# Patient Record
Sex: Female | Born: 1960 | Race: White | Hispanic: No | Smoking: Current every day smoker
Health system: Southern US, Community
[De-identification: ages and names within clinical notes are randomized; demographics above are authoritative.]

## PROBLEM LIST (undated history)

## (undated) DIAGNOSIS — G43909 Migraine, unspecified, not intractable, without status migrainosus: Secondary | ICD-10-CM

## (undated) DIAGNOSIS — I10 Essential (primary) hypertension: Secondary | ICD-10-CM

## (undated) DIAGNOSIS — E785 Hyperlipidemia, unspecified: Secondary | ICD-10-CM

## (undated) HISTORY — DX: Essential (primary) hypertension: I10

## (undated) HISTORY — DX: Migraine, unspecified, not intractable, without status migrainosus: G43.909

## (undated) HISTORY — DX: Hyperlipidemia, unspecified: E78.5

## (undated) HISTORY — PX: TUBAL LIGATION: SHX77

## (undated) HISTORY — PX: CHOLECYSTECTOMY: SHX55

---

## 1999-05-07 ENCOUNTER — Other Ambulatory Visit: Admission: RE | Admit: 1999-05-07 | Discharge: 1999-05-07 | Payer: Self-pay | Admitting: Family Medicine

## 2003-02-02 ENCOUNTER — Other Ambulatory Visit: Admission: RE | Admit: 2003-02-02 | Discharge: 2003-02-02 | Payer: Self-pay | Admitting: Family Medicine

## 2008-08-25 ENCOUNTER — Ambulatory Visit (HOSPITAL_COMMUNITY): Admission: RE | Admit: 2008-08-25 | Discharge: 2008-08-25 | Payer: Self-pay | Admitting: Family Medicine

## 2010-02-27 ENCOUNTER — Ambulatory Visit (HOSPITAL_COMMUNITY): Admission: RE | Admit: 2010-02-27 | Discharge: 2010-02-27 | Payer: Self-pay | Admitting: Family Medicine

## 2010-10-07 ENCOUNTER — Emergency Department (HOSPITAL_COMMUNITY): Payer: BC Managed Care – PPO

## 2010-10-07 ENCOUNTER — Observation Stay (HOSPITAL_COMMUNITY)
Admission: EM | Admit: 2010-10-07 | Discharge: 2010-10-08 | Disposition: A | Discharge status: Home / Self Care | Payer: BC Managed Care – PPO | Attending: Internal Medicine | Admitting: Internal Medicine

## 2010-10-07 DIAGNOSIS — F17200 Nicotine dependence, unspecified, uncomplicated: Secondary | ICD-10-CM | POA: Insufficient documentation

## 2010-10-07 DIAGNOSIS — E860 Dehydration: Secondary | ICD-10-CM | POA: Insufficient documentation

## 2010-10-07 DIAGNOSIS — R0602 Shortness of breath: Secondary | ICD-10-CM | POA: Insufficient documentation

## 2010-10-07 DIAGNOSIS — R0789 Other chest pain: Principal | ICD-10-CM | POA: Insufficient documentation

## 2010-10-07 DIAGNOSIS — Z8249 Family history of ischemic heart disease and other diseases of the circulatory system: Secondary | ICD-10-CM | POA: Insufficient documentation

## 2010-10-07 LAB — COMPREHENSIVE METABOLIC PANEL
AST: 20 U/L (ref 0–37)
BUN: 17 mg/dL (ref 6–23)
CO2: 24 mEq/L (ref 19–32)
Calcium: 9.2 mg/dL (ref 8.4–10.5)
Chloride: 109 mEq/L (ref 96–112)
Glucose, Bld: 101 mg/dL — ABNORMAL HIGH (ref 70–99)
Potassium: 3.9 mEq/L (ref 3.5–5.1)
Sodium: 140 mEq/L (ref 135–145)
Total Protein: 6.7 g/dL (ref 6.0–8.3)

## 2010-10-07 LAB — DIFFERENTIAL
Basophils Absolute: 0 10*3/uL (ref 0.0–0.1)
Eosinophils Absolute: 0.3 10*3/uL (ref 0.0–0.7)
Lymphocytes Relative: 33 % (ref 12–46)
Monocytes Absolute: 0.4 10*3/uL (ref 0.1–1.0)
Neutro Abs: 4.6 10*3/uL (ref 1.7–7.7)

## 2010-10-07 LAB — CBC
HCT: 42.6 % (ref 36.0–46.0)
MCHC: 34.7 g/dL (ref 30.0–36.0)

## 2010-10-07 LAB — POCT CARDIAC MARKERS: CKMB, poc: 1.5 ng/mL (ref 1.0–8.0)

## 2010-10-07 NOTE — H&P (Signed)
NAMEWILBURTA, [name]             ACCOUNT NO.:  [id number]  MEDICAL RECORD NO.:  [id number]           PATIENT TYPE:  E  LOCATION:  [facility]                          FACILITY:  [facility]  PHYSICIAN:  Talmage Nap, MD  DATE OF BIRTH:  1961-03-16  DATE OF ADMISSION:  10/07/2010 DATE OF DISCHARGE:  LH                             HISTORY & PHYSICAL   PRIMARY CARE PHYSICIAN:  Unassigned.  History is obtainable from the patient and the patient's son.  CHIEF COMPLAINT:  Chest pain, on and off for about 2 weeks' duration.  The patient is a 50 year old Caucasian female with no known medical history, but has a history of chronic tobacco use, presenting to the emergency room with chest pain, which according to the patient is being on and off for about 2 weeks.  This however got worse 24 hours prior to presenting to the emergency room.  The chest pain is described as pressure like, about 7/10 in intensity and located in the retrosternal region. This was said to be periodically radiating to the left arm.  The patient claims she was mildly short of breath.  She denied any history of nausea or vomiting.  No fever.  No chills or rigor.  She also denies any history of palpitation, PND or orthopnea.  Pain is made worse by movement and relieved by rest.  Pain was said to have persisted hence the patient presented to the emergency room to be evaluated.  PAST MEDICAL HISTORY:  None.  PAST SURGICAL HISTORY:  Tubal ligation and laparoscopic cholecystectomy.  MEDICATIONS:  She has no known preadmission med.  ALLERGIES:  IV dye.  SOCIAL HISTORY:  The patient smokes about a pack of cigarettes per day for the past 30 years.  Denies any history of alcohol use and works three jobs i.e. Science writer, CNA, and also in a parking industry.  FAMILY HISTORY:  Father had massive heart attack at a very early age, which he survived.  There is no family history of diabetes mellitus, hypertension, or  hyperlipidemia.  REVIEW OF SYSTEMS:  The patient denies any history of headaches.  No blurred vision.  Complained about dryness of the mouth.  No nausea or vomiting.  No fever with chills or rigor.  Chest pain is less in intensity but reproducible on the right side.  Denies any palpitation,PND or orthopnea.No cough. No abdominal discomfort.  No diarrhea or hematochezia.  No dysuria or hematuria.  No swelling of the lower extremity.  No intolerance to heat or cold.  No known psychiatric disorder.  PHYSICAL EXAMINATION:  GENERAL: , very pleasant young lady  dehydrated, not in any respiratory distress. VITAL SIGNS:  Blood pressure is 113/77, pulse is 69, respiratory rate is 22, temperature is 98.2. HEENT:  Pupils are reactive to light.  Extraocular muscles are intact. NECK:  No jugular venous distention.  No carotid bruit.  No lymphadenopathy. CHEST:  Clear to auscultation, but there is reproducible tenderness in the right hemithorax. HEART:  Sounds are 1 and 2.  No murmur. ABDOMEN:  Soft, nontender.  Liver, spleen, and kidney are not palpable.Bowel sounds are positive. EXTREMITIES:  No  pedal edema. NEUROLOGIC:  Nonfocal. MUSCULOSKELETAL SYSTEM:  Unremarkable. SKIN:  Shows slightly decreased turgor.  LAB DATA:  Initial hematological indices showed WBC of 8.0, hemoglobin of 14.8, hematocrit of 42.6, MCV of 88.2, with a platelet count of 248, normal differential.  Chemistries show sodium of 140, potassium of 3.9, chloride of 109, with a bicarb of 24, glucose is 101, BUN is 17, creatinine 0.76.  LFTs normal.  First set of cardiac markers, troponin-I less than 0.05.  Chest x-ray essentially normal.  EKG showed normal sinus rhythm with a rate of 73, no acute ST-T-wave changes noted.  IMPRESSION: 1. Chest pain most likely musculoskeletal since it is reproducible,     however, it is important to rule out acute coronary syndrome. 2. Dehydration. 3. Chronic tobacco use. 4. Family history  of early coronary artery disease.  PLAN:  To admit the patient to telemetry for observation.  The patient will be slowly hydrated with half-normal saline IV to go at a rate of 75 mL an hour.  She will be on aspirin 325 mg p.o. daily, nitroglycerin 0.4 mg sublingual p.r.n., and pain control be with morphine 2 mg IV q.4 p.r.n.  She will also be on O2 via nasal cannula three times per minute and she will be given nicotine patch 14 mg q.24 h. transdermal.  The patient will also be on Flexeril 10 mg p.o. nightly p.r.n. for chest pain and Protonix 40 mg p.o. daily for GI prophylaxis and Lovenox 40 mg subcu q. 24 for DVT prophylaxis.  Further labs to be ordered on this patient will include cardiac enzymes q.6 x3, lipid panel, 2-D echo, CBCD, CMP, and magnesium will be repeated in a.m.  Finally, if cardiac enzymes are negative, the patient could have Cardiolite stress test as an outpatient.  She will be followed and evaluated on daily basis.     Talmage Nap, MD     CN/MEDQ  D:  10/07/2010  T:  10/07/2010  Job:  [id number]  Electronically Signed by Talmage Nap  on 10/07/2010 11:39:55 PM

## 2010-10-08 LAB — CK TOTAL AND CKMB (NOT AT ARMC)
CK, MB: 2.2 ng/mL (ref 0.3–4.0)
Relative Index: INVALID (ref 0.0–2.5)
Total CK: 73 U/L (ref 7–177)

## 2010-10-08 LAB — MAGNESIUM: Magnesium: 2.1 mg/dL (ref 1.5–2.5)

## 2010-10-08 LAB — COMPREHENSIVE METABOLIC PANEL
AST: 23 U/L (ref 0–37)
Alkaline Phosphatase: 47 U/L (ref 39–117)
CO2: 22 mEq/L (ref 19–32)
Potassium: 4.2 mEq/L (ref 3.5–5.1)
Sodium: 140 mEq/L (ref 135–145)
Total Bilirubin: 0.3 mg/dL (ref 0.3–1.2)

## 2010-10-08 LAB — DIFFERENTIAL
Basophils Absolute: 0 10*3/uL (ref 0.0–0.1)
Basophils Relative: 1 % (ref 0–1)
Eosinophils Absolute: 0.3 10*3/uL (ref 0.0–0.7)
Lymphocytes Relative: 37 % (ref 12–46)
Lymphs Abs: 2.7 10*3/uL (ref 0.7–4.0)
Monocytes Relative: 5 % (ref 3–12)
Neutro Abs: 3.9 10*3/uL (ref 1.7–7.7)
Neutrophils Relative %: 53 % (ref 43–77)

## 2010-10-08 LAB — CBC
HCT: 40 % (ref 36.0–46.0)
Hemoglobin: 13.5 g/dL (ref 12.0–15.0)
MCH: 30 pg (ref 26.0–34.0)
MCV: 88.9 fL (ref 78.0–100.0)
RBC: 4.5 MIL/uL (ref 3.87–5.11)
WBC: 7.3 10*3/uL (ref 4.0–10.5)

## 2010-10-08 LAB — CARDIAC PANEL(CRET KIN+CKTOT+MB+TROPI)
CK, MB: 2.1 ng/mL (ref 0.3–4.0)
Relative Index: INVALID (ref 0.0–2.5)
Troponin I: 0.01 ng/mL (ref 0.00–0.06)

## 2010-10-08 LAB — LIPID PANEL
Cholesterol: 127 mg/dL (ref 0–200)
HDL: 24 mg/dL — ABNORMAL LOW (ref >39)
LDL Cholesterol: 64 mg/dL (ref 0–99)
VLDL: 39 mg/dL (ref 0–40)

## 2010-10-10 NOTE — Discharge Summary (Signed)
[name], [name]             ACCOUNT NO.:  [id number]  MEDICAL RECORD NO.:  [id number]           PATIENT TYPE:  O  LOCATION:  [facility]                          FACILITY:  [facility]  PHYSICIAN:  Elliot Cousin, M.D.    DATE OF BIRTH:  07-16-1960  DATE OF ADMISSION:  10/07/2010 DATE OF DISCHARGE:  04/02/2012LH                              DISCHARGE SUMMARY   DISCHARGE DIAGNOSES: 1. Chest pain, likely secondary to musculoskeletal pain, myocardial     infarction ruled out. 2. Tobacco abuse.  DISCHARGE MEDICATIONS: 1. Flexeril 10 mg 1 tablet b.i.d. p.r.n. for muscle spasm and pain. 2. Ibuprofen 200 mg 1-2 tablets every 6 hours as needed for pain. 3. Prilosec OTC 1 tablet daily. 4. Tylenol 325 mg 1-2 tablets every 6 hours as needed for pain.  DISCHARGE DISPOSITION:  The patient was discharged to home in improved and stable condition on October 08, 2010.  An appointment with the community physician on call, Dr. Renard Carr, will be arranged for the patient to follow up with him in 1-2 weeks.  CONSULTATIONS:  None.  PROCEDURE PERFORMED:  Chest x-ray on October 07, 2010.  The results revealed low lung volumes.  No significant abnormality identified.  HISTORY OF PRESENT ILLNESS:  The patient is a 50 year old woman with a past medical history significant for tobacco use, who presented to the emergency department on October 07, 2010 with a chief complaint of chest pain for approximately 2 weeks.  When she was initially evaluated, she was noted to be afebrile and hemodynamically stable, although mildly hypertensive.  Her chest x-ray revealed no acute cardiopulmonary abnormalities.  Her initial cardiac markers were negative.  Her EKG revealed normal sinus rhythm with a heart rate of 73 beats per minute and no abnormalities whatsoever.  She was admitted for further evaluation and management.  HOSPITAL COURSE:  The patient was started on p.r.n. morphine and p.r.n. Flexeril.  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  On exam, she did have some pectoris major tenderness upon palpation.  There was no associated edema or erythema.  In addition to the morphine and Flexeril, Protonix was added just in case she had a flare-up of gastroesophageal reflux disease.  She does admit to a history of heartburn and reflux, but takes over-the- counter Pepcid as needed.  Sublingual nitroglycerin was also ordered, however, was not needed.  A nicotine patch was placed as the patient smokes half a pack to a pack of cigarettes per day.  She was strongly advised to stop smoking.  Aspirin at 325 mg daily was added prophylactically, but was discontinued at the time of hospital discharge.  For further evaluation, cardiac enzymes, 2-D echocardiogram, and a fasting lipid panel were ordered.  All of her cardiac enzymes were normal and therefore she ruled out for myocardial infarction.  The results of the fasting lipid panel were pending at the time of hospital discharge.  The 2-D echocardiogram was cancelled as it appeared that the patient's pain was noncardiac in origin.  The patient remained hemodynamically stable.  Her blood pressure normalized to 126/78.  Her electrolyte  panel and CBC as well as her liver transaminases were all well within normal limits.  She was discharged home on as-needed Flexeril, as-needed ibuprofen, as-needed Tylenol, and Prilosec OTC daily.     Elliot Cousin, M.D.     DF/MEDQ  D:  10/08/2010  T:  10/09/2010  Job:  [id number]  cc:   Linda G. Renard Matter, MD Fax: [phone]  Electronically Signed by Elliot Cousin M.D. on 10/10/2010 05:12:47 PM

## 2012-10-14 ENCOUNTER — Telehealth: Payer: Self-pay | Admitting: Nurse Practitioner

## 2012-10-14 NOTE — Telephone Encounter (Signed)
Please advise 

## 2012-10-14 NOTE — Telephone Encounter (Signed)
Crestor sample ok if we have

## 2012-10-15 ENCOUNTER — Ambulatory Visit (INDEPENDENT_AMBULATORY_CARE_PROVIDER_SITE_OTHER): Payer: BC Managed Care – PPO | Admitting: Nurse Practitioner

## 2012-10-15 ENCOUNTER — Encounter: Payer: Self-pay | Admitting: Nurse Practitioner

## 2012-10-15 VITALS — BP 123/89 | HR 89 | Temp 97.9°F | Ht 60.0 in | Wt 132.0 lb

## 2012-10-15 DIAGNOSIS — J069 Acute upper respiratory infection, unspecified: Secondary | ICD-10-CM

## 2012-10-15 DIAGNOSIS — E785 Hyperlipidemia, unspecified: Secondary | ICD-10-CM

## 2012-10-15 DIAGNOSIS — G43909 Migraine, unspecified, not intractable, without status migrainosus: Secondary | ICD-10-CM | POA: Insufficient documentation

## 2012-10-15 DIAGNOSIS — I10 Essential (primary) hypertension: Secondary | ICD-10-CM

## 2012-10-15 MED ORDER — AMOXICILLIN 875 MG PO TABS: 875.0000 mg | ORAL_TABLET | Freq: Two times a day (BID) | ORAL | Status: DC

## 2012-10-15 MED ORDER — SUMATRIPTAN SUCCINATE 50 MG PO TABS: 50.0000 mg | ORAL_TABLET | ORAL | Status: DC | PRN

## 2012-10-15 MED ORDER — HYDROCODONE-HOMATROPINE 5-1.5 MG/5ML PO SYRP: 5.0000 mL | ORAL_SOLUTION | Freq: Three times a day (TID) | ORAL | Status: DC | PRN

## 2012-10-15 NOTE — Progress Notes (Signed)
  Subjective:    Patient ID: [name], female    DOB: 1961/03/14, 52 y.o.   MRN: [id number]  HPI-Patient in complaining of cough and congestion . Started 5week. Has gotten worse since started. Associated symptoms include Slight headache. He has tried Claritin , nyquil and dayquil OTC without relief. Cough all night last night at work.     Review of Systems  Constitutional: Positive for fatigue. Negative for fever and chills.  HENT: Positive for congestion, rhinorrhea and sinus pressure.   Respiratory: Positive for cough (nonproductive).   Cardiovascular: Negative.   Gastrointestinal: Negative.   Psychiatric/Behavioral: Negative.        Objective:   Physical Exam  Constitutional: She is oriented to person, place, and time. She appears well-developed and well-nourished.  HENT:  Head: Normocephalic.  Right Ear: External ear normal.  Left Ear: External ear normal.  Nose: Mucosal edema and rhinorrhea present.  Eyes: Pupils are equal, round, and reactive to light.  Neck: Normal range of motion. Neck supple. No thyromegaly present.  Cardiovascular: Normal rate, normal heart sounds and intact distal pulses.   Pulmonary/Chest: Effort normal. She has no wheezes. She has no rales.  Lymphadenopathy:    She has no cervical adenopathy.  Neurological: She is alert and oriented to person, place, and time.  Skin: Skin is warm and dry.  Psychiatric: She has a normal mood and affect. Her behavior is normal. Judgment and thought content normal.  BP 123/89  Pulse 89  Temp(Src) 97.9 F (36.6 C) (Oral)  Ht 5' (1.524 m)  Wt 132 lb (59.875 kg)  BMI 25.78 kg/m2  LMP 07/17/2012         Assessment & Plan:  Upper respiratory infection Amoxicillin as RX Hycodan cough meds 1. Take meds as prescribed 2. Use a cool mist humidifier especially during the winter months and when heat has  been humid. 3. Use saline nose sprays frequently 4. Saline irrigations of the nose can be very helpful  if done frequently.  * 4X daily for 1 week*  * Use of a nettie pot can be helpful with this. Follow directions with this* 5. Drink plenty of fluids 6. Keep thermostat turn down low 7.For any cough or congestion  Use plain Mucinex- regular strength or max strength is fine   * Children- consult with Pharmacist for dosing 8. For fever or aces or pains- take tylenol or ibuprofen appropriate for age and weight.  * for fevers greater than 101 orally you may alternate ibuprofen and tylenol every  3 hours.   Mary-Margaret Daphine Deutscher, FNP

## 2012-10-15 NOTE — Telephone Encounter (Signed)
Samples up front 

## 2012-10-15 NOTE — Patient Instructions (Signed)

## 2013-07-14 ENCOUNTER — Other Ambulatory Visit: Payer: Self-pay | Admitting: Nurse Practitioner

## 2013-07-14 ENCOUNTER — Telehealth: Payer: Self-pay | Admitting: Nurse Practitioner

## 2013-07-14 MED ORDER — LISINOPRIL 10 MG PO TABS: 10.0000 mg | ORAL_TABLET | Freq: Every day | ORAL | Status: DC

## 2013-07-14 NOTE — Telephone Encounter (Signed)
Please schedule

## 2013-07-14 NOTE — Telephone Encounter (Signed)
Needs appointment- just make one in the next few weeks and call and leave her message on voice mail as to date and time- thank you!

## 2013-07-15 NOTE — Telephone Encounter (Signed)
[facility] but [facility] number disconnected

## 2013-07-21 ENCOUNTER — Ambulatory Visit: Payer: BC Managed Care – PPO | Admitting: Nurse Practitioner

## 2013-10-01 ENCOUNTER — Encounter: Payer: Self-pay | Admitting: Family Medicine

## 2013-10-01 ENCOUNTER — Ambulatory Visit (INDEPENDENT_AMBULATORY_CARE_PROVIDER_SITE_OTHER): Payer: BC Managed Care – PPO | Admitting: Family Medicine

## 2013-10-01 VITALS — BP 142/92 | HR 78 | Temp 98.7°F | Ht 60.0 in | Wt 140.2 lb

## 2013-10-01 DIAGNOSIS — G43909 Migraine, unspecified, not intractable, without status migrainosus: Secondary | ICD-10-CM

## 2013-10-01 MED ORDER — SUMATRIPTAN SUCCINATE 100 MG PO TABS: 100.0000 mg | ORAL_TABLET | ORAL | Status: DC | PRN

## 2013-10-01 MED ORDER — KETOROLAC TROMETHAMINE 30 MG/ML IJ SOLN: 30.0000 mg | Freq: Once | INTRAMUSCULAR | Status: AC

## 2013-10-01 MED ORDER — PROMETHAZINE HCL 25 MG PO TABS: 25.0000 mg | ORAL_TABLET | Freq: Three times a day (TID) | ORAL | Status: DC | PRN

## 2013-10-01 MED ORDER — KETOROLAC TROMETHAMINE 60 MG/2ML IM SOLN
30.0000 mg | Freq: Once | INTRAMUSCULAR | Status: AC
  Administered 2013-10-01: 30 mg via INTRAMUSCULAR

## 2013-10-01 NOTE — Progress Notes (Signed)
   Subjective:    Patient ID: [name], female    DOB: 06-Nov-1960, 53 y.o.   MRN: [id number]  HPI  This 53 y.o. female presents for evaluation of migraine headache today and had to go home early from work.  Review of Systems C/o headache   No chest pain, SOB, HA, dizziness, vision change, N/V, diarrhea, constipation, dysuria, urinary urgency or frequency, myalgias, arthralgias or rash.  Objective:   Physical Exam  Vital signs noted  Well developed well nourished female.  HEENT - Head atraumatic Normocephalic                Eyes - PERRLA, Conjuctiva - clear Sclera- Clear EOMI                Ears - EAC's Wnl TM's Wnl Gross Hearing WNL                Throat - oropharanx wnl Respiratory - Lungs CTA bilateral Cardiac - RRR S1 and S2 without murmur GI - Abdomen soft Nontender and bowel sounds active x 4 Extremities - No edema. Neuro - Grossly intact.      Assessment & Plan:  Migraine - Plan: ketorolac (TORADOL) 30 MG/ML injection 30 mg, SUMAtriptan (IMITREX) 100 MG tablet, promethazine (PHENERGAN) 25 MG tablet, ketorolac (TORADOL) 30 MG/ML injection 30 mg, ketorolac (TORADOL) injection 30 mg  Deatra CanterWilliam J Kameko Hukill FNP

## 2013-11-23 ENCOUNTER — Ambulatory Visit (INDEPENDENT_AMBULATORY_CARE_PROVIDER_SITE_OTHER): Payer: BC Managed Care – PPO | Admitting: Family

## 2013-11-23 VITALS — BP 126/88 | HR 76 | Temp 98.1°F | Ht 60.0 in | Wt 138.0 lb

## 2013-11-23 DIAGNOSIS — N760 Acute vaginitis: Secondary | ICD-10-CM

## 2013-11-23 DIAGNOSIS — B3731 Acute candidiasis of vulva and vagina: Secondary | ICD-10-CM

## 2013-11-23 DIAGNOSIS — N939 Abnormal uterine and vaginal bleeding, unspecified: Secondary | ICD-10-CM

## 2013-11-23 DIAGNOSIS — B373 Candidiasis of vulva and vagina: Secondary | ICD-10-CM

## 2013-11-23 DIAGNOSIS — A499 Bacterial infection, unspecified: Secondary | ICD-10-CM

## 2013-11-23 DIAGNOSIS — B9689 Other specified bacterial agents as the cause of diseases classified elsewhere: Secondary | ICD-10-CM

## 2013-11-23 DIAGNOSIS — N898 Other specified noninflammatory disorders of vagina: Secondary | ICD-10-CM

## 2013-11-23 LAB — POCT WET PREP WITH KOH
Clue Cells Wet Prep HPF POC: NEGATIVE
KOH PREP POC: POSITIVE
TRICHOMONAS UA: NEGATIVE

## 2013-11-23 MED ORDER — METRONIDAZOLE 500 MG PO TABS: 500.0000 mg | ORAL_TABLET | Freq: Two times a day (BID) | ORAL | Status: DC

## 2013-11-23 MED ORDER — BUTOCONAZOLE NITRATE (1 DOSE) 2 % VA CREA: 5.0000 g | TOPICAL_CREAM | Freq: Every day | VAGINAL | Status: DC

## 2013-11-23 NOTE — Progress Notes (Signed)
   Subjective:    Patient ID: Linda ReiningMichelle A Covel, female    DOB: April 10, 1961, 53 y.o.   MRN: [id number]  Vaginal Bleeding The patient's primary symptoms include vaginal bleeding. The patient's pertinent negatives include no genital itching, genital lesions, genital odor, pelvic pain or vaginal discharge. This is a new problem. Episode onset: two weeks ago. The problem occurs constantly ("light bleediing everyday for the last two weeks"). The problem has been unchanged. The patient is experiencing no pain. She is not pregnant. Pertinent negatives include no abdominal pain, discolored urine, fever or vomiting. The vaginal discharge was bloody ("pink"). The vaginal bleeding is lighter than menses. She has not been passing clots. Nothing aggravates the symptoms. She has tried nothing for the symptoms. She is not sexually active. Her menstrual history has been irregular. Her past medical history is significant for endometriosis. There is no history of an abdominal surgery or an STD.      Review of Systems  Constitutional: Negative for fever.  HENT: Negative.   Respiratory: Negative.   Cardiovascular: Negative.   Gastrointestinal: Negative for vomiting and abdominal pain.  Genitourinary: Positive for vaginal bleeding. Negative for vaginal discharge and pelvic pain.  Musculoskeletal: Negative.   Neurological: Negative.   Hematological: Negative.   Psychiatric/Behavioral: Negative.   All other systems reviewed and are negative.      Objective:   Physical Exam  Constitutional: She is oriented to person, place, and time. She appears well-developed and well-nourished.  Cardiovascular: Normal rate, regular rhythm, normal heart sounds and intact distal pulses.   No murmur heard. Pulmonary/Chest: Effort normal and breath sounds normal. No respiratory distress. She has no wheezes.  Abdominal: Soft. Bowel sounds are normal. She exhibits no distension. There is no tenderness.  Musculoskeletal: Normal  range of motion.  Neurological: She is alert and oriented to person, place, and time.  Skin: Skin is warm and dry. No erythema.  Psychiatric: She has a normal mood and affect. Her behavior is normal. Judgment and thought content normal.   Results for orders placed in visit on 11/23/13  POCT WET PREP WITH KOH      Result Value Ref Range   Trichomonas, UA Negative     Clue Cells Wet Prep HPF POC neg     Epithelial Wet Prep HPF POC mod     Yeast Wet Prep HPF POC mod     Bacteria Wet Prep HPF POC mod     RBC Wet Prep HPF POC 1-5     WBC Wet Prep HPF POC 5-10     KOH Prep POC Positive        BP 126/88  Pulse 76  Temp(Src) 98.1 F (36.7 C) (Oral)  Ht 5' (1.524 m)  Wt 138 lb (62.596 kg)  BMI 26.95 kg/m2  LMP 07/17/2012     Assessment & Plan:  1. Vaginal bleeding - POCT Wet Prep with KOH  2. Bacterial vaginosis -Keep vagina clean and dry -Take rx as prescribed - metroNIDAZOLE (FLAGYL) 500 MG tablet; Take 1 tablet (500 mg total) by mouth 2 (two) times daily.  Dispense: 14 tablet; Refill: 0  3. Candidiasis of vagina -Keep area clean and dry -Wear cotton underwear - Butoconazole Nitrate, 1 Dose, 2 % CREA; Place 5 g vaginally at bedtime.  Dispense: 15 g; Refill: 2  Jannifer Rodneyhristy Sydney Azure, FNP

## 2013-11-23 NOTE — Patient Instructions (Addendum)
Monilial Vaginitis Vaginitis in a soreness, swelling and redness (inflammation) of the vagina and vulva. Monilial vaginitis is not a sexually transmitted infection. CAUSES  Yeast vaginitis is caused by yeast (candida) that is normally found in your vagina. With a yeast infection, the candida has overgrown in number to a point that upsets the chemical balance. SYMPTOMS   White, thick vaginal discharge.  Swelling, itching, redness and irritation of the vagina and possibly the lips of the vagina (vulva).  Burning or painful urination.  Painful intercourse. DIAGNOSIS  Things that may contribute to monilial vaginitis are:  Postmenopausal and virginal states.  Pregnancy.  Infections.  Being tired, sick or stressed, especially if you had monilial vaginitis in the past.  Diabetes. Good control will help lower the chance.  Birth control pills.  Tight fitting garments.  Using bubble bath, feminine sprays, douches or deodorant tampons.  Taking certain medications that kill germs (antibiotics).  Sporadic recurrence can occur if you become ill. TREATMENT  Your caregiver will give you medication.  There are several kinds of anti monilial vaginal creams and suppositories specific for monilial vaginitis. For recurrent yeast infections, use a suppository or cream in the vagina 2 times a week, or as directed.  Anti-monilial or steroid cream for the itching or irritation of the vulva may also be used. Get your caregiver's permission.  Painting the vagina with methylene blue solution may help if the monilial cream does not work.  Eating yogurt may help prevent monilial vaginitis. HOME CARE INSTRUCTIONS   Finish all medication as prescribed.  Do not have sex until treatment is completed or after your caregiver tells you it is okay.  Take warm sitz baths.  Do not douche.  Do not use tampons, especially scented ones.  Wear cotton underwear.  Avoid tight pants and panty  hose.  Tell your sexual partner that you have a yeast infection. They should go to their caregiver if they have symptoms such as mild rash or itching.  Your sexual partner should be treated as well if your infection is difficult to eliminate.  Practice safer sex. Use condoms.  Some vaginal medications cause latex condoms to fail. Vaginal medications that harm condoms are:  Cleocin cream.  Butoconazole (Femstat).  Terconazole (Terazol) vaginal suppository.  Miconazole (Monistat) (may be purchased over the counter). SEEK MEDICAL CARE IF:   You have a temperature by mouth above 102 F (38.9 C).  The infection is getting worse after 2 days of treatment.  The infection is not getting better after 3 days of treatment.  You develop blisters in or around your vagina.  You develop vaginal bleeding, and it is not your menstrual period.  You have pain when you urinate.  You develop intestinal problems.  You have pain with sexual intercourse. Document Released: 04/03/2005 Document Revised: 09/16/2011 Document Reviewed: 12/16/2008 ExitCare Patient Information 2014 [employer]. Bacterial Vaginosis Bacterial vaginosis is a vaginal infection that occurs when the normal balance of bacteria in the vagina is disrupted. It results from an overgrowth of certain bacteria. This is the most common vaginal infection in women of childbearing age. Treatment is important to prevent complications, especially in pregnant women, as it can cause a premature delivery. CAUSES  Bacterial vaginosis is caused by an increase in harmful bacteria that are normally present in smaller amounts in the vagina. Several different kinds of bacteria can cause bacterial vaginosis. However, the reason that the condition develops is not fully understood. RISK FACTORS Certain activities or behaviors can put   you at an increased risk of developing bacterial vaginosis, including:  Having a new sex partner or multiple sex  partners.  Douching.  Using an intrauterine device (IUD) for contraception. Women do not get bacterial vaginosis from toilet seats, bedding, swimming pools, or contact with objects around them. SIGNS AND SYMPTOMS  Some women with bacterial vaginosis have no signs or symptoms. Common symptoms include:  Grey vaginal discharge.  A fishlike odor with discharge, especially after sexual intercourse.  Itching or burning of the vagina and vulva.  Burning or pain with urination. DIAGNOSIS  Your health care provider will take a medical history and examine the vagina for signs of bacterial vaginosis. A sample of vaginal fluid may be taken. Your health care provider will look at this sample under a microscope to check for bacteria and abnormal cells. A vaginal pH test may also be done.  TREATMENT  Bacterial vaginosis may be treated with antibiotic medicines. These may be given in the form of a pill or a vaginal cream. A second round of antibiotics may be prescribed if the condition comes back after treatment.  HOME CARE INSTRUCTIONS   Only take over-the-counter or prescription medicines as directed by your health care provider.  If antibiotic medicine was prescribed, take it as directed. Make sure you finish it even if you start to feel better.  Do not have sex until treatment is completed.  Tell all sexual partners that you have a vaginal infection. They should see their health care provider and be treated if they have problems, such as a mild rash or itching.  Practice safe sex by using condoms and only having one sex partner. SEEK MEDICAL CARE IF:   Your symptoms are not improving after 3 days of treatment.  You have increased discharge or pain.  You have a fever. MAKE SURE YOU:   Understand these instructions.  Will watch your condition.  Will get help right away if you are not doing well or get worse. FOR MORE INFORMATION  Centers for Disease Control and Prevention, Division of  STD Prevention: www.cdc.gov/std American Sexual Health Association (ASHA): www.ashastd.org  Document Released: 06/24/2005 Document Revised: 04/14/2013 Document Reviewed: 02/03/2013 Exit[employer] Patient Information 2014 [employer].  

## 2013-11-24 ENCOUNTER — Telehealth: Payer: Self-pay | Admitting: Nurse Practitioner

## 2013-11-25 NOTE — Telephone Encounter (Signed)
, °

## 2013-11-26 ENCOUNTER — Other Ambulatory Visit: Payer: Self-pay | Admitting: Family

## 2013-11-26 MED ORDER — FLUCONAZOLE 150 MG PO TABS: 150.0000 mg | ORAL_TABLET | Freq: Once | ORAL | Status: DC

## 2014-12-09 ENCOUNTER — Ambulatory Visit (INDEPENDENT_AMBULATORY_CARE_PROVIDER_SITE_OTHER): Payer: BLUE CROSS/BLUE SHIELD | Admitting: Family

## 2014-12-09 ENCOUNTER — Encounter: Payer: Self-pay | Admitting: Family

## 2014-12-09 VITALS — BP 125/85 | HR 80 | Temp 98.9°F | Ht 60.0 in | Wt 125.0 lb

## 2014-12-09 DIAGNOSIS — Z0000 Encounter for general adult medical examination without abnormal findings: Secondary | ICD-10-CM | POA: Diagnosis not present

## 2014-12-09 DIAGNOSIS — E785 Hyperlipidemia, unspecified: Secondary | ICD-10-CM

## 2014-12-09 DIAGNOSIS — I10 Essential (primary) hypertension: Secondary | ICD-10-CM

## 2014-12-09 NOTE — Patient Instructions (Signed)

## 2014-12-09 NOTE — Progress Notes (Signed)
   Subjective:    Patient ID: [name], female    DOB: 1960/11/13, 54 y.o.   MRN: [id number]  Pt presents to the office today for CPE. Pt states she has had HTN and hyperlipidemia in the past, but pt does not currently take any medications at this time.  Hypertension This is a chronic problem. The current episode started more than 1 year ago. The problem is unchanged. The problem is uncontrolled. Pertinent negatives include no anxiety, headaches, palpitations, peripheral edema or shortness of breath. Risk factors for coronary artery disease include dyslipidemia, post-menopausal state and family history. Past treatments include nothing. The current treatment provides no improvement. There is no history of kidney disease, CAD/MI, CVA, heart failure or a thyroid problem. There is no history of sleep apnea.  Hyperlipidemia This is a chronic problem. The current episode started more than 1 year ago. The problem is uncontrolled. Recent lipid tests were reviewed and are high. She has no history of diabetes or hypothyroidism. Pertinent negatives include no leg pain or shortness of breath. Current antihyperlipidemic treatment includes statins. The current treatment provides significant improvement of lipids. Risk factors for coronary artery disease include dyslipidemia, hypertension and post-menopausal.       Review of Systems  Constitutional: Negative.   HENT: Negative.   Eyes: Negative.   Respiratory: Negative.  Negative for shortness of breath.   Cardiovascular: Negative.  Negative for palpitations.  Gastrointestinal: Negative.   Endocrine: Negative.   Genitourinary: Negative.   Musculoskeletal: Negative.   Neurological: Negative.  Negative for headaches.  Hematological: Negative.   Psychiatric/Behavioral: Negative.   All other systems reviewed and are negative.      Objective:   Physical Exam  Constitutional: She is oriented to person, place, and time. She appears well-developed  and well-nourished. No distress.  HENT:  Head: Normocephalic and atraumatic.  Right Ear: External ear normal.  Left Ear: External ear normal.  Nose: Nose normal.  Mouth/Throat: Oropharynx is clear and moist.  Eyes: Pupils are equal, round, and reactive to light.  Neck: Normal range of motion. Neck supple. No thyromegaly present.  Cardiovascular: Normal rate, regular rhythm, normal heart sounds and intact distal pulses.   No murmur heard. Pulmonary/Chest: Effort normal and breath sounds normal. No respiratory distress. She has no wheezes.  Abdominal: Soft. Bowel sounds are normal. She exhibits no distension. There is no tenderness.  Musculoskeletal: Normal range of motion. She exhibits no edema or tenderness.  Neurological: She is alert and oriented to person, place, and time. She has normal reflexes. No cranial nerve deficit.  Skin: Skin is warm and dry.  Psychiatric: She has a normal mood and affect. Her behavior is normal. Judgment and thought content normal.  Vitals reviewed.    BP 136/91 mmHg  Pulse 94  Temp(Src) 98.9 F (37.2 C) (Oral)  Ht 5' (1.524 m)  Wt 125 lb (56.7 kg)  BMI 24.41 kg/m2  LMP 07/17/2012      Assessment & Plan:  1. Essential hypertension, benign - CMP14+EGFR; Future  2. Hyperlipidemia with target LDL less than 100 - CMP14+EGFR; Future - Lipid panel; Future  3. Annual physical exam - CMP14+EGFR; Future - Thyroid Panel With TSH; Future - Vit D  25 hydroxy (rtn osteoporosis monitoring); Future - Lipid panel; Future   Continue all meds Labs pending Health Maintenance reviewed Diet and exercise encouraged RTO 1 year  Evelina Dun, FNP

## 2014-12-10 ENCOUNTER — Other Ambulatory Visit: Payer: BLUE CROSS/BLUE SHIELD

## 2014-12-10 DIAGNOSIS — E785 Hyperlipidemia, unspecified: Secondary | ICD-10-CM

## 2014-12-10 DIAGNOSIS — I10 Essential (primary) hypertension: Secondary | ICD-10-CM

## 2014-12-10 DIAGNOSIS — Z0000 Encounter for general adult medical examination without abnormal findings: Secondary | ICD-10-CM

## 2014-12-10 NOTE — Addendum Note (Signed)
Addended by: Bearl MulberryUTHERFORD, NATALIE K on: 12/10/2014 08:29 AM   Modules accepted: Kipp BroodSmartSet

## 2014-12-12 ENCOUNTER — Other Ambulatory Visit: Payer: Self-pay | Admitting: Family

## 2014-12-12 DIAGNOSIS — E559 Vitamin D deficiency, unspecified: Secondary | ICD-10-CM

## 2014-12-12 LAB — CMP14+EGFR
ALBUMIN: 4.2 g/dL (ref 3.5–5.5)
ALT: 21 IU/L (ref 0–32)
AST: 19 IU/L (ref 0–40)
Albumin/Globulin Ratio: 2 (ref 1.1–2.5)
Alkaline Phosphatase: 81 IU/L (ref 39–117)
BUN/Creatinine Ratio: 12 (ref 9–23)
BUN: 9 mg/dL (ref 6–24)
CO2: 19 mmol/L (ref 18–29)
Calcium: 9.2 mg/dL (ref 8.7–10.2)
Chloride: 105 mmol/L (ref 97–108)
Creatinine, Ser: 0.78 mg/dL (ref 0.57–1.00)
GFR calc Af Amer: 101 mL/min/{1.73_m2} (ref >59)
GFR, EST NON AFRICAN AMERICAN: 88 mL/min/{1.73_m2} (ref >59)
GLOBULIN, TOTAL: 2.1 g/dL (ref 1.5–4.5)
Glucose: 96 mg/dL (ref 65–99)
POTASSIUM: 3.6 mmol/L (ref 3.5–5.2)
Sodium: 146 mmol/L — ABNORMAL HIGH (ref 134–144)
Total Protein: 6.3 g/dL (ref 6.0–8.5)

## 2014-12-12 LAB — LIPID PANEL
CHOLESTEROL TOTAL: 142 mg/dL (ref 100–199)
Chol/HDL Ratio: 4.6 ratio units — ABNORMAL HIGH (ref 0.0–4.4)
HDL: 31 mg/dL — ABNORMAL LOW (ref >39)
LDL CALC: 91 mg/dL (ref 0–99)
Triglycerides: 100 mg/dL (ref 0–149)
VLDL CHOLESTEROL CAL: 20 mg/dL (ref 5–40)

## 2014-12-12 LAB — THYROID PANEL WITH TSH
Free Thyroxine Index: 2.4 (ref 1.2–4.9)
T3 Uptake Ratio: 25 % (ref 24–39)
T4, Total: 9.5 ug/dL (ref 4.5–12.0)
TSH: 0.825 u[IU]/mL (ref 0.450–4.500)

## 2014-12-12 LAB — VITAMIN D 25 HYDROXY (VIT D DEFICIENCY, FRACTURES): Vit D, 25-Hydroxy: 26.2 ng/mL — ABNORMAL LOW (ref 30.0–100.0)

## 2014-12-12 MED ORDER — VITAMIN D (ERGOCALCIFEROL) 1.25 MG (50000 UNIT) PO CAPS: 50000.0000 [IU] | ORAL_CAPSULE | ORAL | Status: DC

## 2014-12-15 ENCOUNTER — Encounter: Payer: Self-pay | Admitting: *Deleted

## 2015-06-13 ENCOUNTER — Encounter: Payer: Self-pay | Admitting: Family Medicine

## 2015-06-13 ENCOUNTER — Encounter: Payer: Self-pay | Admitting: *Deleted

## 2015-06-13 ENCOUNTER — Ambulatory Visit (INDEPENDENT_AMBULATORY_CARE_PROVIDER_SITE_OTHER): Payer: BLUE CROSS/BLUE SHIELD | Admitting: Family Medicine

## 2015-06-13 VITALS — BP 133/88 | HR 112 | Temp 100.3°F | Ht 60.0 in | Wt 127.0 lb

## 2015-06-13 DIAGNOSIS — J209 Acute bronchitis, unspecified: Secondary | ICD-10-CM

## 2015-06-13 DIAGNOSIS — R52 Pain, unspecified: Secondary | ICD-10-CM | POA: Diagnosis not present

## 2015-06-13 DIAGNOSIS — J029 Acute pharyngitis, unspecified: Secondary | ICD-10-CM

## 2015-06-13 DIAGNOSIS — B349 Viral infection, unspecified: Secondary | ICD-10-CM

## 2015-06-13 DIAGNOSIS — R509 Fever, unspecified: Secondary | ICD-10-CM

## 2015-06-13 DIAGNOSIS — J069 Acute upper respiratory infection, unspecified: Secondary | ICD-10-CM | POA: Diagnosis not present

## 2015-06-13 LAB — POCT RAPID STREP A (OFFICE): Rapid Strep A Screen: NEGATIVE

## 2015-06-13 LAB — POCT INFLUENZA A/B
Influenza A, POC: NEGATIVE
Influenza B, POC: NEGATIVE

## 2015-06-13 MED ORDER — AMOXICILLIN-POT CLAVULANATE 875-125 MG PO TABS: 1.0000 | ORAL_TABLET | Freq: Two times a day (BID) | ORAL | Status: DC

## 2015-06-13 MED ORDER — HYDROCOD POLST-CPM POLST ER 10-8 MG/5ML PO SUER: 5.0000 mL | Freq: Every evening | ORAL | Status: DC | PRN

## 2015-06-13 NOTE — Addendum Note (Signed)
Addended by: Magdalene RiverBULLINS, JAMIE H on: 06/13/2015 03:19 PM   Modules accepted: Orders

## 2015-06-13 NOTE — Progress Notes (Signed)
Subjective:    Patient ID: [name], female    DOB: 1961-05-24, 54 y.o.   MRN: [id number]  HPI Patient here today for cough, congestion, body aches and sore throat that started yesterday. The patient has a lot of symptoms primarily related to her respiratory tract which include fever or chills congestion and ear pain sore throat sinus pressure and cough headache and myalgias. This started yesterday at lunch. This onset was sudden. She does not know of anybody in her family or anybody that she has been around has had problems. The sputum that she is coughing up is green in color. She did have one episode of nausea and vomiting.     Patient Active Problem List   Diagnosis Date Noted  . Vitamin D deficiency 12/12/2014  . Migraines 10/15/2012  . Essential hypertension, benign 10/15/2012  . Hyperlipidemia with target LDL less than 100 10/15/2012   Outpatient Encounter Prescriptions as of 06/13/2015  Medication Sig  . Vitamin D, Ergocalciferol, (DRISDOL) 50000 UNITS CAPS capsule Take 1 capsule (50,000 Units total) by mouth every 7 (seven) days.   No facility-administered encounter medications on file as of 06/13/2015.      Review of Systems  Constitutional: Positive for fever and chills.  HENT: Positive for congestion, ear pain (right ear sore), sinus pressure and sore throat.   Eyes: Negative.   Respiratory: Positive for cough.   Cardiovascular: Negative.   Gastrointestinal: Negative.   Endocrine: Negative.   Genitourinary: Negative.   Musculoskeletal: Positive for myalgias.  Skin: Negative.   Allergic/Immunologic: Negative.   Neurological: Positive for headaches.  Hematological: Negative.   Psychiatric/Behavioral: Negative.        Objective:   Physical Exam  Constitutional: She is oriented to person, place, and time. She appears well-developed and well-nourished. She appears distressed.  The patient feels that is curled up on the table before I came in the room.    HENT:  Head: Normocephalic and atraumatic.  Right Ear: External ear normal.  Left Ear: External ear normal.  Nose: Nose normal.  The throat is red posteriorly bilaterally she has small anterior cervical adenopathy on the left. Both TMs are normal.  Eyes: Conjunctivae and EOM are normal. Pupils are equal, round, and reactive to light. Right eye exhibits no discharge. Left eye exhibits no discharge. No scleral icterus.  Neck: Normal range of motion. Neck supple. No thyromegaly present.  Cardiovascular: Normal rate, regular rhythm and normal heart sounds.  Exam reveals no friction rub.   No murmur heard. The heart has a regular rate and rhythm at 96/m  Pulmonary/Chest: Effort normal and breath sounds normal. No respiratory distress. She has no wheezes. She has no rales. She exhibits no tenderness.  There are good breath sounds but rhonchi with coughing bilaterally  Abdominal: Soft. Bowel sounds are normal. She exhibits no mass. There is tenderness. There is no rebound and no guarding.  The abdomen is tender in the right upper quadrant area with no liver or spleen enlargement  Musculoskeletal: Normal range of motion. She exhibits no edema or tenderness.  Lymphadenopathy:    She has cervical adenopathy.  Neurological: She is alert and oriented to person, place, and time.  Skin: Skin is warm and dry. No rash noted.  Psychiatric: She has a normal mood and affect. Her behavior is normal. Judgment and thought content normal.  Nursing note and vitals reviewed.   BP 133/88 mmHg  Pulse 112  Temp(Src) 100.3 F (37.9 C) (Oral)  Ht  5' (1.524 m)  Wt 127 lb (57.607 kg)  BMI 24.80 kg/m2  LMP 07/17/2012  Results for orders placed or performed in visit on 06/13/15  POCT Influenza A/B  Result Value Ref Range   Influenza A, POC Negative Negative   Influenza B, POC Negative Negative  POCT rapid strep A  Result Value Ref Range   Rapid Strep A Screen Negative Negative         Assessment &  Plan:  1. Fever, unspecified fever cause -Take Tylenol or ibuprofen as needed for aches pains and fever - POCT Influenza A/B - POCT rapid strep A - Culture, Group A Strep  2. Body aches -Take Tylenol and Motrin as directed - POCT Influenza A/B - POCT rapid strep A - Culture, Group A Strep  3. Sore throat -Take antibiotic as prescribed - POCT Influenza A/B - POCT rapid strep A - Culture, Group A Strep  4. Viral syndrome -Rest fluids fever control  5. Acute upper respiratory infection -Take cough medicine and drink plenty of fluids  6. Acute bronchitis, unspecified organism -Take cough medicine and antibiotic  Meds ordered this encounter  Medications  . amoxicillin-clavulanate (AUGMENTIN) 875-125 MG tablet    Sig: Take 1 tablet by mouth 2 (two) times daily.    Dispense:  20 tablet    Refill:  0   Patient Instructions  Drink plenty of fluids and stay well hydrated Take Mucinex maximum strength, blue and white in color, 1 twice daily for cough and congestion with a full glass of water Use nasal saline frequently in each nostril Take Tylenol alternating with ibuprofen as needed for aches pains and fever Take prescribed medicine as directed   Nyra Capeson W. Jodeen Mclin MD

## 2015-06-13 NOTE — Patient Instructions (Signed)
Drink plenty of fluids and stay well hydrated Take Mucinex maximum strength, blue and white in color, 1 twice daily for cough and congestion with a full glass of water Use nasal saline frequently in each nostril Take Tylenol alternating with ibuprofen as needed for aches pains and fever Take prescribed medicine as directed

## 2015-06-13 NOTE — Addendum Note (Signed)
Addended by: Orma RenderHODGES, Arliene Rosenow F on: 06/13/2015 06:04 PM   Modules accepted: Orders

## 2015-07-11 ENCOUNTER — Telehealth: Payer: Self-pay | Admitting: Family

## 2016-02-09 ENCOUNTER — Encounter: Payer: Self-pay | Admitting: Nurse Practitioner

## 2016-02-09 ENCOUNTER — Encounter (INDEPENDENT_AMBULATORY_CARE_PROVIDER_SITE_OTHER): Payer: Self-pay

## 2016-02-09 ENCOUNTER — Ambulatory Visit (INDEPENDENT_AMBULATORY_CARE_PROVIDER_SITE_OTHER): Payer: BLUE CROSS/BLUE SHIELD | Admitting: Nurse Practitioner

## 2016-02-09 VITALS — BP 132/84 | HR 75 | Temp 98.3°F | Ht 60.0 in | Wt 129.0 lb

## 2016-02-09 DIAGNOSIS — N9089 Other specified noninflammatory disorders of vulva and perineum: Secondary | ICD-10-CM

## 2016-02-09 NOTE — Addendum Note (Signed)
Addended by: Bennie Pierini on: 02/09/2016 05:58 PM   Modules accepted: Orders

## 2016-02-09 NOTE — Progress Notes (Addendum)
   Subjective:    Patient ID: [name], female    DOB: [birth year], 55 y.o.   MRN: [id number]  HPI Patient in today c/o of a bump on her bottom- noticed it last nigh nad it was just a red area- then this morning it turned black and is draining- denies any pain.    Review of Systems  Constitutional: Negative.   HENT: Negative.   Respiratory: Negative.   Cardiovascular: Negative.   Genitourinary: Negative.   Neurological: Negative.   Psychiatric/Behavioral: Negative.   All other systems reviewed and are negative.      Objective:   Physical Exam  Constitutional: She is oriented to person, place, and time. She appears well-developed and well-nourished.  Cardiovascular: Normal rate, regular rhythm and normal heart sounds.   Pulmonary/Chest: Effort normal and breath sounds normal.  Genitourinary:  Genitourinary Comments: Skin tags noted on left labia 1cm flesh colored lesion - nontender- on left lower labia majoria  Neurological: She is alert and oriented to person, place, and time.  Skin: Skin is warm.  Psychiatric: She has a normal mood and affect. Her behavior is normal. Judgment and thought content normal.   BP 132/84 (BP Location: Left Arm)   Pulse 75   Temp 98.3 F (36.8 C) (Oral)   Ht 5' (1.524 m)   Wt 129 lb (58.5 kg)   LMP 07/17/2012   BMI 25.19 kg/m          Assessment & Plan:   1. Labial lesion    Do not pick or scratch  Orders Placed This Encounter  Procedures  . Herpes simplex virus culture    Order Specific Question:   Source    Answer:   lesion    Will call with test results RTO prn  Mary-Margaret Daphine Deutscher, FNP

## 2016-02-12 LAB — HERPES SIMPLEX VIRUS CULTURE

## 2016-06-25 ENCOUNTER — Ambulatory Visit (INDEPENDENT_AMBULATORY_CARE_PROVIDER_SITE_OTHER): Payer: BLUE CROSS/BLUE SHIELD | Admitting: Nurse Practitioner

## 2016-06-25 ENCOUNTER — Encounter: Payer: Self-pay | Admitting: Nurse Practitioner

## 2016-06-25 VITALS — BP 134/91 | HR 92 | Temp 98.1°F | Ht 60.0 in | Wt 129.0 lb

## 2016-06-25 DIAGNOSIS — R222 Localized swelling, mass and lump, trunk: Secondary | ICD-10-CM | POA: Diagnosis not present

## 2016-06-25 NOTE — Progress Notes (Signed)
   Subjective:    Patient ID: Linda ReiningMichelle A Dauber, female    DOB: Oct 19, 1960, 55 y.o.   MRN: [id number]  HPI Patient has a Pump on her buttocks and she thinks that it is getting bigger. Would like it looked at again.    Review of Systems  Constitutional: Negative.   HENT: Negative.   Respiratory: Negative.   Cardiovascular: Negative.   Genitourinary: Negative.   Neurological: Negative.   Psychiatric/Behavioral: Negative.   All other systems reviewed and are negative.      Objective:   Physical Exam  Constitutional: She is oriented to person, place, and time. She appears well-developed and well-nourished. No distress.  Cardiovascular: Normal rate and regular rhythm.   Pulmonary/Chest: Effort normal and breath sounds normal.  Neurological: She is alert and oriented to person, place, and time.  Skin: Skin is warm.  1 cm erythematous lesion on left buttocks  Psychiatric: She has a normal mood and affect. Her behavior is normal. Judgment and thought content normal.    BP (!) 134/91   Pulse 92   Temp 98.1 F (36.7 C) (Oral)   Ht 5' (1.524 m)   Wt 129 lb (58.5 kg)   LMP 07/17/2012   BMI 25.19 kg/m   Cryotherapy to left buttocks lesion     Assessment & Plan:   1. Buttocks nodule    Cryotherapy to area Keep clean and dry Watch for signs of infection RTO prn  Mary-Margaret Daphine DeutscherMartin, FNP

## 2016-06-25 NOTE — Patient Instructions (Signed)
Cryotherapy Introduction WHAT IS CRYOTHERAPY? Cryotherapy, or cold therapy, is a treatment that uses cold temperatures to treat an injury or medical condition. It includes using cold packs or ice packs to reduce pain and swelling. Only use cryotherapy if your doctor says it is okay. HOW DO I USE CRYOTHERAPY?  Place a towel between the cold source and your skin.  Apply the cold source for no more than 20 minutes at a time.  Check your skin after 5 minutes to make sure there are no signs of a poor response to cold or skin damage. Check for:  White spots on your skin. Your skin may look blotchy or mottled.  Skin that looks blue or pale.  Skin that feels waxy or hard.  Repeat these steps as many times each day as told by your doctor. HOW CAN I MAKE A COLD PACK? When using a cold pack at home to reduce pain and swelling, you can use:  A silica gel cold pack that has been left in the freezer. You can buy this online or in stores.  A plastic bag of frozen vegetables.  A sealable plastic bag that has been filled with crushed ice. Always wrap the pack in a dry or damp towel to avoid direct contact with your skin. WHEN SHOULD I CALL MY DOCTOR? Call your doctor if:  You start to have white spots on your skin. This may give your skin a blotchy or mottled look.  Your skin turns blue or pale.  Your skin becomes waxy or hard.  Your swelling gets worse. This information is not intended to replace advice given to you by your health care provider. Make sure you discuss any questions you have with your health care provider. Document Released: 12/11/2007 Document Revised: 11/30/2015 Document Reviewed: 03/08/2015  2017 Elsevier

## 2016-07-02 ENCOUNTER — Other Ambulatory Visit: Payer: Self-pay | Admitting: Orthopedic Surgery

## 2016-12-20 ENCOUNTER — Ambulatory Visit: Payer: BLUE CROSS/BLUE SHIELD | Admitting: Pediatrics

## 2016-12-23 ENCOUNTER — Ambulatory Visit (INDEPENDENT_AMBULATORY_CARE_PROVIDER_SITE_OTHER): Payer: BLUE CROSS/BLUE SHIELD | Admitting: Pediatrics

## 2016-12-23 ENCOUNTER — Encounter: Payer: Self-pay | Admitting: Pediatrics

## 2016-12-23 VITALS — BP 145/100 | HR 106 | Temp 98.7°F | Ht 60.0 in | Wt 143.0 lb

## 2016-12-23 DIAGNOSIS — I10 Essential (primary) hypertension: Secondary | ICD-10-CM | POA: Diagnosis not present

## 2016-12-23 MED ORDER — HYDROCHLOROTHIAZIDE 12.5 MG PO TABS: 12.5000 mg | ORAL_TABLET | Freq: Every day | ORAL | 3 refills | Status: DC

## 2016-12-23 NOTE — Patient Instructions (Signed)
Come back in 2-3 days for BP recheck with nurse visit

## 2016-12-23 NOTE — Progress Notes (Signed)
  Subjective:   Patient ID: [name], female    DOB: February 08, 1961, 56 y.o.   MRN: [id number] CC: Hypertension  HPI: [name] is a 56 y.o. female presenting for Hypertension  Trying to get functional capacity test done for disability evaluation Was told her BP needed to be better controlled before can be done 150s-160s/100s when checked No HA, no CP, no SOB Has ongoing ankle pain from injury and surgery but otherwise is feeling well  Relevant past medical, surgical, family and social history reviewed. Allergies and medications reviewed and updated. History  Smoking Status  . Current Every Day Smoker  . Packs/day: 0.50  . Types: Cigarettes  . Start date: 10/02/1983  Smokeless Tobacco  . Never Used   ROS: Per HPI   Objective:    BP (!) 145/100   Pulse (!) 106   Temp 98.7 F (37.1 C) (Oral)   Ht 5' (1.524 m)   Wt 143 lb (64.9 kg)   LMP 07/17/2012   BMI 27.93 kg/m   Wt Readings from Last 3 Encounters:  12/23/16 143 lb (64.9 kg)  06/25/16 129 lb (58.5 kg)  02/09/16 129 lb (58.5 kg)    Gen: NAD, alert, cooperative with exam, NCAT EYES: EOMI, no conjunctival injection, or no icterus ENT:  OP without erythema LYMPH: no cervical LAD CV: NRRR, normal S1/S2, no murmur, distal pulses 2+ b/l Resp: CTABL, no wheezes, normal WOB Abd: +BS, soft, NTND Ext: No edema, warm Neuro: Alert and oriented  Assessment & Plan:  [name] was seen today for hypertension.  Diagnoses and all orders for this visit:  Essential hypertension Uncontrolled Start below rtc for BP recheck later this week for OK to do PT once BP better controlled -     hydrochlorothiazide (HYDRODIURIL) 12.5 MG tablet; Take 1 tablet (12.5 mg total) by mouth daily. -     BMP8+EGFR   Follow up plan: Return in about 4 weeks (around 01/20/2017). Assunta Found, MD [place]

## 2016-12-24 LAB — BMP8+EGFR
BUN / CREAT RATIO: 12 (ref 9–23)
BUN: 9 mg/dL (ref 6–24)
CALCIUM: 9.8 mg/dL (ref 8.7–10.2)
CO2: 23 mmol/L (ref 20–29)
CREATININE: 0.77 mg/dL (ref 0.57–1.00)
Chloride: 105 mmol/L (ref 96–106)
GFR, EST AFRICAN AMERICAN: 101 mL/min/{1.73_m2} (ref >59)
GFR, EST NON AFRICAN AMERICAN: 87 mL/min/{1.73_m2} (ref >59)
Glucose: 84 mg/dL (ref 65–99)
Potassium: 4 mmol/L (ref 3.5–5.2)
Sodium: 146 mmol/L — ABNORMAL HIGH (ref 134–144)

## 2016-12-26 ENCOUNTER — Other Ambulatory Visit: Payer: Self-pay

## 2016-12-26 ENCOUNTER — Ambulatory Visit (INDEPENDENT_AMBULATORY_CARE_PROVIDER_SITE_OTHER): Payer: BLUE CROSS/BLUE SHIELD

## 2016-12-26 VITALS — BP 134/99 | HR 102

## 2016-12-26 DIAGNOSIS — I10 Essential (primary) hypertension: Secondary | ICD-10-CM

## 2016-12-26 MED ORDER — HYDROCHLOROTHIAZIDE 12.5 MG PO TABS: 12.5000 mg | ORAL_TABLET | Freq: Every day | ORAL | 2 refills | Status: DC

## 2016-12-26 NOTE — Progress Notes (Signed)
Patient presents to office today for a BP recheck per Dr Oswaldo DoneVincent. She brings in a log of readings from home with her from this week. Monday at 9:37 pm per BP was 158/90. Tuesday 143/98. Wednesday 148/94. Thursday am 138/104. Here at the office it reads 134/99. Discussed with Dr Oswaldo DoneVincent and she advised to take two hydrochlorothiazides daily and return to the office in 1 week for a BP recheck again. Patient verbalized understanding. [facility]

## 2017-01-02 ENCOUNTER — Ambulatory Visit: Payer: BLUE CROSS/BLUE SHIELD

## 2017-01-07 ENCOUNTER — Encounter: Payer: Self-pay | Admitting: Nurse Practitioner

## 2017-01-07 ENCOUNTER — Ambulatory Visit (INDEPENDENT_AMBULATORY_CARE_PROVIDER_SITE_OTHER): Payer: BLUE CROSS/BLUE SHIELD | Admitting: Nurse Practitioner

## 2017-01-07 VITALS — BP 124/87 | HR 105 | Temp 97.2°F | Ht 60.0 in | Wt 139.0 lb

## 2017-01-07 DIAGNOSIS — I10 Essential (primary) hypertension: Secondary | ICD-10-CM | POA: Diagnosis not present

## 2017-01-07 MED ORDER — HYDROCHLOROTHIAZIDE 12.5 MG PO TABS: 12.5000 mg | ORAL_TABLET | Freq: Every day | ORAL | 5 refills | Status: DC

## 2017-01-07 NOTE — Progress Notes (Signed)
   Subjective:    Patient ID: [name], female    DOB: 21-Sep-1960, 56 y.o.   MRN: [id number]  HPI Patient in the office for a recheck of her blood pressure.  HCTZ 12.5 mg by mouth daily started on 12/23/16.  Patient states she has been taking 25 mg (2 tablets) of HCTZ daily as instructed by the office.  She is checking her blood pressure daily and states it is usually around 140s/90s.     Review of Systems  Respiratory: Negative for shortness of breath and wheezing.   Cardiovascular: Negative for chest pain and palpitations.  Genitourinary: Negative for flank pain.  All other systems reviewed and are negative.      Objective:   Physical Exam  Constitutional: She is oriented to person, place, and time. She appears well-developed and well-nourished. No distress.  HENT:  Head: Normocephalic.  Eyes: Pupils are equal, round, and reactive to light.  Neck: Normal range of motion. No JVD present. No thyromegaly present.  Cardiovascular: Normal rate, regular rhythm, normal heart sounds and intact distal pulses.   No murmur heard. Pulmonary/Chest: Effort normal and breath sounds normal. No respiratory distress. She has no wheezes.  Abdominal: Soft. Bowel sounds are normal.  Musculoskeletal: Normal range of motion.  Neurological: She is alert and oriented to person, place, and time.  Skin: Skin is warm and dry.  Psychiatric: She has a normal mood and affect. Her behavior is normal. Judgment and thought content normal.   BP 124/87   Pulse (!) 105   Temp (!) 97.2 F (36.2 C) (Oral)   Ht 5' (1.524 m)   Wt 139 lb (63 kg)   LMP 07/17/2012   BMI 27.15 kg/m      Assessment & Plan:  1. Essential hypertension, benign Continue all meds Low sodium diet Follow up in 3 months  Mary-Margaret Daphine DeutscherMartin, FNP

## 2017-01-07 NOTE — Addendum Note (Signed)
Addended by: Bennie PieriniMARTIN, MARY-MARGARET on: 01/07/2017 03:52 PM   Modules accepted: Orders

## 2017-01-07 NOTE — Patient Instructions (Signed)
DASH Eating Plan DASH stands for "Dietary Approaches to Stop Hypertension." The DASH eating plan is a healthy eating plan that has been shown to reduce high blood pressure (hypertension). It may also reduce your risk for type 2 diabetes, heart disease, and stroke. The DASH eating plan may also help with weight loss. What are tips for following this plan? General guidelines  Avoid eating more than 2,300 mg (milligrams) of salt (sodium) a day. If you have hypertension, you may need to reduce your sodium intake to 1,500 mg a day.  Limit alcohol intake to no more than 1 drink a day for nonpregnant women and 2 drinks a day for men. One drink equals 12 oz of beer, 5 oz of wine, or 1 oz of hard liquor.  Work with your health care provider to maintain a healthy body weight or to lose weight. Ask what an ideal weight is for you.  Get at least 30 minutes of exercise that causes your heart to beat faster (aerobic exercise) most days of the week. Activities may include walking, swimming, or biking.  Work with your health care provider or diet and nutrition specialist (dietitian) to adjust your eating plan to your individual calorie needs. Reading food labels  Check food labels for the amount of sodium per serving. Choose foods with less than 5 percent of the Daily Value of sodium. Generally, foods with less than 300 mg of sodium per serving fit into this eating plan.  To find whole grains, look for the word "whole" as the first word in the ingredient list. Shopping  Buy products labeled as "low-sodium" or "no salt added."  Buy fresh foods. Avoid canned foods and premade or frozen meals. Cooking  Avoid adding salt when cooking. Use salt-free seasonings or herbs instead of table salt or sea salt. Check with your health care provider or pharmacist before using salt substitutes.  Do not fry foods. Cook foods using healthy methods such as baking, boiling, grilling, and broiling instead.  Cook with  heart-healthy oils, such as olive, canola, soybean, or sunflower oil. Meal planning   Eat a balanced diet that includes: ? 5 or more servings of fruits and vegetables each day. At each meal, try to fill half of your plate with fruits and vegetables. ? Up to 6-8 servings of whole grains each day. ? Less than 6 oz of lean meat, poultry, or fish each day. A 3-oz serving of meat is about the same size as a deck of cards. One egg equals 1 oz. ? 2 servings of low-fat dairy each day. ? A serving of nuts, seeds, or beans 5 times each week. ? Heart-healthy fats. Healthy fats called Omega-3 fatty acids are found in foods such as flaxseeds and coldwater fish, like sardines, salmon, and mackerel.  Limit how much you eat of the following: ? Canned or prepackaged foods. ? Food that is high in trans fat, such as fried foods. ? Food that is high in saturated fat, such as fatty meat. ? Sweets, desserts, sugary drinks, and other foods with added sugar. ? Full-fat dairy products.  Do not salt foods before eating.  Try to eat at least 2 vegetarian meals each week.  Eat more home-cooked food and less restaurant, buffet, and fast food.  When eating at a restaurant, ask that your food be prepared with less salt or no salt, if possible. What foods are recommended? The items listed may not be a complete list. Talk with your dietitian about what   dietary choices are best for you. Grains Whole-grain or whole-wheat bread. Whole-grain or whole-wheat pasta. Brown rice. Oatmeal. Quinoa. Bulgur. Whole-grain and low-sodium cereals. Pita bread. Low-fat, low-sodium crackers. Whole-wheat flour tortillas. Vegetables Fresh or frozen vegetables (raw, steamed, roasted, or grilled). Low-sodium or reduced-sodium tomato and vegetable juice. Low-sodium or reduced-sodium tomato sauce and tomato paste. Low-sodium or reduced-sodium canned vegetables. Fruits All fresh, dried, or frozen fruit. Canned fruit in natural juice (without  added sugar). Meat and other protein foods Skinless chicken or turkey. Ground chicken or turkey. Pork with fat trimmed off. Fish and seafood. Egg whites. Dried beans, peas, or lentils. Unsalted nuts, nut butters, and seeds. Unsalted canned beans. Lean cuts of beef with fat trimmed off. Low-sodium, lean deli meat. Dairy Low-fat (1%) or fat-free (skim) milk. Fat-free, low-fat, or reduced-fat cheeses. Nonfat, low-sodium ricotta or cottage cheese. Low-fat or nonfat yogurt. Low-fat, low-sodium cheese. Fats and oils Soft margarine without trans fats. Vegetable oil. Low-fat, reduced-fat, or light mayonnaise and salad dressings (reduced-sodium). Canola, safflower, olive, soybean, and sunflower oils. Avocado. Seasoning and other foods Herbs. Spices. Seasoning mixes without salt. Unsalted popcorn and pretzels. Fat-free sweets. What foods are not recommended? The items listed may not be a complete list. Talk with your dietitian about what dietary choices are best for you. Grains Baked goods made with fat, such as croissants, muffins, or some breads. Dry pasta or rice meal packs. Vegetables Creamed or fried vegetables. Vegetables in a cheese sauce. Regular canned vegetables (not low-sodium or reduced-sodium). Regular canned tomato sauce and paste (not low-sodium or reduced-sodium). Regular tomato and vegetable juice (not low-sodium or reduced-sodium). Pickles. Olives. Fruits Canned fruit in a light or heavy syrup. Fried fruit. Fruit in cream or butter sauce. Meat and other protein foods Fatty cuts of meat. Ribs. Fried meat. Bacon. Sausage. Bologna and other processed lunch meats. Salami. Fatback. Hotdogs. Bratwurst. Salted nuts and seeds. Canned beans with added salt. Canned or smoked fish. Whole eggs or egg yolks. Chicken or turkey with skin. Dairy Whole or 2% milk, cream, and half-and-half. Whole or full-fat cream cheese. Whole-fat or sweetened yogurt. Full-fat cheese. Nondairy creamers. Whipped toppings.  Processed cheese and cheese spreads. Fats and oils Butter. Stick margarine. Lard. Shortening. Ghee. Bacon fat. Tropical oils, such as coconut, palm kernel, or palm oil. Seasoning and other foods Salted popcorn and pretzels. Onion salt, garlic salt, seasoned salt, table salt, and sea salt. Worcestershire sauce. Tartar sauce. Barbecue sauce. Teriyaki sauce. Soy sauce, including reduced-sodium. Steak sauce. Canned and packaged gravies. Fish sauce. Oyster sauce. Cocktail sauce. Horseradish that you find on the shelf. Ketchup. Mustard. Meat flavorings and tenderizers. Bouillon cubes. Hot sauce and Tabasco sauce. Premade or packaged marinades. Premade or packaged taco seasonings. Relishes. Regular salad dressings. Where to find more information:  National Heart, Lung, and Blood Institute: www.nhlbi.nih.gov  American Heart Association: www.heart.org Summary  The DASH eating plan is a healthy eating plan that has been shown to reduce high blood pressure (hypertension). It may also reduce your risk for type 2 diabetes, heart disease, and stroke.  With the DASH eating plan, you should limit salt (sodium) intake to 2,300 mg a day. If you have hypertension, you may need to reduce your sodium intake to 1,500 mg a day.  When on the DASH eating plan, aim to eat more fresh fruits and vegetables, whole grains, lean proteins, low-fat dairy, and heart-healthy fats.  Work with your health care provider or diet and nutrition specialist (dietitian) to adjust your eating plan to your individual   calorie needs. This information is not intended to replace advice given to you by your health care provider. Make sure you discuss any questions you have with your health care provider. Document Released: 06/13/2011 Document Revised: 06/17/2016 Document Reviewed: 06/17/2016 Elsevier Interactive Patient Education  2017 Elsevier Inc.  

## 2017-02-25 ENCOUNTER — Other Ambulatory Visit: Payer: Self-pay | Admitting: Nurse Practitioner

## 2017-02-25 MED ORDER — HYDROCHLOROTHIAZIDE 25 MG PO TABS
25.0000 mg | ORAL_TABLET | Freq: Every day | ORAL | 1 refills | Status: DC
Start: 2017-02-25 — End: 2017-09-08

## 2017-03-07 ENCOUNTER — Encounter: Payer: Self-pay | Admitting: Family

## 2017-03-07 ENCOUNTER — Ambulatory Visit (INDEPENDENT_AMBULATORY_CARE_PROVIDER_SITE_OTHER): Payer: BLUE CROSS/BLUE SHIELD | Admitting: Family

## 2017-03-07 VITALS — BP 135/83 | HR 86 | Temp 98.0°F | Ht 60.0 in | Wt 133.4 lb

## 2017-03-07 DIAGNOSIS — M546 Pain in thoracic spine: Secondary | ICD-10-CM

## 2017-03-07 MED ORDER — PREDNISONE 10 MG (21) PO TBPK: ORAL_TABLET | ORAL | 0 refills | Status: DC

## 2017-03-07 MED ORDER — NAPROXEN 500 MG PO TABS: 500.0000 mg | ORAL_TABLET | Freq: Two times a day (BID) | ORAL | 1 refills | Status: DC

## 2017-03-07 MED ORDER — BACLOFEN 10 MG PO TABS: 10.0000 mg | ORAL_TABLET | Freq: Three times a day (TID) | ORAL | 0 refills | Status: DC

## 2017-03-07 NOTE — Progress Notes (Signed)
   Subjective:    Patient ID: Linda ReiningMichelle A Habenicht, female    DOB: 09-30-1960, 56 y.o.   MRN: [id number]  HPI    Review of Systems     Objective:   Physical Exam        Assessment & Plan:

## 2017-03-07 NOTE — Progress Notes (Signed)
   Subjective:    Patient ID: [name], female    DOB: 02/01/1961, 56 y.o.   MRN: [id number]  Back Pain  This is a new problem. The current episode started yesterday. The problem occurs constantly. The problem is unchanged. The pain is present in the thoracic spine. The quality of the pain is described as aching. The pain does not radiate. The pain is at a severity of 6/10. The pain is moderate. The symptoms are aggravated by bending and standing. Pertinent negatives include no bladder incontinence, bowel incontinence, leg pain or weakness. She has tried bed rest, ice and NSAIDs for the symptoms. The treatment provided mild relief.      Review of Systems  Gastrointestinal: Negative for bowel incontinence.  Genitourinary: Negative for bladder incontinence.  Musculoskeletal: Positive for back pain.  Neurological: Negative for weakness.  All other systems reviewed and are negative.      Objective:   Physical Exam  Constitutional: She is oriented to person, place, and time. She appears well-developed and well-nourished. No distress.  HENT:  Head: Normocephalic.  Cardiovascular: Normal rate, regular rhythm, normal heart sounds and intact distal pulses.   No murmur heard. Pulmonary/Chest: Effort normal and breath sounds normal. No respiratory distress. She has no wheezes.  Abdominal: Soft. Bowel sounds are normal. She exhibits no distension. There is no tenderness.  Musculoskeletal: Normal range of motion. She exhibits no edema or tenderness.  Thoracic tenderness with flexion and extension  Neurological: She is alert and oriented to person, place, and time.  Skin: Skin is warm and dry.  Psychiatric: She has a normal mood and affect. Her behavior is normal. Judgment and thought content normal.  Vitals reviewed.    BP 135/83   Pulse 86   Temp 98 F (36.7 C) (Oral)   Ht 5' (1.524 m)   Wt 133 lb 6.4 oz (60.5 kg)   LMP 07/17/2012   BMI 26.05 kg/m       Assessment & Plan:   1. Acute bilateral thoracic back pain Rest Ice and heat  ROM exercises discussed No other NSAIDs with naproxen  RTO prn  - naproxen (NAPROSYN) 500 MG tablet; Take 1 tablet (500 mg total) by mouth 2 (two) times daily with a meal.  Dispense: 60 tablet; Refill: 1 - predniSONE (STERAPRED UNI-PAK 21 TAB) 10 MG (21) TBPK tablet; Use as directed  Dispense: 21 tablet; Refill: 0 - baclofen (LIORESAL) 10 MG tablet; Take 1 tablet (10 mg total) by mouth 3 (three) times daily.  Dispense: 30 each; Refill: 0    Jannifer Rodneyhristy Kamaiya Antilla, FNP

## 2017-03-07 NOTE — Patient Instructions (Signed)
Thoracic Strain A thoracic strain, which is sometimes called a mid-back strain, is an injury to the muscles or tendons that attach to the upper part of your back behind your chest. This type of injury occurs when a muscle is overstretched or overloaded. Thoracic strains can range from mild to severe. Mild strains may involve stretching a muscle or tendon without tearing it. These injuries may heal in 1-2 weeks. More severe strains involve tearing of muscle fibers or tendons. These will cause more pain and may take 6-8 weeks to heal. What are the causes? This condition may be caused by:  An injury in which a sudden force is placed on the muscle.  Exercising without properly warming up.  Overuse of the muscle.  Improper form during certain movements.  Other injuries that surround or cause stress on the mid-back, causing a strain on the muscles.  In some cases, the cause may not be known. What increases the risk? This injury is more common in:  Athletes.  People with obesity.  What are the signs or symptoms? The main symptom of this condition is pain, especially with movement. Other symptoms include:  Bruising.  Swelling.  Spasm.  How is this diagnosed? This condition may be diagnosed with a physical exam. X-rays may be taken to check for a fracture. How is this treated? This condition may be treated with:  Resting and icing the injured area.  Physical therapy. This will involve doing stretching and strengthening exercises.  Medicines for pain and inflammation.  Follow these instructions at home:  Rest as needed. Follow instructions from your health care provider about any restrictions on activity.  If directed, apply ice to the injured area: ? Put ice in a plastic bag. ? Place a towel between your skin and the bag. ? Leave the ice on for 20 minutes, 2-3 times per day.  Take over-the-counter and prescription medicines only as told by your health care  provider.  Begin doing exercises as told by your health care provider or physical therapist.  Always warm up properly before physical activity or sports.  Bend your knees before you lift heavy objects.  Keep all follow-up visits as told by your health care provider. This is important. Contact a health care provider if:  Your pain is not helped by medicine.  Your pain, bruising, or swelling is getting worse.  You have a fever. Get help right away if:  You have shortness of breath.  You have chest pain.  You develop numbness or weakness in your legs.  You have involuntary loss of urine (urinary incontinence). This information is not intended to replace advice given to you by your health care provider. Make sure you discuss any questions you have with your health care provider. Document Released: 09/14/2003 Document Revised: 02/24/2016 Document Reviewed: 08/18/2014 Elsevier Interactive Patient Education  2018 Elsevier Inc.  

## 2017-03-11 ENCOUNTER — Telehealth: Payer: Self-pay | Admitting: Nurse Practitioner

## 2017-03-11 MED ORDER — ZOLPIDEM TARTRATE 5 MG PO TABS: 5.0000 mg | ORAL_TABLET | Freq: Every evening | ORAL | 1 refills | Status: DC | PRN

## 2017-03-11 NOTE — Addendum Note (Signed)
Addended by: Bennie PieriniMARTIN, MARY-MARGARET on: 03/11/2017 05:13 PM   Modules accepted: Orders

## 2017-03-11 NOTE — Telephone Encounter (Signed)
Pt will have to get Ambien from PCP

## 2017-03-11 NOTE — Telephone Encounter (Signed)
MMM, Please advise if you wil give patient rx of Ambien.

## 2017-03-11 NOTE — Telephone Encounter (Signed)
Please review and advise.

## 2017-03-11 NOTE — Telephone Encounter (Signed)
Patient is c/o of N&V after taking her new meds baclofen and naproxen Friday. She states she has not taking the prednisone yet. She is asking for Ambein to help her sleep. She states you told her you would call some in. Patient is eating and drinking fine now. [facility] [place] Please advise

## 2017-03-11 NOTE — Telephone Encounter (Signed)
Please call in ambien 5 mg 1 po qhs #30 with 1 refills 

## 2017-03-12 NOTE — Telephone Encounter (Signed)
Phoned in med

## 2017-04-18 ENCOUNTER — Ambulatory Visit (INDEPENDENT_AMBULATORY_CARE_PROVIDER_SITE_OTHER): Payer: BLUE CROSS/BLUE SHIELD | Admitting: Nurse Practitioner

## 2017-04-18 ENCOUNTER — Encounter: Payer: Self-pay | Admitting: Nurse Practitioner

## 2017-04-18 VITALS — BP 131/85 | HR 86 | Temp 97.4°F | Ht 60.0 in | Wt 135.0 lb

## 2017-04-18 DIAGNOSIS — F321 Major depressive disorder, single episode, moderate: Secondary | ICD-10-CM

## 2017-04-18 DIAGNOSIS — F5101 Primary insomnia: Secondary | ICD-10-CM

## 2017-04-18 MED ORDER — ZOLPIDEM TARTRATE 5 MG PO TABS: 5.0000 mg | ORAL_TABLET | Freq: Every evening | ORAL | 1 refills | Status: DC | PRN

## 2017-04-18 MED ORDER — ESCITALOPRAM OXALATE 20 MG PO TABS: 20.0000 mg | ORAL_TABLET | Freq: Every day | ORAL | 5 refills | Status: DC

## 2017-04-18 NOTE — Patient Instructions (Signed)
Stress and Stress Management Stress is a normal reaction to life events. It is what you feel when life demands more than you are used to or more than you can handle. Some stress can be useful. For example, the stress reaction can help you catch the last bus of the day, study for a test, or meet a deadline at work. But stress that occurs too often or for too long can cause problems. It can affect your emotional health and interfere with relationships and normal daily activities. Too much stress can weaken your immune system and increase your risk for physical illness. If you already have a medical problem, stress can make it worse. What are the causes? All sorts of life events may cause stress. An event that causes stress for one person may not be stressful for another person. Major life events commonly cause stress. These may be positive or negative. Examples include losing your job, moving into a new home, getting married, having a baby, or losing a loved one. Less obvious life events may also cause stress, especially if they occur day after day or in combination. Examples include working long hours, driving in traffic, caring for children, being in debt, or being in a difficult relationship. What are the signs or symptoms? Stress may cause emotional symptoms including, the following:  Anxiety. This is feeling worried, afraid, on edge, overwhelmed, or out of control.  Anger. This is feeling irritated or impatient.  Depression. This is feeling sad, down, helpless, or guilty.  Difficulty focusing, remembering, or making decisions.  Stress may cause physical symptoms, including the following:  Aches and pains. These may affect your head, neck, back, stomach, or other areas of your body.  Tight muscles or clenched jaw.  Low energy or trouble sleeping.  Stress may cause unhealthy behaviors, including the following:  Eating to feel better (overeating) or skipping meals.  Sleeping too little,  too much, or both.  Working too much or putting off tasks (procrastination).  Smoking, drinking alcohol, or using drugs to feel better.  How is this diagnosed? Stress is diagnosed through an assessment by your health care provider. Your health care provider will ask questions about your symptoms and any stressful life events.Your health care provider will also ask about your medical history and may order blood tests or other tests. Certain medical conditions and medicine can cause physical symptoms similar to stress. Mental illness can cause emotional symptoms and unhealthy behaviors similar to stress. Your health care provider may refer you to a mental health professional for further evaluation. How is this treated? Stress management is the recommended treatment for stress.The goals of stress management are reducing stressful life events and coping with stress in healthy ways. Techniques for reducing stressful life events include the following:  Stress identification. Self-monitor for stress and identify what causes stress for you. These skills may help you to avoid some stressful events.  Time management. Set your priorities, keep a calendar of events, and learn to say "no." These tools can help you avoid making too many commitments.  Techniques for coping with stress include the following:  Rethinking the problem. Try to think realistically about stressful events rather than ignoring them or overreacting. Try to find the positives in a stressful situation rather than focusing on the negatives.  Exercise. Physical exercise can release both physical and emotional tension. The key is to find a form of exercise you enjoy and do it regularly.  Relaxation techniques. These relax the body and  mind. Examples include yoga, meditation, tai chi, biofeedback, deep breathing, progressive muscle relaxation, listening to music, being out in nature, journaling, and other hobbies. Again, the key is to find  one or more that you enjoy and can do regularly.  Healthy lifestyle. Eat a balanced diet, get plenty of sleep, and do not smoke. Avoid using alcohol or drugs to relax.  Strong support network. Spend time with family, friends, or other people you enjoy being around.Express your feelings and talk things over with someone you trust.  Counseling or talktherapy with a mental health professional may be helpful if you are having difficulty managing stress on your own. Medicine is typically not recommended for the treatment of stress.Talk to your health care provider if you think you need medicine for symptoms of stress. Follow these instructions at home:  Keep all follow-up visits as directed by your health care provider.  Take all medicines as directed by your health care provider. Contact a health care provider if:  Your symptoms get worse or you start having new symptoms.  You feel overwhelmed by your problems and can no longer manage them on your own. Get help right away if:  You feel like hurting yourself or someone else. This information is not intended to replace advice given to you by your health care provider. Make sure you discuss any questions you have with your health care provider. Document Released: 12/18/2000 Document Revised: 11/30/2015 Document Reviewed: 02/16/2013 Elsevier Interactive Patient Education  2017 Elsevier Inc.  

## 2017-04-18 NOTE — Addendum Note (Signed)
Addended by: Bennie Pierini on: 04/18/2017 02:40 PM   Modules accepted: Orders

## 2017-04-18 NOTE — Progress Notes (Signed)
   Subjective:    Patient ID: [name], female    DOB: 1960-11-18, 56 y.o.   MRN: [id number]  HPI  [name] come sin to discuss depression. 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 Patient says that she sits around and cries all the time. Feels helpless. deneis any suicidal thoughts. She had foot surgery in December and has not been back to work yet. She has to have a sit down job and is not to lift over 15kbs so she cannot go back to her old job. She has had trouble finding anything. ALso, her grandkids that lived with her are in [place] with their dad and she is constantly worried about them.   Review of Systems  Constitutional: Negative for activity change and appetite change.  HENT: Negative.   Eyes: Negative for pain.  Respiratory: Negative for shortness of breath.   Cardiovascular: Negative for chest pain, palpitations and leg swelling.  Gastrointestinal: Negative for abdominal pain.  Endocrine: Negative for polydipsia.  Genitourinary: Negative.   Skin: Negative for rash.  Neurological: Negative for dizziness, weakness and headaches.  Hematological: Does not bruise/bleed easily.  Psychiatric/Behavioral: Negative.   All other systems reviewed and are negative.      Objective:   Physical Exam  Constitutional: She is oriented to person, place, and time. She appears well-developed and well-nourished. No distress.  Cardiovascular: Normal rate and regular rhythm.   Pulmonary/Chest: Effort normal and breath sounds normal.  Neurological: She is alert and oriented to person, place, and time.  Skin: Skin is warm.  Psychiatric: She has a normal mood and affect. Her behavior is normal. Judgment and thought content normal.  Tearful when talking about grand kids Good eye contact    BP 131/85   Pulse 86   Temp (!) 97.4 F (36.3 C) (Oral)   Ht 5' (1.524 m)   Wt 135 lb (61.2 kg)   LMP 07/17/2012   BMI 26.37 kg/m        Assessment & Plan:  1. Current  moderate episode of major depressive disorder without prior episode (HCC) Stress management - escitalopram (LEXAPRO) 20 MG tablet; Take 1 tablet (20 mg total) by mouth daily.  Dispense: 30 tablet; Refill: 5  2. Primary insomnia Bedtime routine - zolpidem (AMBIEN) 5 MG tablet; Take 1 tablet (5 mg total) by mouth at bedtime as needed for sleep.  Dispense: 30 tablet; Refill: 1   Mary-Margaret Daphine Deutscher, FNP

## 2017-09-08 ENCOUNTER — Other Ambulatory Visit: Payer: Self-pay | Admitting: Nurse Practitioner

## 2017-09-08 DIAGNOSIS — F5101 Primary insomnia: Secondary | ICD-10-CM

## 2017-09-08 NOTE — Telephone Encounter (Signed)
What is the name of the medication? hydrochlorothiazide (HYDRODIURIL) 25 MG tablet  Have you contacted your pharmacy to request a refill? yes  Which pharmacy would you like this sent to? [facility]   Patient notified that their request is being sent to the clinical staff for review and that they should receive a call once it is complete. If they do not receive a call within 24 hours they can check with their pharmacy or our office.

## 2017-09-09 NOTE — Telephone Encounter (Signed)
Patient aware that HCTZ was sent to pharmacy on 09/08/17.  Appt made for follow up

## 2017-09-11 ENCOUNTER — Encounter: Payer: Self-pay | Admitting: Nurse Practitioner

## 2017-09-11 ENCOUNTER — Ambulatory Visit (INDEPENDENT_AMBULATORY_CARE_PROVIDER_SITE_OTHER): Payer: PRIVATE HEALTH INSURANCE | Admitting: Nurse Practitioner

## 2017-09-11 VITALS — BP 122/85 | HR 93 | Temp 98.4°F | Ht 60.0 in | Wt 138.0 lb

## 2017-09-11 DIAGNOSIS — F5101 Primary insomnia: Secondary | ICD-10-CM

## 2017-09-11 DIAGNOSIS — E785 Hyperlipidemia, unspecified: Secondary | ICD-10-CM

## 2017-09-11 DIAGNOSIS — I10 Essential (primary) hypertension: Secondary | ICD-10-CM | POA: Diagnosis not present

## 2017-09-11 DIAGNOSIS — F321 Major depressive disorder, single episode, moderate: Secondary | ICD-10-CM

## 2017-09-11 DIAGNOSIS — G43109 Migraine with aura, not intractable, without status migrainosus: Secondary | ICD-10-CM

## 2017-09-11 DIAGNOSIS — E559 Vitamin D deficiency, unspecified: Secondary | ICD-10-CM

## 2017-09-11 LAB — CMP14+EGFR
A/G RATIO: 1.8 (ref 1.2–2.2)
ALK PHOS: 79 IU/L (ref 39–117)
ALT: 43 IU/L — AB (ref 0–32)
AST: 34 IU/L (ref 0–40)
Albumin: 4.8 g/dL (ref 3.5–5.5)
BILIRUBIN TOTAL: 0.3 mg/dL (ref 0.0–1.2)
BUN/Creatinine Ratio: 11 (ref 9–23)
BUN: 8 mg/dL (ref 6–24)
CO2: 23 mmol/L (ref 20–29)
Calcium: 9.9 mg/dL (ref 8.7–10.2)
Chloride: 102 mmol/L (ref 96–106)
Creatinine, Ser: 0.76 mg/dL (ref 0.57–1.00)
GFR calc Af Amer: 102 mL/min/{1.73_m2} (ref >59)
GFR calc non Af Amer: 89 mL/min/{1.73_m2} (ref >59)
GLUCOSE: 98 mg/dL (ref 65–99)
Globulin, Total: 2.6 g/dL (ref 1.5–4.5)
POTASSIUM: 3.7 mmol/L (ref 3.5–5.2)
Sodium: 142 mmol/L (ref 134–144)
Total Protein: 7.4 g/dL (ref 6.0–8.5)

## 2017-09-11 LAB — LIPID PANEL
CHOLESTEROL TOTAL: 227 mg/dL — AB (ref 100–199)
Chol/HDL Ratio: 8.1 ratio — ABNORMAL HIGH (ref 0.0–4.4)
HDL: 28 mg/dL — AB (ref >39)
LDL Calculated: 133 mg/dL — ABNORMAL HIGH (ref 0–99)
TRIGLYCERIDES: 328 mg/dL — AB (ref 0–149)
VLDL Cholesterol Cal: 66 mg/dL — ABNORMAL HIGH (ref 5–40)

## 2017-09-11 MED ORDER — ZOLPIDEM TARTRATE 5 MG PO TABS: 5.0000 mg | ORAL_TABLET | Freq: Every evening | ORAL | 2 refills | Status: DC | PRN

## 2017-09-11 MED ORDER — ESCITALOPRAM OXALATE 20 MG PO TABS: 20.0000 mg | ORAL_TABLET | Freq: Every day | ORAL | 5 refills | Status: DC

## 2017-09-11 MED ORDER — HYDROCHLOROTHIAZIDE 25 MG PO TABS: 25.0000 mg | ORAL_TABLET | Freq: Every day | ORAL | 1 refills | Status: DC

## 2017-09-11 NOTE — Progress Notes (Signed)
Subjective:    Patient ID: [name], female    DOB: June 01, 1961, 57 y.o.   MRN: [id number]  HPI  CAYTLIN BETTER is here today for follow up of chronic medical problem.  Outpatient Encounter Medications as of 09/11/2017  Medication Sig  . escitalopram (LEXAPRO) 20 MG tablet Take 1 tablet (20 mg total) by mouth daily.  . hydrochlorothiazide (HYDRODIURIL) 25 MG tablet TAKE 1 TABLET BY MOUTH ONCE DAILY  . zolpidem (AMBIEN) 5 MG tablet TAKE 1 TABLET BY MOUTH AT BEDTIME AS NEEDED FOR SLEEP     1. Essential hypertension, benign  No c/o chest pain, SOB or headache. Does not check blood pressure at home. BP Readings from Last 3 Encounters:  09/11/17 122/85  04/18/17 131/85  03/07/17 135/83     2. Migraine with aura and without status migrainosus, not intractable  has had one for the last 3 days  3. Hyperlipidemia with target LDL less than 100  Watches fat in diet  4. Vitamin D deficiency  Takes supplements daily.    New complaints: None today  Social history: grandkids have come back from [place] and she is keeping her granddaughter during the day    Review of Systems  Constitutional: Negative for activity change and appetite change.  HENT: Negative.   Eyes: Negative for pain.  Respiratory: Negative for shortness of breath.   Cardiovascular: Negative for chest pain, palpitations and leg swelling.  Gastrointestinal: Negative for abdominal pain.  Endocrine: Negative for polydipsia.  Genitourinary: Negative.   Skin: Negative for rash.  Neurological: Negative for dizziness, weakness and headaches.  Hematological: Does not bruise/bleed easily.  Psychiatric/Behavioral: Negative.   All other systems reviewed and are negative.      Objective:   Physical Exam  Constitutional: She is oriented to person, place, and time. She appears well-developed and well-nourished.  HENT:  Nose: Nose normal.  Mouth/Throat: Oropharynx is clear and moist.  Eyes: EOM are normal.   Neck: Trachea normal, normal range of motion and full passive range of motion without pain. Neck supple. No JVD present. Carotid bruit is not present. No thyromegaly present.  Cardiovascular: Normal rate, regular rhythm, normal heart sounds and intact distal pulses. Exam reveals no gallop and no friction rub.  No murmur heard. Pulmonary/Chest: Effort normal and breath sounds normal.  Abdominal: Soft. Bowel sounds are normal. She exhibits no distension and no mass. There is no tenderness.  Musculoskeletal: Normal range of motion.  Lymphadenopathy:    She has no cervical adenopathy.  Neurological: She is alert and oriented to person, place, and time. She has normal reflexes.  Skin: Skin is warm and dry.  Psychiatric: She has a normal mood and affect. Her behavior is normal. Judgment and thought content normal.   BP 122/85   Pulse 93   Temp 98.4 F (36.9 C) (Oral)   Ht 5' (1.524 m)   Wt 138 lb (62.6 kg)   LMP 07/17/2012   BMI 26.95 kg/m      Assessment & Plan:  1. Essential hypertension, benign Low sodium diet - hydrochlorothiazide (HYDRODIURIL) 25 MG tablet; Take 1 tablet (25 mg total) by mouth daily.  Dispense: 90 tablet; Refill: 1 - CMP14+EGFR  2. Migraine with aura and without status migrainosus, not intractable Avoid caffeine  3. Hyperlipidemia with target LDL less than 100 Low fat diet - Lipid panel  4. Vitamin D deficiency  5. Current moderate episode of major depressive disorder without prior episode [facility]) Stress management - escitalopram (  LEXAPRO) 20 MG tablet; Take 1 tablet (20 mg total) by mouth daily.  Dispense: 30 tablet; Refill: 5  6. Primary insomnia Bedtime routine - zolpidem (AMBIEN) 5 MG tablet; Take 1 tablet (5 mg total) by mouth at bedtime as needed. for sleep  Dispense: 30 tablet; Refill: 2    Labs pending Health maintenance reviewed Diet and exercise encouraged Continue all meds Follow up  In 6 months   [place], FNP

## 2017-09-11 NOTE — Patient Instructions (Signed)
Stress and Stress Management Stress is a normal reaction to life events. It is what you feel when life demands more than you are used to or more than you can handle. Some stress can be useful. For example, the stress reaction can help you catch the last bus of the day, study for a test, or meet a deadline at work. But stress that occurs too often or for too long can cause problems. It can affect your emotional health and interfere with relationships and normal daily activities. Too much stress can weaken your immune system and increase your risk for physical illness. If you already have a medical problem, stress can make it worse. What are the causes? All sorts of life events may cause stress. An event that causes stress for one person may not be stressful for another person. Major life events commonly cause stress. These may be positive or negative. Examples include losing your job, moving into a new home, getting married, having a baby, or losing a loved one. Less obvious life events may also cause stress, especially if they occur day after day or in combination. Examples include working long hours, driving in traffic, caring for children, being in debt, or being in a difficult relationship. What are the signs or symptoms? Stress may cause emotional symptoms including, the following:  Anxiety. This is feeling worried, afraid, on edge, overwhelmed, or out of control.  Anger. This is feeling irritated or impatient.  Depression. This is feeling sad, down, helpless, or guilty.  Difficulty focusing, remembering, or making decisions.  Stress may cause physical symptoms, including the following:  Aches and pains. These may affect your head, neck, back, stomach, or other areas of your body.  Tight muscles or clenched jaw.  Low energy or trouble sleeping.  Stress may cause unhealthy behaviors, including the following:  Eating to feel better (overeating) or skipping meals.  Sleeping too little,  too much, or both.  Working too much or putting off tasks (procrastination).  Smoking, drinking alcohol, or using drugs to feel better.  How is this diagnosed? Stress is diagnosed through an assessment by your health care provider. Your health care provider will ask questions about your symptoms and any stressful life events.Your health care provider will also ask about your medical history and may order blood tests or other tests. Certain medical conditions and medicine can cause physical symptoms similar to stress. Mental illness can cause emotional symptoms and unhealthy behaviors similar to stress. Your health care provider may refer you to a mental health professional for further evaluation. How is this treated? Stress management is the recommended treatment for stress.The goals of stress management are reducing stressful life events and coping with stress in healthy ways. Techniques for reducing stressful life events include the following:  Stress identification. Self-monitor for stress and identify what causes stress for you. These skills may help you to avoid some stressful events.  Time management. Set your priorities, keep a calendar of events, and learn to say "no." These tools can help you avoid making too many commitments.  Techniques for coping with stress include the following:  Rethinking the problem. Try to think realistically about stressful events rather than ignoring them or overreacting. Try to find the positives in a stressful situation rather than focusing on the negatives.  Exercise. Physical exercise can release both physical and emotional tension. The key is to find a form of exercise you enjoy and do it regularly.  Relaxation techniques. These relax the body and  mind. Examples include yoga, meditation, tai chi, biofeedback, deep breathing, progressive muscle relaxation, listening to music, being out in nature, journaling, and other hobbies. Again, the key is to find  one or more that you enjoy and can do regularly.  Healthy lifestyle. Eat a balanced diet, get plenty of sleep, and do not smoke. Avoid using alcohol or drugs to relax.  Strong support network. Spend time with family, friends, or other people you enjoy being around.Express your feelings and talk things over with someone you trust.  Counseling or talktherapy with a mental health professional may be helpful if you are having difficulty managing stress on your own. Medicine is typically not recommended for the treatment of stress.Talk to your health care provider if you think you need medicine for symptoms of stress. Follow these instructions at home:  Keep all follow-up visits as directed by your health care provider.  Take all medicines as directed by your health care provider. Contact a health care provider if:  Your symptoms get worse or you start having new symptoms.  You feel overwhelmed by your problems and can no longer manage them on your own. Get help right away if:  You feel like hurting yourself or someone else. This information is not intended to replace advice given to you by your health care provider. Make sure you discuss any questions you have with your health care provider. Document Released: 12/18/2000 Document Revised: 11/30/2015 Document Reviewed: 02/16/2013 Elsevier Interactive Patient Education  2017 Elsevier Inc.  

## 2017-10-30 ENCOUNTER — Ambulatory Visit (INDEPENDENT_AMBULATORY_CARE_PROVIDER_SITE_OTHER): Payer: PRIVATE HEALTH INSURANCE | Admitting: Family Medicine

## 2017-10-30 ENCOUNTER — Ambulatory Visit (INDEPENDENT_AMBULATORY_CARE_PROVIDER_SITE_OTHER): Payer: PRIVATE HEALTH INSURANCE

## 2017-10-30 ENCOUNTER — Encounter: Payer: Self-pay | Admitting: Family Medicine

## 2017-10-30 VITALS — BP 138/89 | HR 95 | Temp 97.7°F | Ht 60.0 in | Wt 136.5 lb

## 2017-10-30 DIAGNOSIS — M79671 Pain in right foot: Secondary | ICD-10-CM

## 2017-10-30 DIAGNOSIS — M25511 Pain in right shoulder: Secondary | ICD-10-CM | POA: Diagnosis not present

## 2017-10-30 DIAGNOSIS — W19XXXA Unspecified fall, initial encounter: Secondary | ICD-10-CM

## 2017-10-30 DIAGNOSIS — M542 Cervicalgia: Secondary | ICD-10-CM | POA: Diagnosis not present

## 2017-10-30 DIAGNOSIS — M545 Low back pain: Secondary | ICD-10-CM

## 2017-10-30 NOTE — Progress Notes (Signed)
Subjective: CC: Fall PCP: Bennie Pierini, FNP [id number] [name] is Linda 57 y.o. female presenting to clinic today for:  1. Fall Patient reports that she [place] after she slipped in Linda puddle of water.  She notes that she was going around Linda column and slipped and fell onto her right knee and on her right outstretched hand.  She notes that she had some discomfort in the right shoulder, right upper back and right low back/hip but she thought that symptoms would improve after Linda couple of days.  She notes that symptoms seem to be getting worse since that time and she started having Linda popping sound and sensation out of the right shoulder.  She worries if perhaps she fractured something.  She notes that she sustained an injury Linda few years ago where she injured her right ankle and required surgery.  She has never required back surgeries or shoulder surgeries in the past.  She denies any upper extremity or lower extremity weakness but does note pain with weightbearing activities, particularly in the right upper extremity and neck.  She notes Linda burning sensation in her right low back, right foot and right shoulder.  She has been taking Advil 400 mg 3 times daily for pain.  No saddle anesthesia, fecal incontinence or urinary retention.  She does have some burning and numbness in the right foot when she crosses her legs but otherwise no sensation changes.   ROS: Per HPI  Allergies  Allergen Reactions  . Baclofen Nausea And Vomiting   Past Medical History:  Diagnosis Date  . HTN (hypertension)   . Hyperlipidemia   . Migraines     Current Outpatient Medications:  .  escitalopram (LEXAPRO) 20 MG tablet, Take 1 tablet (20 mg total) by mouth daily., Disp: 30 tablet, Rfl: 5 .  hydrochlorothiazide (HYDRODIURIL) 25 MG tablet, Take 1 tablet (25 mg total) by mouth daily., Disp: 90 tablet, Rfl: 1 .  zolpidem (AMBIEN) 5 MG tablet, Take 1 tablet (5 mg total) by mouth at  bedtime as needed. for sleep, Disp: 30 tablet, Rfl: 2 Social History   Socioeconomic History  . Marital status: Divorced    Spouse name: Not on file  . Number of children: Not on file  . Years of education: Not on file  . Highest education level: Not on file  Occupational History  . Not on file  Social Needs  . Financial resource strain: Not on file  . Food insecurity:    Worry: Not on file    Inability: Not on file  . Transportation needs:    Medical: Not on file    Non-medical: Not on file  Tobacco Use  . Smoking status: Current Every Day Smoker    Packs/day: 0.50    Types: Cigarettes    Start date: 10/02/1983  . Smokeless tobacco: Never Used  Substance and Sexual Activity  . Alcohol use: No  . Drug use: No  . Sexual activity: Not on file  Lifestyle  . Physical activity:    Days per week: Not on file    Minutes per session: Not on file  . Stress: Not on file  Relationships  . Social connections:    Talks on phone: Not on file    Gets together: Not on file    Attends religious service: Not on file    Active member of club or organization: Not on file    Attends meetings of clubs  or organizations: Not on file    Relationship status: Not on file  . Intimate partner violence:    Fear of current or ex partner: Not on file    Emotionally abused: Not on file    Physically abused: Not on file    Forced sexual activity: Not on file  Other Topics Concern  . Not on file  Social History Narrative  . Not on file   Family History  Problem Relation Age of Onset  . Diabetes Mother   . Heart disease Father   . Diabetes Father   . Healthy Sister   . Healthy Brother   . Healthy Sister     Objective: Office vital signs reviewed. BP 138/89   Pulse 95   Temp 97.7 F (36.5 C) (Oral)   Ht 5' (1.524 m)   Wt 136 lb 8 oz (61.9 kg)   LMP 07/17/2012   BMI 26.66 kg/m   Physical Examination:  General: Awake, alert, well nourished, nontoxic, No acute distress Extremities:  warm, well perfused, No edema, cyanosis or clubbing; +2 pulses bilaterally MSK: normal gait and normal station  C-spine: Patient has slight decreased active range of motion in extension and flexion of neck.  She has about Linda 15 degree loss of active range of motion and rotation of the neck to the right.  Left rotation is preserved.  No midline tenderness to palpation.  She does have marked paraspinal muscle tenderness to palpation, particularly on the right along the trapezius.  There is increased tonicity on the side as well.  No palpable bony abnormalities.  Thoracic spine: Patient has full active range of motion.  No midline tenderness palpation.  No paraspinal muscle tenderness to palpation.  Lumbar spine: Patient has full active range of motion in all planes but does have pain with extension of the spine.  No midline tenderness to palpation.  No palpable bony abnormalities.  She does have tenderness to palpation, particularly in the lower lumbar spine on the right.  There is increased tonicity of the musculature in this area as well.  She has pain in the hip with straight leg raise on the right. Skin: dry; intact; no rashes or lesions Neuro: 4/5 RLE Strength; 5/5 LLE strength.  Light touch sensation grossly intact, patellar DTRs 1/4 bilaterally.  Some difficulty with toe walking.  Normal heel walk.  No results found.  Assessment/ Plan: 57 y.o. female   1. Fall, initial encounter Now here with right shoulder pain, right low back pain and right-sided neck pain.  Physical exam is remarkable for muscle spasm in the C-spine and lumbar spine areas on the right.  No palpable bony abnormalities.  X-rays were obtained per patient's request which on my personal review demonstrated some degenerative changes within the C-spine but no acute abnormalities.  No visible bony abnormalities within the right shoulder.  Lumbar spine films notable for Linda slight anteriolisthesis of L4 on L5.  Unsure of chronicity of  this.  There is questionable degenerative disc changes within this level as well.  No acute fractures appreciated on my personal review.  Awaiting formal review by radiologist.  I informed patient that I would contact her with this information once it was available.  I advised her to continue taking the oral NSAID as she has currently been taking it.  Avoid other NSAIDs while taking the Motrin.  I offered her Linda muscle relaxer but she declined this since she has had adverse reactions to baclofen in the past,  she was reluctant to try any other muscle relaxants.  I think that this is fair.  She may use Linda muscle rub of her choice.  I recommended gentle stretching.  She was offered physical therapy referral but she declined this for now.  She is going to try current treatments and if symptoms are not improving within the next week or so, she will call me and I will place this order for her.  Additionally, patient did note during our exam that she was planning on having Linda lawyer review her case.  She was going to fill out release of information form to have our notes since to his office. - DG Shoulder Right; Future - DG Lumbar Spine 2-3 Views; Future - DG Cervical Spine Complete; Future  2. Neck pain As above. - DG Cervical Spine Complete; Future    Orders Placed This Encounter  Procedures  . DG Shoulder Right    Standing Status:   Future    Number of Occurrences:   1    Standing Expiration Date:   12/31/2018    Order Specific Question:   Reason for Exam (SYMPTOM  OR DIAGNOSIS REQUIRED)    Answer:   fall    Order Specific Question:   Is patient pregnant?    Answer:   No    Order Specific Question:   Preferred imaging location?    Answer:   Internal    Order Specific Question:   Radiology Contrast Protocol - do NOT remove file path    Answer:   \\[email or web address]\DXFluoroContrastProtocols.pdf  . DG Lumbar Spine 2-3 Views    Standing Status:   Future    Number of Occurrences:   1     Standing Expiration Date:   12/30/2018    Order Specific Question:   Reason for Exam (SYMPTOM  OR DIAGNOSIS REQUIRED)    Answer:   fall    Order Specific Question:   Is the patient pregnant?    Answer:   No    Order Specific Question:   Preferred imaging location?    Answer:   Internal  . DG Cervical Spine Complete    Standing Status:   Future    Number of Occurrences:   1    Standing Expiration Date:   12/31/2018    Order Specific Question:   Reason for Exam (SYMPTOM  OR DIAGNOSIS REQUIRED)    Answer:   fall, neck pain R    Order Specific Question:   Is patient pregnant?    Answer:   No    Order Specific Question:   Preferred imaging location?    Answer:   Internal    Order Specific Question:   Radiology Contrast Protocol - do NOT remove file path    Answer:   \\[email or web address]\Radiant\DXFluoroContrastProtocols.pdf   No orders of the defined types were placed in this encounter.    Raliegh IpAshly M Taevon Aschoff, DO [facility] [phone]

## 2017-11-04 ENCOUNTER — Ambulatory Visit: Payer: PRIVATE HEALTH INSURANCE | Admitting: Nurse Practitioner

## 2018-03-09 ENCOUNTER — Other Ambulatory Visit: Payer: Self-pay | Admitting: Nurse Practitioner

## 2018-03-09 DIAGNOSIS — F5101 Primary insomnia: Secondary | ICD-10-CM

## 2018-03-12 NOTE — Telephone Encounter (Signed)
appt scheduled for 03/31/18 with MMM. Pt aware

## 2018-03-31 ENCOUNTER — Ambulatory Visit (INDEPENDENT_AMBULATORY_CARE_PROVIDER_SITE_OTHER): Payer: PRIVATE HEALTH INSURANCE | Admitting: Nurse Practitioner

## 2018-03-31 ENCOUNTER — Encounter: Payer: Self-pay | Admitting: Nurse Practitioner

## 2018-03-31 VITALS — BP 129/89 | HR 92 | Temp 98.6°F | Ht 60.0 in | Wt 135.4 lb

## 2018-03-31 DIAGNOSIS — F5101 Primary insomnia: Secondary | ICD-10-CM

## 2018-03-31 MED ORDER — ZOLPIDEM TARTRATE 10 MG PO TABS: 10.0000 mg | ORAL_TABLET | Freq: Every evening | ORAL | 1 refills | Status: DC | PRN

## 2018-03-31 MED ORDER — ZOLPIDEM TARTRATE 10 MG PO TABS: 10.0000 mg | ORAL_TABLET | Freq: Every evening | ORAL | 2 refills | Status: DC | PRN

## 2018-03-31 NOTE — Progress Notes (Signed)
   Subjective:    Patient ID: [name], female    DOB: 1961/02/03, 57 y.o.   MRN: [id number]   Chief Complaint: Insomnia (Refill on Ambien - Patient would like it to be increased since she is still having trouble sleeping.)   HPI Patient comes in today to discuss her insomnia. She is currently on ambien 5 mg nightly. She says she has trouble falling asleep and staying asleep. Only sleeps about 1-2 hours a night.   Review of Systems  Constitutional: Negative for activity change and appetite change.  HENT: Negative.   Eyes: Negative for pain.  Respiratory: Negative for shortness of breath.   Cardiovascular: Negative for chest pain, palpitations and leg swelling.  Gastrointestinal: Negative for abdominal pain.  Endocrine: Negative for polydipsia.  Genitourinary: Negative.   Skin: Negative for rash.  Neurological: Negative for dizziness, weakness and headaches.  Hematological: Does not bruise/bleed easily.  Psychiatric/Behavioral: Negative.   All other systems reviewed and are negative.      Objective:   Physical Exam  Constitutional: She is oriented to person, place, and time. She appears well-developed and well-nourished. No distress.  Cardiovascular: Normal rate and regular rhythm.  Pulmonary/Chest: Effort normal.  Neurological: She is alert and oriented to person, place, and time.  Skin: Skin is warm.  Psychiatric: She has a normal mood and affect. Her behavior is normal. Thought content normal.  Nursing note and vitals reviewed.  BP 129/89   Pulse 92   Temp 98.6 F (37 C) (Oral)   Ht 5' (1.524 m)   Wt 135 lb 6.4 oz (61.4 kg)   LMP 07/17/2012   BMI 26.44 kg/m       Assessment & Plan:  [name] in today with chief complaint of Insomnia (Refill on Ambien - Patient would like it to be increased since she is still having trouble sleeping.)   1. Primary insomnia Bedtime routine rto PRN - zolpidem (AMBIEN) 10 MG tablet; Take 1 tablet (10 mg total)  by mouth at bedtime as needed for sleep.  Dispense: 15 tablet; Refill: 1  Mary-Margaret Daphine DeutscherMartin, FNP

## 2018-03-31 NOTE — Patient Instructions (Signed)

## 2018-08-29 ENCOUNTER — Ambulatory Visit (INDEPENDENT_AMBULATORY_CARE_PROVIDER_SITE_OTHER): Payer: Self-pay | Admitting: Physician Assistant

## 2018-08-29 ENCOUNTER — Encounter: Payer: Self-pay | Admitting: Physician Assistant

## 2018-08-29 VITALS — BP 127/92 | HR 89 | Temp 98.0°F | Ht 60.0 in | Wt 131.0 lb

## 2018-08-29 DIAGNOSIS — N3000 Acute cystitis without hematuria: Secondary | ICD-10-CM

## 2018-08-29 MED ORDER — SULFAMETHOXAZOLE-TRIMETHOPRIM 800-160 MG PO TABS: 1.0000 | ORAL_TABLET | Freq: Two times a day (BID) | ORAL | 0 refills | Status: DC

## 2018-08-29 NOTE — Patient Instructions (Signed)
Cystospaz.  Azo standard

## 2018-08-30 NOTE — Progress Notes (Signed)
BP (!) 127/92   Pulse 89   Temp 98 F (36.7 C) (Oral)   Ht 5' (1.524 m)   Wt 131 lb (59.4 kg)   LMP 07/17/2012   BMI 25.58 kg/m    Subjective:    Patient ID: [name], female    DOB: 06-20-1961, 58 y.o.   MRN: [id number]  HPI: [name] is a 58 y.o. female presenting on 08/29/2018 for Back Pain (mid- lower back pain - radiates into legs )  This patient has back pain in the mid and lower back. It is going into both legs. Started 4-5 days ago. Has taken limited medications for it. She has never that back problems before.  Very hard to move from sit to stand, especially after staying in one position for a while. No known injury.  Past Medical History:  Diagnosis Date  . HTN (hypertension)   . Hyperlipidemia   . Migraines    Relevant past medical, surgical, family and social history reviewed and updated as indicated. Interim medical history since our last visit reviewed. Allergies and medications reviewed and updated. DATA REVIEWED: CHART IN EPIC  Family History reviewed for pertinent findings.  Review of Systems  Constitutional: Negative.   HENT: Negative.   Eyes: Negative.   Respiratory: Negative.   Gastrointestinal: Negative.   Genitourinary: Negative.   Musculoskeletal: Positive for arthralgias, back pain and myalgias.    Allergies as of 08/29/2018      Reactions   Baclofen Nausea And Vomiting      Medication List       Accurate as of August 29, 2018 11:59 PM. Always use your most recent med list.        aspirin 81 MG chewable tablet Chew 81 mg by mouth daily.   hydrochlorothiazide 25 MG tablet Commonly known as:  HYDRODIURIL Take 1 tablet (25 mg total) by mouth daily.   sulfamethoxazole-trimethoprim 800-160 MG tablet Commonly known as:  BACTRIM DS Take 1 tablet by mouth 2 (two) times daily.   zolpidem 10 MG tablet Commonly known as:  AMBIEN Take 1 tablet (10 mg total) by mouth at bedtime as needed for sleep.            Objective:    BP (!) 127/92   Pulse 89   Temp 98 F (36.7 C) (Oral)   Ht 5' (1.524 m)   Wt 131 lb (59.4 kg)   LMP 07/17/2012   BMI 25.58 kg/m   Allergies  Allergen Reactions  . Baclofen Nausea And Vomiting    Wt Readings from Last 3 Encounters:  08/29/18 131 lb (59.4 kg)  03/31/18 135 lb 6.4 oz (61.4 kg)  10/30/17 136 lb 8 oz (61.9 kg)    Physical Exam Constitutional:      Appearance: She is well-developed.  HENT:     Head: Normocephalic and atraumatic.  Eyes:     Conjunctiva/sclera: Conjunctivae normal.     Pupils: Pupils are equal, round, and reactive to light.  Cardiovascular:     Rate and Rhythm: Normal rate and regular rhythm.     Heart sounds: Normal heart sounds.  Pulmonary:     Effort: Pulmonary effort is normal.     Breath sounds: Normal breath sounds.  Abdominal:     General: Bowel sounds are normal.     Palpations: Abdomen is soft.  Musculoskeletal:     Lumbar back: She exhibits decreased range of motion, tenderness and pain.       Back:  Skin:    General: Skin is warm and dry.     Findings: No rash.  Neurological:     Mental Status: She is alert and oriented to person, place, and time.     Deep Tendon Reflexes: Reflexes are normal and symmetric.  Psychiatric:        Behavior: Behavior normal.        Thought Content: Thought content normal.        Judgment: Judgment normal.         Assessment & Plan:   1. Acute cystitis without hematuria - Urinalysis - sulfamethoxazole-trimethoprim (BACTRIM DS) 800-160 MG tablet; Take 1 tablet by mouth 2 (two) times daily.  Dispense: 20 tablet; Refill: 0   Continue all other maintenance medications as listed above.  Follow up plan: No follow-ups on file.  Educational handout given for survey  Remus Loffler PA-C [facility] [address]  [place], [place] [address] [phone]   08/30/2018, 8:45 PM

## 2018-08-31 LAB — URINALYSIS
BILIRUBIN UA: NEGATIVE
GLUCOSE, UA: NEGATIVE
Ketones, UA: NEGATIVE
Leukocytes, UA: NEGATIVE
Nitrite, UA: NEGATIVE
Protein, UA: NEGATIVE
SPEC GRAV UA: 1.025 (ref 1.005–1.030)
UUROB: 0.2 mg/dL (ref 0.2–1.0)
pH, UA: 5.5 (ref 5.0–7.5)

## 2018-10-15 ENCOUNTER — Other Ambulatory Visit: Payer: Self-pay

## 2018-10-15 ENCOUNTER — Ambulatory Visit (INDEPENDENT_AMBULATORY_CARE_PROVIDER_SITE_OTHER): Payer: Self-pay | Admitting: Nurse Practitioner

## 2018-10-15 ENCOUNTER — Other Ambulatory Visit: Payer: Self-pay | Admitting: Nurse Practitioner

## 2018-10-15 ENCOUNTER — Encounter: Payer: Self-pay | Admitting: Nurse Practitioner

## 2018-10-15 DIAGNOSIS — I10 Essential (primary) hypertension: Secondary | ICD-10-CM

## 2018-10-15 DIAGNOSIS — E559 Vitamin D deficiency, unspecified: Secondary | ICD-10-CM

## 2018-10-15 DIAGNOSIS — E785 Hyperlipidemia, unspecified: Secondary | ICD-10-CM

## 2018-10-15 MED ORDER — HYDROCHLOROTHIAZIDE 25 MG PO TABS: 25.0000 mg | ORAL_TABLET | Freq: Every day | ORAL | 1 refills | Status: DC

## 2018-10-15 NOTE — Progress Notes (Signed)
Patient ID: [name], female   DOB: 05/12/1961, 58 y.o.   MRN: [id number]    Virtual Visit via telephone Note  I connected with [name] on 10/15/18 at 10 :40 AM by telephone and verified that I am speaking with the correct person using two identifiers. [name]  and grand kids is currently with her during visit. The provider, Mary-Margaret Daphine DeutscherMartin, FNP is located in their office at time of visit.  I discussed the limitations, risks, security and privacy concerns of performing an evaluation and management service by telephone and the availability of in person appointments. I also discussed with the patient that there may be Carr patient responsible charge related to this service. The patient expressed understanding and agreed to proceed.   History and Present Illness:   Chief Complaint: medical management of chronic issues  HPI:  1. Essential hypertension, benign  No c/o chest pain, sob or headache. doesnot check blood pressure at  Home. BP Readings from Last 3 Encounters:  08/29/18 (!) 127/92  03/31/18 129/89  10/30/17 138/89     2. Hyperlipidemia with target LDL less than 100  Does not watch diet or exercise. She is not on statin because caused muscle aches.  3. Vitamin D deficiency  Takes daily vitamin d supplement OTC daily.    Outpatient Encounter Medications as of 10/15/2018  Medication Sig  . aspirin 81 MG chewable tablet Chew 81 mg by mouth daily.  . hydrochlorothiazide (HYDRODIURIL) 25 MG tablet Take 1 tablet (25 mg total) by mouth daily.     New complaints: None today  Social history:  Is in [place] taking care of her grand cildren during this corona pandemic  Review of Systems  Constitutional: Negative for diaphoresis and weight loss.  Eyes: Negative for blurred vision, double vision and pain.  Respiratory: Negative for shortness of breath.   Cardiovascular: Negative for chest pain,  palpitations, orthopnea and leg swelling.  Gastrointestinal: Negative for abdominal pain.  Skin: Negative for rash.  Neurological: Negative for dizziness, sensory change, loss of consciousness, weakness and headaches.  Endo/Heme/Allergies: Negative for polydipsia. Does not bruise/bleed easily.  Psychiatric/Behavioral: Negative for memory loss. The patient does not have insomnia.   All other systems reviewed and are negative.      Observations/Objective: Alert and oriented- answers all questions appropriately  Assessment and Plan: [name] [name] comes in today with chief complaint of Medical Management of Chronic Issues   Diagnosis and orders addressed:  1. Essential hypertension, benign [place] sodium diet - hydrochlorothiazide (HYDRODIURIL) 25 MG tablet; Take 1 tablet (25 mg total) by mouth daily.  Dispense: 90 tablet; Refill: 1  2. Hyperlipidemia with target LDL less than 100 Low fat diet  Exercise encouraged  3. Vitamin D deficiency continiue daily vitamin d supplrmrnt  Previous labs reviewed Health Maintenance reviewed Diet and exercise encouraged  Follow up plan: 6 months     I discussed the assessment and treatment plan with the patient. The patient was provided an opportunity to ask questions and all were answered. The patient agreed with the plan and demonstrated an understanding of the instructions.   The patient was advised to call back or seek an in-person evaluation if the symptoms worsen or if the condition fails to improve as anticipated.  The above assessment and management plan was discussed with the patient. The patient verbalized understanding of and has agreed to the management plan. Patient is aware to call the  clinic if symptoms persist or worsen. Patient is aware when to return to the clinic for Carr follow-up visit. Patient educated on when it is appropriate to go to the emergency department.    I provided 12 minutes of non-face-to-face time during  this encounter.    Mary-Margaret Daphine Deutscher, FNP

## 2018-11-19 ENCOUNTER — Telehealth: Payer: Self-pay | Admitting: Nurse Practitioner

## 2019-02-22 ENCOUNTER — Encounter: Payer: Self-pay | Admitting: Nurse Practitioner

## 2019-02-22 ENCOUNTER — Ambulatory Visit (INDEPENDENT_AMBULATORY_CARE_PROVIDER_SITE_OTHER): Payer: Self-pay | Admitting: Nurse Practitioner

## 2019-02-22 ENCOUNTER — Other Ambulatory Visit: Payer: Self-pay

## 2019-02-22 ENCOUNTER — Telehealth: Payer: Self-pay | Admitting: Nurse Practitioner

## 2019-02-22 DIAGNOSIS — L02234 Carbuncle of groin: Secondary | ICD-10-CM

## 2019-02-22 MED ORDER — SULFAMETHOXAZOLE-TRIMETHOPRIM 800-160 MG PO TABS: 1.0000 | ORAL_TABLET | Freq: Two times a day (BID) | ORAL | 0 refills | Status: DC

## 2019-02-22 NOTE — Telephone Encounter (Signed)
Patient needs appt

## 2019-02-22 NOTE — Progress Notes (Signed)
   Virtual Visit via telephone Note Due to COVID-19 pandemic this visit was conducted virtually. This visit type was conducted due to national recommendations for restrictions regarding the COVID-19 Pandemic (e.g. social distancing, sheltering in place) in an effort to limit this patient's exposure and mitigate transmission in our community. All issues noted in this document were discussed and addressed.  A physical exam was not performed with this format.  I connected with [name] on 02/22/19 at 1100 by telephone and verified that I am speaking with the correct person using two identifiers. [name] is currently located at daughters house and her grand kids are currently with her during visit. The provider, Mary-Margaret Hassell Done, FNP is located in their office at time of visit.  I discussed the limitations, risks, security and privacy concerns of performing an evaluation and management service by telephone and the availability of in person appointments. I also discussed with the patient that there may be a patient responsible charge related to this service. The patient expressed understanding and agreed to proceed.   History and Present Illness:   Chief Complaint: Cyst   HPI Patient is in [place] taking care of her grandchildren. She has a cyst in labial area. She has had this before. It started draining 2 days. Foul smelling and tender to touch,     Review of Systems  Constitutional: Negative for diaphoresis and weight loss.  Eyes: Negative for blurred vision, double vision and pain.  Respiratory: Negative for shortness of breath.   Cardiovascular: Negative for chest pain, palpitations, orthopnea and leg swelling.  Gastrointestinal: Negative for abdominal pain.  Skin: Negative for rash.  Neurological: Negative for dizziness, sensory change, loss of consciousness, weakness and headaches.  Endo/Heme/Allergies: Negative for polydipsia. Does not bruise/bleed easily.   Psychiatric/Behavioral: Negative for memory loss. The patient does not have insomnia.   All other systems reviewed and are negative.    Observations/Objective: Alert and oriented- answers all questions apropriatly No distresss  Assessment and Plan: [name] in today with chief complaint of Cyst   1. Carbuncle, groin Meds ordered this encounter  Medications  . sulfamethoxazole-trimethoprim (BACTRIM DS) 800-160 MG tablet    Sig: Take 1 tablet by mouth 2 (two) times daily.    Dispense:  20 tablet    Refill:  0    Order Specific Question:   Supervising Provider    Answer:   Caryl Pina A [[id number]]   Warm compresses     Follow Up Instructions: prn    I discussed the assessment and treatment plan with the patient. The patient was provided an opportunity to ask questions and all were answered. The patient agreed with the plan and demonstrated an understanding of the instructions.   The patient was advised to call back or seek an in-person evaluation if the symptoms worsen or if the condition fails to improve as anticipated.  The above assessment and management plan was discussed with the patient. The patient verbalized understanding of and has agreed to the management plan. Patient is aware to call the clinic if symptoms persist or worsen. Patient is aware when to return to the clinic for a follow-up visit. Patient educated on when it is appropriate to go to the emergency department.   Time call ended:  11:12  I provided 12 minutes of non-face-to-face time during this encounter.    Mary-Margaret Hassell Done, FNP

## 2019-04-12 ENCOUNTER — Telehealth: Payer: Self-pay | Admitting: Nurse Practitioner

## 2019-08-12 ENCOUNTER — Other Ambulatory Visit: Payer: Self-pay

## 2019-08-12 ENCOUNTER — Telehealth: Payer: Self-pay | Admitting: Nurse Practitioner

## 2019-08-13 ENCOUNTER — Ambulatory Visit (INDEPENDENT_AMBULATORY_CARE_PROVIDER_SITE_OTHER): Payer: Medicare Other | Admitting: Nurse Practitioner

## 2019-08-13 ENCOUNTER — Encounter: Payer: Self-pay | Admitting: Nurse Practitioner

## 2019-08-13 ENCOUNTER — Ambulatory Visit (INDEPENDENT_AMBULATORY_CARE_PROVIDER_SITE_OTHER): Payer: Medicare Other

## 2019-08-13 VITALS — BP 142/89 | HR 93 | Temp 98.4°F | Resp 20 | Ht 60.0 in | Wt 135.0 lb

## 2019-08-13 DIAGNOSIS — Z0000 Encounter for general adult medical examination without abnormal findings: Secondary | ICD-10-CM | POA: Diagnosis not present

## 2019-08-13 DIAGNOSIS — G43109 Migraine with aura, not intractable, without status migrainosus: Secondary | ICD-10-CM

## 2019-08-13 DIAGNOSIS — I10 Essential (primary) hypertension: Secondary | ICD-10-CM

## 2019-08-13 DIAGNOSIS — F5101 Primary insomnia: Secondary | ICD-10-CM

## 2019-08-13 DIAGNOSIS — F17200 Nicotine dependence, unspecified, uncomplicated: Secondary | ICD-10-CM

## 2019-08-13 DIAGNOSIS — E785 Hyperlipidemia, unspecified: Secondary | ICD-10-CM

## 2019-08-13 MED ORDER — ZOLPIDEM TARTRATE 10 MG PO TABS: 10.0000 mg | ORAL_TABLET | Freq: Every evening | ORAL | 1 refills | Status: DC | PRN

## 2019-08-13 NOTE — Addendum Note (Signed)
Addended by: Bennie Pierini on: 08/13/2019 12:49 PM   Modules accepted: Orders

## 2019-08-13 NOTE — Patient Instructions (Signed)
Steps to Quit Smoking Smoking tobacco is the leading cause of preventable death. It can affect almost every organ in the body. Smoking puts you and people around you at risk for many serious, long-lasting (chronic) diseases. Quitting smoking can be hard, but it is one of the best things that you can do for your health. It is never too late to quit. How do I get ready to quit? When you decide to quit smoking, make a plan to help you succeed. Before you quit:  Pick a date to quit. Set a date within the next 2 weeks to give you time to prepare.  Write down the reasons why you are quitting. Keep this list in places where you will see it often.  Tell your family, friends, and co-workers that you are quitting. Their support is important.  Talk with your doctor about the choices that may help you quit.  Find out if your health insurance will pay for these treatments.  Know the people, places, things, and activities that make you want to smoke (triggers). Avoid them. What first steps can I take to quit smoking?  Throw away all cigarettes at home, at work, and in your car.  Throw away the things that you use when you smoke, such as ashtrays and lighters.  Clean your car. Make sure to empty the ashtray.  Clean your home, including curtains and carpets. What can I do to help me quit smoking? Talk with your doctor about taking medicines and seeing a counselor at the same time. You are more likely to succeed when you do both.  If you are pregnant or breastfeeding, talk with your doctor about counseling or other ways to quit smoking. Do not take medicine to help you quit smoking unless your doctor tells you to do so. To quit smoking: Quit right away  Quit smoking totally, instead of slowly cutting back on how much you smoke over a period of time.  Go to counseling. You are more likely to quit if you go to counseling sessions regularly. Take medicine You may take medicines to help you quit. Some  medicines need a prescription, and some you can buy over-the-counter. Some medicines may contain a drug called nicotine to replace the nicotine in cigarettes. Medicines may:  Help you to stop having the desire to smoke (cravings).  Help to stop the problems that come when you stop smoking (withdrawal symptoms). Your doctor may ask you to use:  Nicotine patches, gum, or lozenges.  Nicotine inhalers or sprays.  Non-nicotine medicine that is taken by mouth. Find resources Find resources and other ways to help you quit smoking and remain smoke-free after you quit. These resources are most helpful when you use them often. They include:  Online chats with a counselor.  Phone quitlines.  Printed self-help materials.  Support groups or group counseling.  Text messaging programs.  Mobile phone apps. Use apps on your mobile phone or tablet that can help you stick to your quit plan. There are many free apps for mobile phones and tablets as well as websites. Examples include Quit Guide from the CDC and smokefree.gov  What things can I do to make it easier to quit?   Talk to your family and friends. Ask them to support and encourage you.  Call a phone quitline (1-800-QUIT-NOW), reach out to support groups, or work with a counselor.  Ask people who smoke to not smoke around you.  Avoid places that make you want to smoke,   such as: ? Bars. ? Parties. ? Smoke-break areas at work.  Spend time with people who do not smoke.  Lower the stress in your life. Stress can make you want to smoke. Try these things to help your stress: ? Getting regular exercise. ? Doing deep-breathing exercises. ? Doing yoga. ? Meditating. ? Doing a body scan. To do this, close your eyes, focus on one area of your body at a time from head to toe. Notice which parts of your body are tense. Try to relax the muscles in those areas. How will I feel when I quit smoking? Day 1 to 3 weeks Within the first 24 hours,  you may start to have some problems that come from quitting tobacco. These problems are very bad 2-3 days after you quit, but they do not often last for more than 2-3 weeks. You may get these symptoms:  Mood swings.  Feeling restless, nervous, angry, or annoyed.  Trouble concentrating.  Dizziness.  Strong desire for high-sugar foods and nicotine.  Weight gain.  Trouble pooping (constipation).  Feeling like you may vomit (nausea).  Coughing or a sore throat.  Changes in how the medicines that you take for other issues work in your body.  Depression.  Trouble sleeping (insomnia). Week 3 and afterward After the first 2-3 weeks of quitting, you may start to notice more positive results, such as:  Better sense of smell and taste.  Less coughing and sore throat.  Slower heart rate.  Lower blood pressure.  Clearer skin.  Better breathing.  Fewer sick days. Quitting smoking can be hard. Do not give up if you fail the first time. Some people need to try a few times before they succeed. Do your best to stick to your quit plan, and talk with your doctor if you have any questions or concerns. Summary  Smoking tobacco is the leading cause of preventable death. Quitting smoking can be hard, but it is one of the best things that you can do for your health.  When you decide to quit smoking, make a plan to help you succeed.  Quit smoking right away, not slowly over a period of time.  When you start quitting, seek help from your doctor, family, or friends. This information is not intended to replace advice given to you by your health care provider. Make sure you discuss any questions you have with your health care provider. Document Revised: 03/19/2019 Document Reviewed: 09/12/2018 Elsevier Patient Education  2020 Elsevier Inc.  

## 2019-08-13 NOTE — Progress Notes (Signed)
Subjective:    Patient ID: [name], female    DOB: [birth year], 59 y.o.   MRN: [id number]   Chief Complaint: Annual Exam    HPI:  1. Essential hypertension, benign Checks BP when at home but not at daughter's while in [place]. Did some walking when able about a mile. No chest pain, SOB, headaches, dizziness.  BP Readings from Last 3 Encounters:  08/29/18 (!) 127/92  03/31/18 129/89  10/30/17 138/89     2. Migraine with aura and without status migrainosus, not intractable None recently. Last one was years ago.  3. Hyperlipidemia with target LDL less than 100 Reports she has been eating better. Does smoke but has cut down to 1/2 pack a day. May try to quit again.  Lab Results  Component Value Date   CHOL 227 (H) 09/11/2017   HDL 28 (L) 09/11/2017   LDLCALC 133 (H) 09/11/2017   TRIG 328 (H) 09/11/2017   CHOLHDL 8.1 (H) 09/11/2017       Outpatient Encounter Medications as of 08/13/2019  Medication Sig  . aspirin 81 MG chewable tablet Chew 81 mg by mouth daily.  . hydrochlorothiazide (HYDRODIURIL) 25 MG tablet Take 1 tablet (25 mg total) by mouth daily.  Marland Kitchen sulfamethoxazole-trimethoprim (BACTRIM DS) 800-160 MG tablet Take 1 tablet by mouth 2 (two) times daily.   No facility-administered encounter medications on file as of 08/13/2019.    Past Surgical History:  Procedure Laterality Date  . CHOLECYSTECTOMY    . TUBAL LIGATION      Family History  Problem Relation Age of Onset  . Diabetes Mother   . Heart disease Father   . Diabetes Father   . Healthy Sister   . Healthy Brother   . Healthy Sister     New complaints: None  Social history: Son stays with her. Was staying with daughter in [place] from February 2020 until November 2020.   Controlled substance contract: N/A     Review of Systems  Constitutional: Negative.   HENT: Negative.   Eyes: Negative.   Respiratory: Negative.   Cardiovascular: Negative.   Gastrointestinal:  Negative.   Endocrine: Negative.   Genitourinary: Positive for genital sores.       1 cm cebaceous cyst on left labia majora  Musculoskeletal: Negative.   Skin: Negative.   Allergic/Immunologic: Negative.   Neurological: Negative.   Hematological: Negative.   Psychiatric/Behavioral: Positive for sleep disturbance.       Falls asleep but wakes up and unable to go back to sleep.    BP (!) 142/89   Pulse 93   Temp 98.4 F (36.9 C) (Temporal)   Resp 20   Ht 5' (1.524 m)   Wt 135 lb (61.2 kg)   LMP 07/17/2012   SpO2 100%   BMI 26.37 kg/m      Objective:   Physical Exam Vitals and nursing note reviewed. Exam conducted with a chaperone present.  Constitutional:      Appearance: Normal appearance.  HENT:     Head: Normocephalic.     Right Ear: Tympanic membrane, ear canal and external ear normal.     Left Ear: Tympanic membrane, ear canal and external ear normal.     Nose: Nose normal.     Mouth/Throat:     Mouth: Mucous membranes are dry.     Pharynx: Oropharynx is clear.  Eyes:     Extraocular Movements: Extraocular movements intact.     Conjunctiva/sclera: Conjunctivae normal.  Pupils: Pupils are equal, round, and reactive to light.  Cardiovascular:     Rate and Rhythm: Normal rate and regular rhythm.     Pulses: Normal pulses.     Heart sounds: Normal heart sounds.  Pulmonary:     Effort: Pulmonary effort is normal.     Breath sounds: Normal breath sounds.  Abdominal:     General: Bowel sounds are normal.  Genitourinary:    General: Normal vulva.     Labia:        Left: Lesion present.      Rectum: Normal.       Comments: 1 cm sebaceous cyst No adnexal masses or tenderness Musculoskeletal:        General: Normal range of motion.     Cervical back: Normal range of motion and neck supple.  Skin:    General: Skin is warm and dry.  Neurological:     General: No focal deficit present.     Mental Status: She is alert and oriented to person, place, and  time.  Psychiatric:        Mood and Affect: Mood normal.        Behavior: Behavior normal.        Thought Content: Thought content normal.        Judgment: Judgment normal.     BP (!) 142/89   Pulse 93   Temp 98.4 F (36.9 C) (Temporal)   Resp 20   Ht 5' (1.524 m)   Wt 135 lb (61.2 kg)   LMP 07/17/2012   SpO2 100%   BMI 26.37 kg/m   chest xray- chronic bronchitic changes-Preliminary reading by Ronnald Collum, FNP  [facility] Adella Nissen, FNP       Assessment & Plan:   [name] comes in today with chief complaint of Annual Exam   Diagnosis and orders addressed:  1. Annual physical exam  2. Essential hypertension, benign Low sodium diet - DG Chest 2 View; Future - EKG 12-Lead  3. Migraine with aura and without status migrainosus, not intractable Avoid caffeine  4. Hyperlipidemia with target LDL less than 100 Low fat diet  5. Smoker Smoking cessation encouraged  6. insomnia Bedtime routine ambien 10mg  1 po qhs. #30 2 refills  Labs pending Health Maintenance reviewed Diet and exercise encouraged  Follow up plan: 6 months   [place], FNP

## 2019-08-14 LAB — CBC WITH DIFFERENTIAL/PLATELET
Basophils Absolute: 0.1 10*3/uL (ref 0.0–0.2)
Basos: 1 %
EOS (ABSOLUTE): 0.1 10*3/uL (ref 0.0–0.4)
Eos: 1 %
Hematocrit: 42.9 % (ref 34.0–46.6)
Hemoglobin: 14.8 g/dL (ref 11.1–15.9)
Immature Grans (Abs): 0 10*3/uL (ref 0.0–0.1)
Immature Granulocytes: 0 %
Lymphocytes Absolute: 2.5 10*3/uL (ref 0.7–3.1)
Lymphs: 29 %
MCH: 30.3 pg (ref 26.6–33.0)
MCHC: 34.5 g/dL (ref 31.5–35.7)
MCV: 88 fL (ref 79–97)
Monocytes Absolute: 0.5 10*3/uL (ref 0.1–0.9)
Monocytes: 5 %
Neutrophils Absolute: 5.3 10*3/uL (ref 1.4–7.0)
Neutrophils: 64 %
Platelets: 282 10*3/uL (ref 150–450)
RBC: 4.88 x10E6/uL (ref 3.77–5.28)
RDW: 12.5 % (ref 11.7–15.4)
WBC: 8.4 10*3/uL (ref 3.4–10.8)

## 2019-08-14 LAB — CMP14+EGFR
ALT: 26 IU/L (ref 0–32)
AST: 25 IU/L (ref 0–40)
Albumin/Globulin Ratio: 1.8 (ref 1.2–2.2)
Albumin: 4.6 g/dL (ref 3.8–4.9)
Alkaline Phosphatase: 82 IU/L (ref 39–117)
BUN/Creatinine Ratio: 11 (ref 9–23)
BUN: 8 mg/dL (ref 6–24)
Bilirubin Total: 0.2 mg/dL (ref 0.0–1.2)
CO2: 25 mmol/L (ref 20–29)
Calcium: 9.7 mg/dL (ref 8.7–10.2)
Chloride: 103 mmol/L (ref 96–106)
Creatinine, Ser: 0.74 mg/dL (ref 0.57–1.00)
GFR calc Af Amer: 104 mL/min/{1.73_m2} (ref >59)
GFR calc non Af Amer: 90 mL/min/{1.73_m2} (ref >59)
Globulin, Total: 2.6 g/dL (ref 1.5–4.5)
Glucose: 95 mg/dL (ref 65–99)
Potassium: 3.1 mmol/L — ABNORMAL LOW (ref 3.5–5.2)
Sodium: 144 mmol/L (ref 134–144)
Total Protein: 7.2 g/dL (ref 6.0–8.5)

## 2019-08-14 LAB — THYROID PANEL WITH TSH
Free Thyroxine Index: 2.6 (ref 1.2–4.9)
T3 Uptake Ratio: 25 % (ref 24–39)
T4, Total: 10.2 ug/dL (ref 4.5–12.0)
TSH: 0.496 u[IU]/mL (ref 0.450–4.500)

## 2019-08-14 LAB — LIPID PANEL
Chol/HDL Ratio: 7.2 ratio — ABNORMAL HIGH (ref 0.0–4.4)
Cholesterol, Total: 224 mg/dL — ABNORMAL HIGH (ref 100–199)
HDL: 31 mg/dL — ABNORMAL LOW (ref >39)
LDL Chol Calc (NIH): 151 mg/dL — ABNORMAL HIGH (ref 0–99)
Triglycerides: 229 mg/dL — ABNORMAL HIGH (ref 0–149)
VLDL Cholesterol Cal: 42 mg/dL — ABNORMAL HIGH (ref 5–40)

## 2019-08-16 ENCOUNTER — Other Ambulatory Visit: Payer: Self-pay | Admitting: Nurse Practitioner

## 2019-08-16 MED ORDER — ATORVASTATIN CALCIUM 40 MG PO TABS: 40.0000 mg | ORAL_TABLET | Freq: Every day | ORAL | 1 refills | Status: DC

## 2019-08-16 MED ORDER — FENOFIBRATE 145 MG PO TABS: 145.0000 mg | ORAL_TABLET | Freq: Every day | ORAL | 11 refills | Status: DC

## 2019-08-18 LAB — IGP,CTNGTV,APT HPV
Chlamydia, Nuc. Acid Amp: NEGATIVE
Gonococcus, Nuc. Acid Amp: NEGATIVE
HPV Aptima: NEGATIVE
Trich vag by NAA: NEGATIVE

## 2019-09-10 ENCOUNTER — Ambulatory Visit (INDEPENDENT_AMBULATORY_CARE_PROVIDER_SITE_OTHER): Payer: Medicare Other | Admitting: Nurse Practitioner

## 2019-09-10 ENCOUNTER — Other Ambulatory Visit: Payer: Self-pay

## 2019-09-10 ENCOUNTER — Telehealth: Payer: Self-pay | Admitting: Family Medicine

## 2019-09-10 ENCOUNTER — Encounter: Payer: Self-pay | Admitting: Nurse Practitioner

## 2019-09-10 VITALS — BP 138/88 | HR 97 | Temp 99.1°F | Resp 20 | Ht 60.0 in | Wt 133.0 lb

## 2019-09-10 DIAGNOSIS — R29898 Other symptoms and signs involving the musculoskeletal system: Secondary | ICD-10-CM | POA: Diagnosis not present

## 2019-09-10 NOTE — Telephone Encounter (Signed)
Patient is going to make appt °

## 2019-09-10 NOTE — Patient Instructions (Signed)

## 2019-09-10 NOTE — Progress Notes (Signed)
   Subjective:    Patient ID: [name], female    DOB: 16-Oct-1960, 59 y.o.   MRN: [id number]   Chief Complaint: Fall (Wants MRI from fall last year)   HPI Patient says she was walking through her house last night. She does not know what mae her fall. She says she did not trip over anything or nothing. She says she does not feel that her legs are giving away. She says that she started having falls after she had her ankle surgery in 2018. Has slowed down but she has fallen 4-5 times in the last year. They are random falls. But she always falls to the right side. She does not wear her ankle brace anymore. She thinks her ankle weakness is what is making her fall. She had physical therapy for weeks after she had her ankle surgery. She denies any injuries when she falls.    Review of Systems  All other systems reviewed and are negative.      Objective:   Physical Exam Vitals and nursing note reviewed.  Constitutional:      Appearance: Normal appearance.  Cardiovascular:     Rate and Rhythm: Normal rate and regular rhythm.     Heart sounds: Normal heart sounds.  Pulmonary:     Breath sounds: Normal breath sounds.  Musculoskeletal:     Comments: Right ankle and foot strength appear normal DTR right ankle normal.  Skin:    General: Skin is warm.  Neurological:     General: No focal deficit present.     Mental Status: She is alert and oriented to person, place, and time.  Psychiatric:        Behavior: Behavior normal.     BP 138/88   Pulse 97   Temp 99.1 F (37.3 C) (Temporal)   Resp 20   Ht 5' (1.524 m)   Wt 133 lb (60.3 kg)   LMP 07/17/2012   SpO2 98%   BMI 25.97 kg/m        Assessment & Plan:  [name] in today with chief complaint of Fall (Wants MRI from fall last year)   1. Ankle weakness Wear ankle brace till sees ortho Walk slowly RTO prn - Ambulatory referral to Orthopedic Surgery    The above assessment and management plan was  discussed with the patient. The patient verbalized understanding of and has agreed to the management plan. Patient is aware to call the clinic if symptoms persist or worsen. Patient is aware when to return to the clinic for a follow-up visit. Patient educated on when it is appropriate to go to the emergency department.   Mary-Margaret Daphine Deutscher, FNP

## 2019-09-15 ENCOUNTER — Other Ambulatory Visit: Payer: Self-pay | Admitting: Nurse Practitioner

## 2019-09-15 ENCOUNTER — Telehealth: Payer: Self-pay | Admitting: Nurse Practitioner

## 2019-09-15 DIAGNOSIS — F5101 Primary insomnia: Secondary | ICD-10-CM

## 2019-09-15 NOTE — Telephone Encounter (Signed)
Last office visit 09/10/19 Last refill 08/13/19, #15, 1 refill

## 2019-09-16 NOTE — Telephone Encounter (Signed)
Called pt = she says neck/ cervical MRI order is needed

## 2019-09-16 NOTE — Telephone Encounter (Signed)
MRI of neck or shoulder

## 2019-09-17 ENCOUNTER — Telehealth: Payer: Self-pay | Admitting: Nurse Practitioner

## 2019-09-17 NOTE — Telephone Encounter (Signed)
Aware. Script ready for MRI order.

## 2019-09-17 NOTE — Telephone Encounter (Signed)
Ok to hand write order for cervical MRI without contrsat

## 2019-09-17 NOTE — Telephone Encounter (Signed)
Pt called to let MMM know that she spoke with [facility] about MRI and was told that we needed to fax it to them.

## 2019-09-20 NOTE — Telephone Encounter (Signed)
She will bring script for MRI back to [facility] to be faxed over to orthopedics, due to covid restrictions.

## 2019-09-30 ENCOUNTER — Other Ambulatory Visit: Payer: Self-pay | Admitting: Nurse Practitioner

## 2019-09-30 DIAGNOSIS — F5101 Primary insomnia: Secondary | ICD-10-CM

## 2019-10-13 ENCOUNTER — Telehealth: Payer: Self-pay | Admitting: Nurse Practitioner

## 2019-10-13 DIAGNOSIS — M4802 Spinal stenosis, cervical region: Secondary | ICD-10-CM

## 2019-10-13 NOTE — Telephone Encounter (Signed)
She wants the referral to nuerosurgeon.
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
## 2019-10-13 NOTE — Telephone Encounter (Signed)
CD ,with MRI test,   was placed in provider's inbox in her office. Patient aware, provider will return to office on Friday.

## 2019-10-15 ENCOUNTER — Other Ambulatory Visit: Payer: Self-pay | Admitting: Nurse Practitioner

## 2019-10-15 DIAGNOSIS — F5101 Primary insomnia: Secondary | ICD-10-CM

## 2019-10-18 ENCOUNTER — Telehealth: Payer: Self-pay | Admitting: Nurse Practitioner

## 2019-10-18 NOTE — Telephone Encounter (Signed)
Disc from [facility] (MR cevrerical spine) was placed up front for patient to pick up. Report for the exam has not been scanned into the chart.  Will check with referral cordinator to verify that the report has been sent to neurology for the patient.

## 2019-10-19 NOTE — Telephone Encounter (Signed)
Copy of MRI faxed to [facility]

## 2019-11-12 ENCOUNTER — Telehealth: Payer: Self-pay | Admitting: Nurse Practitioner

## 2019-11-12 DIAGNOSIS — I10 Essential (primary) hypertension: Secondary | ICD-10-CM

## 2019-11-12 MED ORDER — HYDROCHLOROTHIAZIDE 25 MG PO TABS: 25.0000 mg | ORAL_TABLET | Freq: Every day | ORAL | 1 refills | Status: DC

## 2019-11-12 NOTE — Telephone Encounter (Signed)
Requesting refill on [facility]. Has not been filled since 2020.    Please review and fill if appropriate.

## 2019-11-12 NOTE — Telephone Encounter (Signed)
  Prescription Request  11/12/2019  What is the name of the medication or equipment? hydrochlorothiazide (HYDRODIURIL) 25 MG tablet  Have you contacted your pharmacy to request a refill? (if applicable) no  Which pharmacy would you like this sent to? [facility]   Patient notified that their request is being sent to the clinical staff for review and that they should receive a response within 2 business days.

## 2019-11-25 ENCOUNTER — Other Ambulatory Visit: Payer: Self-pay

## 2019-11-25 ENCOUNTER — Ambulatory Visit (INDEPENDENT_AMBULATORY_CARE_PROVIDER_SITE_OTHER): Payer: Medicare Other | Admitting: Family Medicine

## 2019-11-25 ENCOUNTER — Encounter: Payer: Self-pay | Admitting: Family Medicine

## 2019-11-25 VITALS — BP 126/81 | HR 87 | Temp 98.3°F | Ht 60.0 in | Wt 130.5 lb

## 2019-11-25 DIAGNOSIS — M4802 Spinal stenosis, cervical region: Secondary | ICD-10-CM | POA: Diagnosis not present

## 2019-11-25 DIAGNOSIS — S39012A Strain of muscle, fascia and tendon of lower back, initial encounter: Secondary | ICD-10-CM

## 2019-11-25 DIAGNOSIS — M5410 Radiculopathy, site unspecified: Secondary | ICD-10-CM
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
## 2020-01-16 ENCOUNTER — Other Ambulatory Visit: Payer: Self-pay | Admitting: Nurse Practitioner

## 2020-01-16 DIAGNOSIS — F5101 Primary insomnia: Secondary | ICD-10-CM

## 2020-01-17 NOTE — Telephone Encounter (Signed)
Ambien rx denied  ntbs in office for controlled substance

## 2020-01-18 MED ORDER — ZOLPIDEM TARTRATE 10 MG PO TABS: 10.0000 mg | ORAL_TABLET | Freq: Every evening | ORAL | 0 refills | Status: DC | PRN

## 2020-01-18 NOTE — Telephone Encounter (Signed)
Pt has her 6 month f/u appt with you on 02/16/20. At her appt 08/13/19 she was told to follow up in 6 months and it says that in the f/u recommendations in notes. Can you send in Ambien to get her through to her appt 02/16/20?

## 2020-01-18 NOTE — Addendum Note (Signed)
Addended by: Bennie Pierini on: 01/18/2020 04:54 PM   Modules accepted: Orders

## 2020-02-16 ENCOUNTER — Other Ambulatory Visit: Payer: Self-pay

## 2020-02-16 ENCOUNTER — Encounter: Payer: Self-pay | Admitting: Nurse Practitioner

## 2020-02-16 ENCOUNTER — Ambulatory Visit (INDEPENDENT_AMBULATORY_CARE_PROVIDER_SITE_OTHER): Payer: Medicare Other | Admitting: Nurse Practitioner

## 2020-02-16 VITALS — BP 138/93 | HR 83 | Temp 98.2°F | Resp 20 | Ht 60.0 in | Wt 133.0 lb

## 2020-02-16 DIAGNOSIS — E559 Vitamin D deficiency, unspecified: Secondary | ICD-10-CM

## 2020-02-16 DIAGNOSIS — G43109 Migraine with aura, not intractable, without status migrainosus: Secondary | ICD-10-CM | POA: Diagnosis not present

## 2020-02-16 DIAGNOSIS — E785 Hyperlipidemia, unspecified: Secondary | ICD-10-CM | POA: Diagnosis not present

## 2020-02-16 DIAGNOSIS — F5101 Primary insomnia: Secondary | ICD-10-CM

## 2020-02-16 DIAGNOSIS — I10 Essential (primary) hypertension: Secondary | ICD-10-CM

## 2020-02-16 MED ORDER — ZOLPIDEM TARTRATE 10 MG PO TABS: 10.0000 mg | ORAL_TABLET | Freq: Every evening | ORAL | 0 refills | Status: DC | PRN

## 2020-02-16 MED ORDER — FENOFIBRATE 145 MG PO TABS: 145.0000 mg | ORAL_TABLET | Freq: Every day | ORAL | 11 refills | Status: DC

## 2020-02-16 MED ORDER — ATORVASTATIN CALCIUM 40 MG PO TABS: 40.0000 mg | ORAL_TABLET | Freq: Every day | ORAL | 1 refills | Status: DC

## 2020-02-16 MED ORDER — HYDROCHLOROTHIAZIDE 25 MG PO TABS: 25.0000 mg | ORAL_TABLET | Freq: Every day | ORAL | 1 refills | Status: DC

## 2020-02-16 NOTE — Patient Instructions (Signed)
Exercising to Stay Healthy To become healthy and stay healthy, it is recommended that you do moderate-intensity and vigorous-intensity exercise. You can tell that you are exercising at a moderate intensity if your heart starts beating faster and you start breathing faster but can still hold a conversation. You can tell that you are exercising at a vigorous intensity if you are breathing much harder and faster and cannot hold a conversation while exercising. Exercising regularly is important. It has many health benefits, such as:  Improving overall fitness, flexibility, and endurance.  Increasing bone density.  Helping with weight control.  Decreasing body fat.  Increasing muscle strength.  Reducing stress and tension.  Improving overall health. How often should I exercise? Choose an activity that you enjoy, and set realistic goals. Your health care provider can help you make an activity plan that works for you. Exercise regularly as told by your health care provider. This may include:  Doing strength training two times a week, such as: ? Lifting weights. ? Using resistance bands. ? Push-ups. ? Sit-ups. ? Yoga.  Doing a certain intensity of exercise for a given amount of time. Choose from these options: ? A total of 150 minutes of moderate-intensity exercise every week. ? A total of 75 minutes of vigorous-intensity exercise every week. ? A mix of moderate-intensity and vigorous-intensity exercise every week. Children, pregnant women, people who have not exercised regularly, people who are overweight, and older adults may need to talk with a health care provider about what activities are safe to do. If you have a medical condition, be sure to talk with your health care provider before you start a new exercise program. What are some exercise ideas? Moderate-intensity exercise ideas include:  Walking 1 mile (1.6 km) in about 15  minutes.  Biking.  Hiking.  Golfing.  Dancing.  Water aerobics. Vigorous-intensity exercise ideas include:  Walking 4.5 miles (7.2 km) or more in about 1 hour.  Jogging or running 5 miles (8 km) in about 1 hour.  Biking 10 miles (16.1 km) or more in about 1 hour.  Lap swimming.  Roller-skating or in-line skating.  Cross-country skiing.  Vigorous competitive sports, such as football, basketball, and soccer.  Jumping rope.  Aerobic dancing. What are some everyday activities that can help me to get exercise?  Yard work, such as: ? Pushing a lawn mower. ? Raking and bagging leaves.  Washing your car.  Pushing a stroller.  Shoveling snow.  Gardening.  Washing windows or floors. How can I be more active in my day-to-day activities?  Use stairs instead of an elevator.  Take a walk during your lunch break.  If you drive, park your car farther away from your work or school.  If you take public transportation, get off one stop early and walk the rest of the way.  Stand up or walk around during all of your indoor phone calls.  Get up, stretch, and walk around every 30 minutes throughout the day.  Enjoy exercise with a friend. Support to continue exercising will help you keep a regular routine of activity. What guidelines can I follow while exercising?  Before you start a new exercise program, talk with your health care provider.  Do not exercise so much that you hurt yourself, feel dizzy, or get very short of breath.  Wear comfortable clothes and wear shoes with good support.  Drink plenty of water while you exercise to prevent dehydration or heat stroke.  Work out until your breathing   and your heartbeat get faster. Where to find more information  [place] Department of Health and Human Services: www.hhs.gov  Centers for Disease Control and Prevention (CDC): www.cdc.gov Summary  Exercising regularly is important. It will improve your overall fitness,  flexibility, and endurance.  Regular exercise also will improve your overall health. It can help you control your weight, reduce stress, and improve your bone density.  Do not exercise so much that you hurt yourself, feel dizzy, or get very short of breath.  Before you start a new exercise program, talk with your health care provider. This information is not intended to replace advice given to you by your health care provider. Make sure you discuss any questions you have with your health care provider. Document Revised: 06/06/2017 Document Reviewed: 05/15/2017 Elsevier Patient Education  2020 [employer] Inc[employer]  
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
## 2020-02-17 ENCOUNTER — Other Ambulatory Visit: Payer: Self-pay | Admitting: Nurse Practitioner

## 2020-02-17 DIAGNOSIS — F5101 Primary insomnia: Secondary | ICD-10-CM

## 2020-02-23 DIAGNOSIS — M5481 Occipital neuralgia: Secondary | ICD-10-CM | POA: Insufficient documentation

## 2020-03-18 ENCOUNTER — Other Ambulatory Visit: Payer: Self-pay | Admitting: Nurse Practitioner

## 2020-03-18 DIAGNOSIS — F5101 Primary insomnia: Secondary | ICD-10-CM

## 2020-04-07 DIAGNOSIS — M47816 Spondylosis without myelopathy or radiculopathy, lumbar region: Secondary | ICD-10-CM | POA: Insufficient documentation

## 2020-04-10 ENCOUNTER — Telehealth: Payer: Self-pay

## 2020-04-10 NOTE — Telephone Encounter (Signed)
Appt made to be seen.  Patient aware  

## 2020-04-11 ENCOUNTER — Encounter: Payer: Self-pay | Admitting: Family Medicine

## 2020-04-11 ENCOUNTER — Other Ambulatory Visit: Payer: Self-pay

## 2020-04-11 ENCOUNTER — Ambulatory Visit (INDEPENDENT_AMBULATORY_CARE_PROVIDER_SITE_OTHER): Payer: Medicare Other | Admitting: Family Medicine

## 2020-04-11 VITALS — BP 120/83 | HR 89 | Temp 97.7°F | Resp 20 | Ht 60.0 in | Wt 132.0 lb

## 2020-04-11 DIAGNOSIS — M546 Pain in thoracic spine: Secondary | ICD-10-CM

## 2020-04-11 DIAGNOSIS — M4802 Spinal stenosis, cervical region: Secondary | ICD-10-CM | POA: Diagnosis not present

## 2020-04-11 DIAGNOSIS — E876 Hypokalemia: Secondary | ICD-10-CM

## 2020-04-11 LAB — BMP8+EGFR
BUN/Creatinine Ratio: 14 (ref 9–23)
BUN: 11 mg/dL (ref 6–24)
CO2: 24 mmol/L (ref 20–29)
Calcium: 9.9 mg/dL (ref 8.7–10.2)
Chloride: 101 mmol/L (ref 96–106)
Creatinine, Ser: 0.76 mg/dL (ref 0.57–1.00)
GFR calc Af Amer: 100 mL/min/{1.73_m2} (ref >59)
GFR calc non Af Amer: 87 mL/min/{1.73_m2} (ref >59)
Glucose: 100 mg/dL — ABNORMAL HIGH (ref 65–99)
Potassium: 3.5 mmol/L (ref 3.5–5.2)
Sodium: 142 mmol/L (ref 134–144)

## 2020-04-11 MED ORDER — BETAMETHASONE SOD PHOS & ACET 6 (3-3) MG/ML IJ SUSP
6.0000 mg | Freq: Once | INTRAMUSCULAR | Status: AC
  Administered 2020-04-11: 6 mg via INTRAMUSCULAR

## 2020-04-11 NOTE — Progress Notes (Signed)
Chief Complaint  Patient presents with  . Back Pain    HPI  Patient presents today for back pain between the shoulder blades.  It is moderately severe in intensity.  She has a history of spinal stenosis in the neck.  However the pain is a bit lower, more thoracic this time.  Onset has been over the last several days.  PMH: Smoking status noted ROS: Per HPI  Objective: BP 120/83   Pulse 89   Temp 97.7 F (36.5 C) (Temporal)   Resp 20   Ht 5' (1.524 m)   Wt 132 lb (59.9 kg)   LMP 07/17/2012   SpO2 96%   BMI 25.78 kg/m  Gen: NAD, alert, cooperative with exam HEENT: NCAT, EOMI, PERRL CV: RRR, good S1/S2, no murmur Resp: CTABL, no wheezes, non-labored I how did it go I thought it was says when I came through I am Ext: No edema, warm Neuro: Alert and oriented, No gross deficits  Assessment and plan:  1. Spinal stenosis of cervical region   2. Acute midline thoracic back pain   3. Hypokalemia     Meds ordered this encounter  Medications  . betamethasone acetate-betamethasone sodium phosphate (CELESTONE) injection 6 mg    Orders Placed This Encounter  Procedures  . BMP8+EGFR    Order Specific Question:   Has the patient fasted?    Answer:   No    Follow up as needed.  Claretta Fraise, MD

## 2020-04-16 NOTE — Progress Notes (Signed)
Hello [name],  Your lab result is normal and/or stable.Some minor variations that are not significant are commonly marked abnormal, but do not represent any medical problem for you.  Best regards, Mechele Claude, M.D.

## 2020-04-19 ENCOUNTER — Other Ambulatory Visit: Payer: Self-pay

## 2020-04-19 ENCOUNTER — Other Ambulatory Visit: Payer: Self-pay | Admitting: Nurse Practitioner

## 2020-04-19 ENCOUNTER — Telehealth: Payer: Self-pay

## 2020-04-19 DIAGNOSIS — F5101 Primary insomnia: Secondary | ICD-10-CM

## 2020-04-19 MED ORDER — ZOLPIDEM TARTRATE 10 MG PO TABS: 10.0000 mg | ORAL_TABLET | Freq: Every evening | ORAL | 0 refills | Status: DC | PRN

## 2020-04-19 NOTE — Telephone Encounter (Signed)
  Prescription Request  04/19/2020  What is the name of the medication or equipment? Ambien  Have you contacted your pharmacy to request a refill? (if applicable) Yes  Which pharmacy would you like this sent to? [facility], [place]  Pt says she ran out of her medicine today and needs refills. Pt had last check up with MMM on 02/16/20.   Pt also wants to know why she has to call us every month to request that more refills be sent in to pharmacy every month?  (Per OV notes, pt is supposed to be seen every 6 months for chronic follow up for this medicine.)   Patient notified that their request is being sent to the clinical staff for review and that they should receive a response within 2 business days.

## 2020-04-19 NOTE — Telephone Encounter (Signed)
LOV 02/16/20 RTC 6 mos Last Rf 03/20/20 next Ov not sched

## 2020-04-20 NOTE — Telephone Encounter (Signed)
Was not refused [name]- was sent in yesterday

## 2020-05-18 ENCOUNTER — Other Ambulatory Visit: Payer: Self-pay

## 2020-05-18 ENCOUNTER — Encounter: Payer: Self-pay | Admitting: Nurse Practitioner

## 2020-05-18 ENCOUNTER — Ambulatory Visit (INDEPENDENT_AMBULATORY_CARE_PROVIDER_SITE_OTHER): Payer: Medicare Other | Admitting: Nurse Practitioner

## 2020-05-18 VITALS — BP 132/86 | HR 87 | Temp 98.1°F | Resp 20 | Ht 60.0 in | Wt 134.0 lb

## 2020-05-18 DIAGNOSIS — G43109 Migraine with aura, not intractable, without status migrainosus: Secondary | ICD-10-CM

## 2020-05-18 DIAGNOSIS — E785 Hyperlipidemia, unspecified: Secondary | ICD-10-CM | POA: Diagnosis not present

## 2020-05-18 DIAGNOSIS — I10 Essential (primary) hypertension: Secondary | ICD-10-CM

## 2020-05-18 DIAGNOSIS — F5101 Primary insomnia: Secondary | ICD-10-CM

## 2020-05-18 DIAGNOSIS — M4802 Spinal stenosis, cervical region: Secondary | ICD-10-CM

## 2020-05-18 DIAGNOSIS — E559 Vitamin D deficiency, unspecified: Secondary | ICD-10-CM | POA: Diagnosis not present

## 2020-05-18 MED ORDER — TRAMADOL HCL 50 MG PO TABS: 50.0000 mg | ORAL_TABLET | Freq: Three times a day (TID) | ORAL | 1 refills | Status: AC | PRN

## 2020-05-18 MED ORDER — ZOLPIDEM TARTRATE 10 MG PO TABS: 10.0000 mg | ORAL_TABLET | Freq: Every evening | ORAL | 5 refills | Status: DC | PRN

## 2020-05-18 MED ORDER — ATORVASTATIN CALCIUM 40 MG PO TABS: 40.0000 mg | ORAL_TABLET | Freq: Every day | ORAL | 1 refills | Status: DC

## 2020-05-18 MED ORDER — FENOFIBRATE 145 MG PO TABS: 145.0000 mg | ORAL_TABLET | Freq: Every day | ORAL | 1 refills | Status: DC

## 2020-05-18 MED ORDER — HYDROCHLOROTHIAZIDE 25 MG PO TABS: 25.0000 mg | ORAL_TABLET | Freq: Every day | ORAL | 1 refills | Status: DC

## 2020-05-18 NOTE — Patient Instructions (Signed)

## 2020-05-18 NOTE — Progress Notes (Signed)
Subjective:    Patient ID: [name], female    DOB: Dec 05, 1960, 59 y.o.   MRN: [id number]   Chief Complaint: medical management of chronic issues     HPI:  1. Essential hypertension, benign No c/o chest pain or SOB. Does not check blood pressure at home. BP Readings from Last 3 Encounters:  04/11/20 120/83  02/16/20 (!) 138/93  11/25/19 126/81     2. Hyperlipidemia with target LDL less than 100 Does try to watch diet and stays very active. She started on lipitor and fenofibrate at last visit. Lab Results  Component Value Date   CHOL 224 (H) 08/13/2019   HDL 31 (L) 08/13/2019   LDLCALC 151 (H) 08/13/2019   TRIG 229 (H) 08/13/2019   CHOLHDL 7.2 (H) 08/13/2019     3. Migraine with aura and without status migrainosus, not intractable Has not had anymore in several months  4. Vitamin D deficiency Takes daily vitamin d supplement  5. Spinal stenosis of cervical region She was suppose to have surgery. She had to cancel it because she has to pay over $1000 prior to surgery, so she is making payments. She is scheduled for surgery on July 24, 2020. She has been having lots of pain while waiting for her surgery. Ortho is not giving her any pain medication. motirn an dtylenol are not helping  6. insomnia Takes ambien nightly and sleeps well with it.    Outpatient Encounter Medications as of 05/18/2020  Medication Sig   aspirin 81 MG chewable tablet Chew 81 mg by mouth daily.   atorvastatin (LIPITOR) 40 MG tablet Take 1 tablet (40 mg total) by mouth daily.   fenofibrate (TRICOR) 145 MG tablet Take 1 tablet (145 mg total) by mouth daily.   hydrochlorothiazide (HYDRODIURIL) 25 MG tablet Take 1 tablet (25 mg total) by mouth daily.   ibuprofen (ADVIL) 200 MG tablet Take 800 mg by mouth every 8 (eight) hours as needed.   zolpidem (AMBIEN) 10 MG tablet Take 1 tablet (10 mg total) by mouth at bedtime as needed. for sleep     Past Surgical History:  Procedure  Laterality Date   CHOLECYSTECTOMY     TUBAL LIGATION      Family History  Problem Relation Age of Onset   Diabetes Mother    Heart disease Father    Diabetes Father    Healthy Sister    Healthy Brother    Healthy Sister     New complaints: None today  Social history: Lives by herself  Controlled substance contract: 02/2020    Review of Systems  Constitutional: Negative for diaphoresis.  Eyes: Negative for pain.  Respiratory: Negative for shortness of breath.   Cardiovascular: Negative for chest pain, palpitations and leg swelling.  Gastrointestinal: Negative for abdominal pain.  Endocrine: Negative for polydipsia.  Musculoskeletal: Positive for neck pain.  Skin: Negative for rash.  Neurological: Negative for dizziness, weakness and headaches.  Hematological: Does not bruise/bleed easily.  All other systems reviewed and are negative.      Objective:   Physical Exam Vitals and nursing note reviewed.  Constitutional:      General: She is not in acute distress.    Appearance: Normal appearance. She is well-developed.  HENT:     Head: Normocephalic.     Nose: Nose normal.  Eyes:     Pupils: Pupils are equal, round, and reactive to light.  Neck:     Vascular: No carotid bruit or JVD.  Cardiovascular:     Rate and Rhythm: Normal rate and regular rhythm.     Heart sounds: Normal heart sounds.  Pulmonary:     Effort: Pulmonary effort is normal. No respiratory distress.     Breath sounds: Normal breath sounds. No wheezing or rales.  Chest:     Chest wall: No tenderness.  Abdominal:     General: Bowel sounds are normal. There is no distension or abdominal bruit.     Palpations: Abdomen is soft. There is no hepatomegaly, splenomegaly, mass or pulsatile mass.     Tenderness: There is no abdominal tenderness.  Musculoskeletal:        General: Normal range of motion.     Cervical back: Normal range of motion and neck supple.  Lymphadenopathy:     Cervical:  No cervical adenopathy.  Skin:    General: Skin is warm and dry.  Neurological:     Mental Status: She is alert and oriented to person, place, and time.     Deep Tendon Reflexes: Reflexes are normal and symmetric.  Psychiatric:        Behavior: Behavior normal.        Thought Content: Thought content normal.        Judgment: Judgment normal.    BP 132/86 (BP Location: Left Arm, Cuff Size: Normal)    Pulse 87    Temp 98.1 F (36.7 C) (Temporal)    Resp 20    Ht 5' (1.524 m)    Wt 134 lb (60.8 kg)    LMP 07/17/2012    SpO2 94%    BMI 26.17 kg/m          Assessment & Plan:  [name] comes in today with chief complaint of Medical Management of Chronic Issues   Diagnosis and orders addressed:  1. Essential hypertension, benign Low sodium diet - hydrochlorothiazide (HYDRODIURIL) 25 MG tablet; Take 1 tablet (25 mg total) by mouth daily.  Dispense: 90 tablet; Refill: 1  2. Hyperlipidemia with target LDL less than 100 Low fat diet - atorvastatin (LIPITOR) 40 MG tablet; Take 1 tablet (40 mg total) by mouth daily.  Dispense: 90 tablet; Refill: 1 - fenofibrate (TRICOR) 145 MG tablet; Take 1 tablet (145 mg total) by mouth daily.  Dispense: 90 tablet; Refill: 1  3. Migraine with aura and without status migrainosus, not intractable  4. Vitamin D deficiency Continue vitamin d supplement  5. Spinal stenosis of cervical region Moist heat rest - traMADol (ULTRAM) 50 MG tablet; Take 1 tablet (50 mg total) by mouth every 8 (eight) hours as needed.  Dispense: 60 tablet; Refill: 1  6. Primary insomnia Bedtime routine - zolpidem (AMBIEN) 10 MG tablet; Take 1 tablet (10 mg total) by mouth at bedtime as needed. for sleep  Dispense: 30 tablet; Refill: 5   Labs pending Health Maintenance reviewed Diet and exercise encouraged  Follow up plan: 6 months   Mary-Margaret Daphine Deutscher, FNP

## 2020-08-08 ENCOUNTER — Other Ambulatory Visit: Payer: Self-pay

## 2020-08-08 ENCOUNTER — Encounter: Payer: Self-pay | Admitting: Physical Therapy

## 2020-08-08 ENCOUNTER — Ambulatory Visit: Payer: Medicare Other | Attending: Orthopedic Surgery | Admitting: Physical Therapy

## 2020-08-08 DIAGNOSIS — R600 Localized edema: Secondary | ICD-10-CM | POA: Insufficient documentation

## 2020-08-08 DIAGNOSIS — M25611 Stiffness of right shoulder, not elsewhere classified: Secondary | ICD-10-CM | POA: Insufficient documentation

## 2020-08-08 DIAGNOSIS — M25511 Pain in right shoulder: Secondary | ICD-10-CM | POA: Diagnosis not present

## 2020-08-08 DIAGNOSIS — M6281 Muscle weakness (generalized): Secondary | ICD-10-CM | POA: Insufficient documentation

## 2020-08-08 NOTE — Therapy (Signed)
[facility] [address] [place], [place], [address] Phone: [phone]   Fax:  [phone]  Physical Therapy Evaluation  Patient Details  Name: [name] MRN: [id number] Date of Birth: 1960-07-31 Referring Provider (PT): Margarita Rana, MD   Encounter Date: 08/08/2020   PT End of Session - 08/08/20 1748    Visit Number 1    Number of Visits 18    Date for PT Re-Evaluation 09/26/20    Authorization Type Medicare (CQ modifier and KX modifier at 15th visit) FOTO; Progress note every 10th visit.    PT Start Time 1345    PT Stop Time 1430    PT Time Calculation (min) 45 min    Equipment Utilized During Treatment --   R sling   Activity Tolerance Patient tolerated treatment well    Behavior During Therapy WFL for tasks assessed/performed           Past Medical History:  Diagnosis Date  . HTN (hypertension)   . Hyperlipidemia   . Migraines     Past Surgical History:  Procedure Laterality Date  . CHOLECYSTECTOMY    . TUBAL LIGATION      There were no vitals filed for this visit.    Subjective Assessment - 08/08/20 1550    Subjective COVID-19 screening performed upon arrival. Patient arrives to physical therapy with right shoulder pain secondary to a right shoulder RTC repair, DCE, SAD, and biceps tenodesis on 07/27/2020. Patient is independent with donning and doffing sling and has slowly become more independent with ADLs. Patient currently has her brother staying with her to assist with home activities but patient is trying to become more independent as she lives alone. Patient reports pain at worst as 5/10 and pain at best as 0/10 with rest. Patient's goals are to decrease pain, improve movement, improve strength, and use arm normally for every day activities.    Pertinent History R shoulder RTC repair, DCE, SAD, and biceps tenodesis 07/27/2020, HTN    Limitations House hold activities    Diagnostic tests MRI    Patient  Stated Goals use arm normally again    Currently in Pain? Yes    Pain Score 3     Pain Location Shoulder    Pain Orientation Right    Pain Descriptors / Indicators Aching    Pain Type Surgical pain    Pain Onset More than a month ago    Pain Frequency Constant    Aggravating Factors  movement    Pain Relieving Factors resting, ice    Effect of Pain on Daily Activities Pain with accidental movement              [facility] PT Assessment - 08/08/20 0001      Assessment   Medical Diagnosis Right shoulder arthroscopic extensive debridement, distal clavicle excision, subacromial decompression, RTC repair of the subscaularis and supraspinatus, biceps tenodesis    Referring Provider (PT) Margarita Rana, MD    Onset Date/Surgical Date 07/27/20    Hand Dominance Right    Next MD Visit 09/04/2020    Prior Therapy no      Precautions   Precautions Shoulder    Precaution Comments per RTC protocol      Restrictions   Weight Bearing Restrictions Yes    RUE Weight Bearing Non weight bearing      Balance Screen   Has the patient fallen in the past 6 months No    Has the patient had a  decrease in activity level because of a fear of falling?  No    Is the patient reluctant to leave their home because of a fear of falling?  No      Home Environment   Living Environment Private residence    Living Arrangements Alone   currently brother staying with her   Available Help at Discharge Family      Prior Function   Level of Independence Needs assistance with homemaking;Independent with basic ADLs;Needs assistance with transfers      Observation/Other Assessments   Skin Integrity incisions healing well with minor scabbing at 3 port incisions    Focus on Therapeutic Outcomes (FOTO)  96% limitation      Observation/Other Assessments-Edema    Edema --   notable edema upon observation of right shoulder     Posture/Postural Control   Posture/Postural Control Postural limitations    Postural  Limitations Rounded Shoulders;Forward head      ROM / Strength   AROM / PROM / Strength PROM      PROM   Overall PROM  Deficits;Due to pain;Due to precautions    PROM Assessment Site Shoulder    Right/Left Shoulder Right    Right Shoulder Flexion 70 Degrees    Right Shoulder ABduction 70 Degrees    Right Shoulder Internal Rotation --   to abdomen   Right Shoulder External Rotation -6 Degrees      Palpation   Palpation comment slight tenderness to palpation to R shoulder      Transfers   Comments Min A for supine to sit                      Objective measurements completed on examination: See above findings.       [facility] Adult PT Treatment/Exercise - 08/08/20 0001      Modalities   Modalities Electrical Stimulation;Vasopneumatic      Programme researcher, broadcasting/film/video Location R shoulder    Electrical Stimulation Action IFC (non motoric)    Electrical Stimulation Parameters 80-150 hz x10 mins    Electrical Stimulation Goals Pain;Edema      Vasopneumatic   Number Minutes Vasopneumatic  10 minutes    Vasopnuematic Location  Shoulder    Vasopneumatic Pressure Low    Vasopneumatic Temperature  34 for pain and edema                  PT Education - 08/08/20 1607    Education Details not using arm for adequate healing    Person(s) Educated Patient    Methods Explanation    Comprehension Verbalized understanding               PT Long Term Goals - 08/08/20 1547      PT LONG TERM GOAL #1   Title Patient will be independent with HEP and its progression.    Time 6    Period Weeks    Status New      PT LONG TERM GOAL #2   Title Patient will demonstrate 140+ degrees of right shoulder flexion AROM to improve overhead activities.    Time 6    Period Weeks    Status New      PT LONG TERM GOAL #3   Title Patient will demonstrate 60+ degrees of right shoulder ER AROM to improve donning/doffing apparel.    Time 6    Period Weeks     Status New  PT LONG TERM GOAL #4   Title Patient will demonstrate 4/5 or greater right shoulder MMT to improve stability during functional tasks.    Time 6    Period Weeks    Status New      PT LONG TERM GOAL #5   Title Patient will report ability to perform ADLs and home activities independently with right shoulder pain less than or equal to 3/10.    Time 6    Period Weeks    Status New                  Plan - 08/08/20 1540    Clinical Impression Statement Patient is a 60 year old right handed female who presents to physical therapy with R shoulder pain, decreased R shoulder ROM, and abnormal posture secondary to a R RTC repair, distal clavicle resection, subacromial decompression and biceps tenodesis on 07/27/2020. Patient's incisions are healing well with slight scabbing. Notable edema to R shoulder upon observation. Patient required min A for supine to sit. Patient and PT discussed plan of care, protocol, and precautions for right shoulder to which patient reported understanding. Patient would benefit from skilled physical therapy to address deficits and address patient's goals.    Personal Factors and Comorbidities Comorbidity 2    Comorbidities R shoulder RTC repair DCE, subacromial decompression, and biceps tenodesis 07/27/2020; HTN    Examination-Activity Limitations Bathing;Locomotion Level;Transfers;Dressing;Hygiene/Grooming;Toileting    Examination-Participation Restrictions Cleaning;Meal Prep    Stability/Clinical Decision Making Stable/Uncomplicated    Clinical Decision Making Low    Rehab Potential Good    PT Frequency 3x / week    PT Duration 6 weeks    PT Treatment/Interventions ADLs/Self Care Home Management;Cryotherapy;Electrical Stimulation;Iontophoresis 4mg /ml Dexamethasone;Moist Heat;Ultrasound;Therapeutic activities;Therapeutic exercise;Manual techniques;Passive range of motion;Scar mobilization;Patient/family education;Vasopneumatic Device;Taping    PT  Next Visit Plan PROM to right shoulder modalities PRN for pain relief    Consulted and Agree with Plan of Care Patient           Patient will benefit from skilled therapeutic intervention in order to improve the following deficits and impairments:  Decreased activity tolerance,Decreased strength,Increased edema,Postural dysfunction,Pain,Impaired UE functional use,Decreased range of motion  Visit Diagnosis: Acute pain of right shoulder - Plan: PT plan of care cert/re-cert  Stiffness of right shoulder, not elsewhere classified - Plan: PT plan of care cert/re-cert  Muscle weakness (generalized) - Plan: PT plan of care cert/re-cert  Localized edema - Plan: PT plan of care cert/re-cert     Problem List Patient Active Problem List   Diagnosis Date Noted  . Spinal stenosis of cervical region 04/11/2020  . Vitamin D deficiency 12/12/2014  . Migraines 10/15/2012  . Essential hypertension, benign 10/15/2012  . Hyperlipidemia with target LDL less than 100 10/15/2012    12/15/2012, PT, DPT 08/08/2020, 5:56 PM  [facility] [address] [place], [place], [place] Phone: [phone]   Fax:  [phone]  Name: [name] MRN: [name] Date of Birth: [birth year]

## 2020-08-10 ENCOUNTER — Ambulatory Visit: Payer: Medicare Other | Admitting: Physical Therapy

## 2020-08-10 ENCOUNTER — Encounter: Payer: Self-pay | Admitting: Physical Therapy

## 2020-08-10 ENCOUNTER — Other Ambulatory Visit: Payer: Self-pay

## 2020-08-10 DIAGNOSIS — M25511 Pain in right shoulder: Secondary | ICD-10-CM | POA: Diagnosis not present

## 2020-08-10 DIAGNOSIS — M6281 Muscle weakness (generalized): Secondary | ICD-10-CM

## 2020-08-10 DIAGNOSIS — R600 Localized edema: Secondary | ICD-10-CM

## 2020-08-10 DIAGNOSIS — M25611 Stiffness of right shoulder, not elsewhere classified: Secondary | ICD-10-CM

## 2020-08-10 NOTE — Therapy (Signed)
[facility] [address] [place], [place], [address] Phone: [phone]   Fax:  [phone]  Physical Therapy Treatment  Patient Details  Name: [name] MRN: [id number] Date of Birth: 1960-12-10 Referring Provider (PT): Margarita Rana, MD   Encounter Date: 08/10/2020   PT End of Session - 08/10/20 1427    Visit Number 2    Number of Visits 18    Date for PT Re-Evaluation 09/26/20    Authorization Type Medicare (CQ modifier and KX modifier at 15th visit) FOTO; Progress note every 10th visit.    PT Start Time 1345    PT Stop Time 1431    PT Time Calculation (min) 46 min    Activity Tolerance Patient tolerated treatment well    Behavior During Therapy WFL for tasks assessed/performed           Past Medical History:  Diagnosis Date  . HTN (hypertension)   . Hyperlipidemia   . Migraines     Past Surgical History:  Procedure Laterality Date  . CHOLECYSTECTOMY    . TUBAL LIGATION      There were no vitals filed for this visit.   Subjective Assessment - 08/10/20 1426    Subjective COVID-19 screening performed upon arrival.  Patient reports some increase of pain since IE with reports of numbness and tingling in R 4th and 5th finger    Pertinent History R shoulder RTC repair, DCE, SAD, and biceps tenodesis 07/27/2020, HTN    Limitations House hold activities    Diagnostic tests MRI    Patient Stated Goals use arm normally again    Currently in Pain? Yes    Pain Score --   did not provide number on pain scale             [facility] PT Assessment - 08/10/20 0001      Assessment   Medical Diagnosis Right shoulder arthroscopic extensive debridement, distal clavicle excision, subacromial decompression, RTC repair of the subscaularis and supraspinatus, biceps tenodesis    Referring Provider (PT) Margarita Rana, MD    Onset Date/Surgical Date 07/27/20    Hand Dominance Right    Next MD Visit 09/04/2020    Prior Therapy no       Precautions   Precautions Shoulder    Precaution Comments per RTC protocol                         [facility] Adult PT Treatment/Exercise - 08/10/20 0001      Modalities   Modalities Electrical Stimulation;Vasopneumatic      Electrical Stimulation   Electrical Stimulation Location R shoulder    Electrical Stimulation Action IFC    Electrical Stimulation Parameters 80-150 hz x10 mins    Electrical Stimulation Goals Pain;Edema      Vasopneumatic   Number Minutes Vasopneumatic  10 minutes    Vasopnuematic Location  Shoulder    Vasopneumatic Pressure Low    Vasopneumatic Temperature  34 for pain and edema      Manual Therapy   Manual Therapy Passive ROM    Passive ROM PROM into flexion, abd and ER within protocol limitaions; frequent oscillations to decrease guarding and pain                  PT Education - 08/10/20 1440    Education Details putty, scapular protraction, retraction and elevation in sling    Person(s) Educated Patient    Methods Explanation;Demonstration  Comprehension Verbalized understanding;Returned demonstration               PT Long Term Goals - 08/08/20 1547      PT LONG TERM GOAL #1   Title Patient will be independent with HEP and its progression.    Time 6    Period Weeks    Status New      PT LONG TERM GOAL #2   Title Patient will demonstrate 140+ degrees of right shoulder flexion AROM to improve overhead activities.    Time 6    Period Weeks    Status New      PT LONG TERM GOAL #3   Title Patient will demonstrate 60+ degrees of right shoulder ER AROM to improve donning/doffing apparel.    Time 6    Period Weeks    Status New      PT LONG TERM GOAL #4   Title Patient will demonstrate 4/5 or greater right shoulder MMT to improve stability during functional tasks.    Time 6    Period Weeks    Status New      PT LONG TERM GOAL #5   Title Patient will report ability to perform ADLs and home activities  independently with right shoulder pain less than or equal to 3/10.    Time 6    Period Weeks    Status New                 Plan - 08/10/20 1427    Clinical Impression Statement Patient responded well to therapy session though with reports of slight increase of pain at end ranges. PROM provided within ROM limitation per protocol. Intermittent oscillations provided to decrease pain. Patient provided putty and shoulder protraction, retraction and elevation as HEP to which she reported understanding. No adverse affects upon removal of modalities.    Personal Factors and Comorbidities Comorbidity 2    Comorbidities R shoulder RTC repair DCE, subacromial decompression, and biceps tenodesis 07/27/2020; HTN    Examination-Activity Limitations Bathing;Locomotion Level;Transfers;Dressing;Hygiene/Grooming;Toileting    Examination-Participation Restrictions Cleaning;Meal Prep    Stability/Clinical Decision Making Stable/Uncomplicated    Clinical Decision Making Low    Rehab Potential Good    PT Frequency 3x / week    PT Duration 6 weeks    PT Treatment/Interventions ADLs/Self Care Home Management;Cryotherapy;Electrical Stimulation;Iontophoresis 4mg /ml Dexamethasone;Moist Heat;Ultrasound;Therapeutic activities;Therapeutic exercise;Manual techniques;Passive range of motion;Scar mobilization;Patient/family education;Vasopneumatic Device;Taping    PT Next Visit Plan PROM to right shoulder modalities PRN for pain relief    PT Home Exercise Plan putty squeeze, scapular retraction protraction and elevation    Consulted and Agree with Plan of Care Patient           Patient will benefit from skilled therapeutic intervention in order to improve the following deficits and impairments:  Decreased activity tolerance,Decreased strength,Increased edema,Postural dysfunction,Pain,Impaired UE functional use,Decreased range of motion  Visit Diagnosis: Acute pain of right shoulder  Stiffness of right shoulder,  not elsewhere classified  Muscle weakness (generalized)  Localized edema     Problem List Patient Active Problem List   Diagnosis Date Noted  . Spinal stenosis of cervical region 04/11/2020  . Vitamin D deficiency 12/12/2014  . Migraines 10/15/2012  . Essential hypertension, benign 10/15/2012  . Hyperlipidemia with target LDL less than 100 10/15/2012    12/15/2012, PT, DPT 08/10/2020, 2:41 PM  [facility] [address] [place], [place], [place] Phone: [phone]   Fax:  [phone]  Name: Tyler  JAYLEENA STILLE MRN: [id number] Date of Birth: 12-08-1960

## 2020-08-14 ENCOUNTER — Other Ambulatory Visit: Payer: Self-pay

## 2020-08-14 ENCOUNTER — Encounter: Payer: Self-pay | Admitting: Physical Therapy

## 2020-08-14 ENCOUNTER — Ambulatory Visit: Payer: Medicare Other | Admitting: Physical Therapy

## 2020-08-14 DIAGNOSIS — R600 Localized edema: Secondary | ICD-10-CM

## 2020-08-14 DIAGNOSIS — M6281 Muscle weakness (generalized): Secondary | ICD-10-CM

## 2020-08-14 DIAGNOSIS — M25611 Stiffness of right shoulder, not elsewhere classified: Secondary | ICD-10-CM

## 2020-08-14 DIAGNOSIS — M25511 Pain in right shoulder: Secondary | ICD-10-CM | POA: Diagnosis not present

## 2020-08-14 NOTE — Therapy (Signed)
[facility] [address] [place], [place], [address] Phone: [phone]   Fax:  [phone]  Physical Therapy Treatment  Patient Details  Name: [name] MRN: [id number] Date of Birth: 10/24/1960 Referring Provider (PT): Margarita Rana, MD   Encounter Date: 08/14/2020   PT End of Session - 08/14/20 1113    Visit Number 3    Number of Visits 18    Date for PT Re-Evaluation 09/26/20    Authorization Type Medicare (CQ modifier and KX modifier at 15th visit) FOTO; Progress note every 10th visit.    PT Start Time 1022    PT Stop Time 1112    PT Time Calculation (min) 50 min    Activity Tolerance Patient tolerated treatment well    Behavior During Therapy WFL for tasks assessed/performed           Past Medical History:  Diagnosis Date  . HTN (hypertension)   . Hyperlipidemia   . Migraines     Past Surgical History:  Procedure Laterality Date  . CHOLECYSTECTOMY    . TUBAL LIGATION      There were no vitals filed for this visit.   Subjective Assessment - 08/14/20 1025    Subjective COVID-19 screening performed upon arrival.  Patient arrived with 4/10 pain and some stiffness. Stated she did a lot with home activities over the weekend.    Pertinent History R shoulder RTC repair, DCE, SAD, and biceps tenodesis 07/27/2020, HTN    Limitations House hold activities    Diagnostic tests MRI    Patient Stated Goals use arm normally again    Currently in Pain? Yes    Pain Score 4     Pain Location Shoulder    Pain Orientation Right    Pain Descriptors / Indicators Aching    Pain Type Surgical pain    Pain Onset More than a month ago    Pain Frequency Constant              [facility] PT Assessment - 08/14/20 0001      Assessment   Medical Diagnosis Right shoulder arthroscopic extensive debridement, distal clavicle excision, subacromial decompression, RTC repair of the subscaularis and supraspinatus, biceps tenodesis     Referring Provider (PT) Margarita Rana, MD    Onset Date/Surgical Date 07/27/20    Hand Dominance Right    Next MD Visit 09/04/2020    Prior Therapy no      Precautions   Precautions Shoulder    Precaution Comments per RTC protocol                         [facility] Adult PT Treatment/Exercise - 08/14/20 0001      Modalities   Modalities Electrical Stimulation;Vasopneumatic      Electrical Stimulation   Electrical Stimulation Location R shoulder    Electrical Stimulation Action IFC    Electrical Stimulation Parameters 80-150 hz x10 mins    Electrical Stimulation Goals Pain;Edema      Vasopneumatic   Number Minutes Vasopneumatic  10 minutes    Vasopnuematic Location  Shoulder    Vasopneumatic Pressure Low    Vasopneumatic Temperature  34 for pain and edema      Manual Therapy   Manual Therapy Passive ROM    Passive ROM PROM into flexion, abd and ER within protocol limitations; frequent oscillations to decrease guarding and pain  PT Long Term Goals - 08/08/20 1547      PT LONG TERM GOAL #1   Title Patient will be independent with HEP and its progression.    Time 6    Period Weeks    Status New      PT LONG TERM GOAL #2   Title Patient will demonstrate 140+ degrees of right shoulder flexion AROM to improve overhead activities.    Time 6    Period Weeks    Status New      PT LONG TERM GOAL #3   Title Patient will demonstrate 60+ degrees of right shoulder ER AROM to improve donning/doffing apparel.    Time 6    Period Weeks    Status New      PT LONG TERM GOAL #4   Title Patient will demonstrate 4/5 or greater right shoulder MMT to improve stability during functional tasks.    Time 6    Period Weeks    Status New      PT LONG TERM GOAL #5   Title Patient will report ability to perform ADLs and home activities independently with right shoulder pain less than or equal to 3/10.    Time 6    Period Weeks    Status New                  Plan - 08/14/20 1107    Clinical Impression Statement Patient arrives to physical therapy doing fairly well but with ongoing pain and soreness in R shoulder. PROM performed into flexion, ER, IR, and abd with reports of pain at end ranges. Patient provided with intermittent oscillations and gentle STW/M at shoulder to decrease pain and muscle guarding. Smooth arcs of motion with less guarding as session continued. Patient and PT discussed protocol and progression to which patient reported understanding. No adverse affects upon removal of modalities.    Personal Factors and Comorbidities Comorbidity 2    Comorbidities R shoulder RTC repair DCE, subacromial decompression, and biceps tenodesis 07/27/2020; HTN    Examination-Activity Limitations Bathing;Locomotion Level;Transfers;Dressing;Hygiene/Grooming;Toileting    Examination-Participation Restrictions Cleaning;Meal Prep    Stability/Clinical Decision Making Stable/Uncomplicated    Clinical Decision Making Low    Rehab Potential Good    PT Frequency 3x / week    PT Duration 6 weeks    PT Treatment/Interventions ADLs/Self Care Home Management;Cryotherapy;Electrical Stimulation;Iontophoresis 4mg /ml Dexamethasone;Moist Heat;Ultrasound;Therapeutic activities;Therapeutic exercise;Manual techniques;Passive range of motion;Scar mobilization;Patient/family education;Vasopneumatic Device;Taping    PT Next Visit Plan PROM to right shoulder modalities PRN for pain relief    PT Home Exercise Plan putty squeeze, scapular retraction protraction and elevation    Consulted and Agree with Plan of Care Patient           Patient will benefit from skilled therapeutic intervention in order to improve the following deficits and impairments:  Decreased activity tolerance,Decreased strength,Increased edema,Postural dysfunction,Pain,Impaired UE functional use,Decreased range of motion  Visit Diagnosis: Acute pain of right shoulder  Stiffness  of right shoulder, not elsewhere classified  Muscle weakness (generalized)  Localized edema     Problem List Patient Active Problem List   Diagnosis Date Noted  . Spinal stenosis of cervical region 04/11/2020  . Vitamin D deficiency 12/12/2014  . Migraines 10/15/2012  . Essential hypertension, benign 10/15/2012  . Hyperlipidemia with target LDL less than 100 10/15/2012    12/15/2012, PT, DPT 08/14/2020, 11:14 AM  [facility] [address] [place], [place], [place] Phone: [phone]  Fax:  [phone]  Name: [name] MRN: [id number] Date of Birth: 1961-01-12

## 2020-08-16 ENCOUNTER — Ambulatory Visit: Payer: Medicare Other | Admitting: Physical Therapy

## 2020-08-16 ENCOUNTER — Other Ambulatory Visit: Payer: Self-pay

## 2020-08-16 DIAGNOSIS — M6281 Muscle weakness (generalized): Secondary | ICD-10-CM

## 2020-08-16 DIAGNOSIS — M25511 Pain in right shoulder: Secondary | ICD-10-CM | POA: Diagnosis not present

## 2020-08-16 DIAGNOSIS — M25611 Stiffness of right shoulder, not elsewhere classified: Secondary | ICD-10-CM

## 2020-08-16 DIAGNOSIS — R600 Localized edema: Secondary | ICD-10-CM

## 2020-08-16 NOTE — Therapy (Signed)
[facility] [address] [place], [place], [address] Phone: [phone]   Fax:  [phone]  Physical Therapy Treatment  Patient Details  Name: [name] MRN: [id number] Date of Birth: [birth year] Referring Provider (PT): Margarita Rana, MD   Encounter Date: 08/16/2020   PT End of Session - 08/16/20 1306    Visit Number 4    Number of Visits 18    Date for PT Re-Evaluation 09/26/20    Authorization Type Medicare (CQ modifier and KX modifier at 15th visit) FOTO; Progress note every 10th visit.    PT Start Time 1030    PT Stop Time 1117    PT Time Calculation (min) 47 min    Equipment Utilized During Treatment --   R sling   Activity Tolerance Patient tolerated treatment well    Behavior During Therapy WFL for tasks assessed/performed           Past Medical History:  Diagnosis Date  . HTN (hypertension)   . Hyperlipidemia   . Migraines     Past Surgical History:  Procedure Laterality Date  . CHOLECYSTECTOMY    . TUBAL LIGATION      There were no vitals filed for this visit.   Subjective Assessment - 08/16/20 1306    Subjective COVID-19 screening performed upon arrival.  Patient reports a burning shooting pain starting at [facility] joint that works toward shoulder blade. States she attempted putting a bra on with pain    Pertinent History R shoulder RTC repair, DCE, SAD, and biceps tenodesis 07/27/2020, HTN    Limitations House hold activities    Diagnostic tests MRI    Patient Stated Goals use arm normally again    Currently in Pain? Yes    Pain Score 4     Pain Location Shoulder    Pain Orientation Right    Pain Descriptors / Indicators Aching;Sore    Pain Type Surgical pain    Pain Onset More than a month ago    Pain Frequency Constant              [facility] PT Assessment - 08/16/20 0001      Assessment   Medical Diagnosis Right shoulder arthroscopic extensive debridement, distal clavicle excision, subacromial  decompression, RTC repair of the subscaularis and supraspinatus, biceps tenodesis    Referring Provider (PT) Margarita Rana, MD    Onset Date/Surgical Date 07/27/20    Hand Dominance Right    Next MD Visit 09/04/2020    Prior Therapy no      Precautions   Precautions Shoulder    Precaution Comments per RTC protocol      PROM   Right Shoulder Flexion 133 Degrees                         [facility] Adult PT Treatment/Exercise - 08/16/20 0001      Modalities   Modalities Electrical Stimulation;Vasopneumatic      Electrical Stimulation   Electrical Stimulation Location R shoulder    Electrical Stimulation Action IFC    Electrical Stimulation Parameters 80-150 hz x10 mins    Electrical Stimulation Goals Pain;Edema      Vasopneumatic   Number Minutes Vasopneumatic  10 minutes    Vasopnuematic Location  Shoulder    Vasopneumatic Pressure Low    Vasopneumatic Temperature  34 for pain and edema      Manual Therapy   Manual Therapy Passive ROM;Joint mobilization    Joint  Mobilization PA amd inferior joint mobs to improve ROM and decrease pain    Passive ROM PROM into flexion, abd and ER within protocol limitations; frequent oscillations to decrease guarding and pain                       PT Long Term Goals - 08/08/20 1547      PT LONG TERM GOAL #1   Title Patient will be independent with HEP and its progression.    Time 6    Period Weeks    Status New      PT LONG TERM GOAL #2   Title Patient will demonstrate 140+ degrees of right shoulder flexion AROM to improve overhead activities.    Time 6    Period Weeks    Status New      PT LONG TERM GOAL #3   Title Patient will demonstrate 60+ degrees of right shoulder ER AROM to improve donning/doffing apparel.    Time 6    Period Weeks    Status New      PT LONG TERM GOAL #4   Title Patient will demonstrate 4/5 or greater right shoulder MMT to improve stability during functional tasks.    Time 6     Period Weeks    Status New      PT LONG TERM GOAL #5   Title Patient will report ability to perform ADLs and home activities independently with right shoulder pain less than or equal to 3/10.    Time 6    Period Weeks    Status New                 Plan - 08/16/20 1308    Clinical Impression Statement Patient responded well to therapy with improvement in ROM despite pain. Patient with smooth arcs of motion with ROM. Inferior and PA joint mobilization provided with response. R shoulder flexion PROM measured at 133 today. Patient instructed not to attempt putting on bra at this time secondary to AROM required to perform activity. Patient reported understanding. No adverse affects upon removal of modalities.    Personal Factors and Comorbidities Comorbidity 2    Comorbidities R shoulder RTC repair DCE, subacromial decompression, and biceps tenodesis 07/27/2020; HTN    Examination-Activity Limitations Bathing;Locomotion Level;Transfers;Dressing;Hygiene/Grooming;Toileting    Examination-Participation Restrictions Cleaning;Meal Prep    Stability/Clinical Decision Making Stable/Uncomplicated    Clinical Decision Making Low    Rehab Potential Good    PT Frequency 3x / week    PT Duration 6 weeks    PT Treatment/Interventions ADLs/Self Care Home Management;Cryotherapy;Electrical Stimulation;Iontophoresis 4mg /ml Dexamethasone;Moist Heat;Ultrasound;Therapeutic activities;Therapeutic exercise;Manual techniques;Passive range of motion;Scar mobilization;Patient/family education;Vasopneumatic Device;Taping    PT Next Visit Plan PROM to right shoulder modalities PRN for pain relief    PT Home Exercise Plan putty squeeze, scapular retraction protraction and elevation    Consulted and Agree with Plan of Care Patient           Patient will benefit from skilled therapeutic intervention in order to improve the following deficits and impairments:  Decreased activity tolerance,Decreased  strength,Increased edema,Postural dysfunction,Pain,Impaired UE functional use,Decreased range of motion  Visit Diagnosis: Acute pain of right shoulder  Stiffness of right shoulder, not elsewhere classified  Muscle weakness (generalized)  Localized edema     Problem List Patient Active Problem List   Diagnosis Date Noted  . Spinal stenosis of cervical region 04/11/2020  . Vitamin D deficiency 12/12/2014  . Migraines 10/15/2012  .  Essential hypertension, benign 10/15/2012  . Hyperlipidemia with target LDL less than 100 10/15/2012    Guss Bunde, PT, DPT 08/16/2020, 1:11 PM  [facility] [address] [place], [place], [address] Phone: [phone]   Fax:  [phone]  Name: [name] MRN: [id number] Date of Birth: 25-Apr-1961

## 2020-08-18 ENCOUNTER — Other Ambulatory Visit: Payer: Self-pay

## 2020-08-18 ENCOUNTER — Ambulatory Visit: Payer: Medicare Other | Admitting: Physical Therapy

## 2020-08-18 DIAGNOSIS — M6281 Muscle weakness (generalized): Secondary | ICD-10-CM

## 2020-08-18 DIAGNOSIS — M25511 Pain in right shoulder: Secondary | ICD-10-CM

## 2020-08-18 DIAGNOSIS — R600 Localized edema: Secondary | ICD-10-CM

## 2020-08-18 DIAGNOSIS — M25611 Stiffness of right shoulder, not elsewhere classified: Secondary | ICD-10-CM

## 2020-08-18 NOTE — Therapy (Signed)
[facility] [address] [place], [place], [address] Phone: [phone]   Fax:  [phone]  Physical Therapy Treatment  Patient Details  Name: [name] MRN: [id number] Date of Birth: 1960/08/22 Referring Provider (PT): Margarita Rana, MD   Encounter Date: 08/18/2020   PT End of Session - 08/18/20 1140    Visit Number 5    Number of Visits 18    Date for PT Re-Evaluation 09/26/20    Authorization Type Medicare (CQ modifier and KX modifier at 15th visit) FOTO; Progress note every 10th visit.    PT Start Time 1030    PT Stop Time 1120    PT Time Calculation (min) 50 min    Activity Tolerance Patient tolerated treatment well    Behavior During Therapy WFL for tasks assessed/performed           Past Medical History:  Diagnosis Date  . HTN (hypertension)   . Hyperlipidemia   . Migraines     Past Surgical History:  Procedure Laterality Date  . CHOLECYSTECTOMY    . TUBAL LIGATION      There were no vitals filed for this visit.       [facility] PT Assessment - 08/18/20 0001      Assessment   Medical Diagnosis Right shoulder arthroscopic extensive debridement, distal clavicle excision, subacromial decompression, RTC repair of the subscaularis and supraspinatus, biceps tenodesis    Referring Provider (PT) Margarita Rana, MD    Onset Date/Surgical Date 07/27/20    Hand Dominance Right    Next MD Visit 09/04/2020    Prior Therapy no      Precautions   Precautions Shoulder    Precaution Comments per RTC protocol                         [facility] Adult PT Treatment/Exercise - 08/18/20 0001      Modalities   Modalities Electrical Stimulation;Vasopneumatic      Electrical Stimulation   Electrical Stimulation Location R shoulder    Electrical Stimulation Action IFC    Electrical Stimulation Parameters 80-150 hz x10 mins    Electrical Stimulation Goals Pain;Edema      Vasopneumatic   Number Minutes  Vasopneumatic  10 minutes    Vasopnuematic Location  Shoulder    Vasopneumatic Pressure Low    Vasopneumatic Temperature  34 for pain and edema      Manual Therapy   Manual Therapy Passive ROM;Joint mobilization    Joint Mobilization PA amd inferior joint mobs to improve ROM and decrease pain    Passive ROM PROM into flexion, abd and ER within protocol limitations; frequent oscillations to decrease guarding and pain                       PT Long Term Goals - 08/08/20 1547      PT LONG TERM GOAL #1   Title Patient will be independent with HEP and its progression.    Time 6    Period Weeks    Status New      PT LONG TERM GOAL #2   Title Patient will demonstrate 140+ degrees of right shoulder flexion AROM to improve overhead activities.    Time 6    Period Weeks    Status New      PT LONG TERM GOAL #3   Title Patient will demonstrate 60+ degrees of right shoulder ER AROM to improve donning/doffing apparel.  Time 6    Period Weeks    Status New      PT LONG TERM GOAL #4   Title Patient will demonstrate 4/5 or greater right shoulder MMT to improve stability during functional tasks.    Time 6    Period Weeks    Status New      PT LONG TERM GOAL #5   Title Patient will report ability to perform ADLs and home activities independently with right shoulder pain less than or equal to 3/10.    Time 6    Period Weeks    Status New                 Plan - 08/18/20 1140    Clinical Impression Statement Patient responded well to therapy session but with ongoing tightness at start of session. Grade 1-2 joint mobs provided with excellent response. Pain still at 3/10 at end of PROM but reported feeling "looser". No adverse affects upon completion of modalities.    Personal Factors and Comorbidities Comorbidity 2    Comorbidities R shoulder RTC repair DCE, subacromial decompression, and biceps tenodesis 07/27/2020; HTN    Examination-Activity Limitations  Bathing;Locomotion Level;Transfers;Dressing;Hygiene/Grooming;Toileting    Examination-Participation Restrictions Cleaning;Meal Prep    Stability/Clinical Decision Making Stable/Uncomplicated    Clinical Decision Making Low    Rehab Potential Good    PT Frequency 3x / week    PT Duration 6 weeks    PT Treatment/Interventions ADLs/Self Care Home Management;Cryotherapy;Electrical Stimulation;Iontophoresis 4mg /ml Dexamethasone;Moist Heat;Ultrasound;Therapeutic activities;Therapeutic exercise;Manual techniques;Passive range of motion;Scar mobilization;Patient/family education;Vasopneumatic Device;Taping    PT Next Visit Plan PROM to right shoulder modalities PRN for pain relief    PT Home Exercise Plan putty squeeze, scapular retraction protraction and elevation    Consulted and Agree with Plan of Care Patient           Patient will benefit from skilled therapeutic intervention in order to improve the following deficits and impairments:  Decreased activity tolerance,Decreased strength,Increased edema,Postural dysfunction,Pain,Impaired UE functional use,Decreased range of motion  Visit Diagnosis: Acute pain of right shoulder  Stiffness of right shoulder, not elsewhere classified  Muscle weakness (generalized)  Localized edema     Problem List Patient Active Problem List   Diagnosis Date Noted  . Spinal stenosis of cervical region 04/11/2020  . Vitamin D deficiency 12/12/2014  . Migraines 10/15/2012  . Essential hypertension, benign 10/15/2012  . Hyperlipidemia with target LDL less than 100 10/15/2012    12/15/2012, PT, DPT 08/18/2020, 11:50 AM  [facility] [address] [place], [place], [place] Phone: [phone]   Fax:  [phone]  Name: [name] MRN: [name] Date of Birth: Apr 12, 1961

## 2020-08-21 ENCOUNTER — Other Ambulatory Visit: Payer: Self-pay

## 2020-08-21 ENCOUNTER — Encounter: Payer: Self-pay | Admitting: Physical Therapy

## 2020-08-21 ENCOUNTER — Ambulatory Visit: Payer: Medicare Other | Admitting: Physical Therapy

## 2020-08-21 DIAGNOSIS — M25511 Pain in right shoulder: Secondary | ICD-10-CM

## 2020-08-21 DIAGNOSIS — M6281 Muscle weakness (generalized): Secondary | ICD-10-CM

## 2020-08-21 DIAGNOSIS — M25611 Stiffness of right shoulder, not elsewhere classified: Secondary | ICD-10-CM

## 2020-08-21 DIAGNOSIS — R600 Localized edema: Secondary | ICD-10-CM

## 2020-08-21 NOTE — Therapy (Signed)
[facility] [address] [place], [place], [address] Phone: [phone]   Fax:  [phone]  Physical Therapy Treatment  Patient Details  Name: [name] MRN: [id number] Date of Birth: 02-Mar-1961 Referring Provider (PT): Margarita Rana, MD   Encounter Date: 08/21/2020   PT End of Session - 08/21/20 1213    Visit Number 6    Number of Visits 18    Date for PT Re-Evaluation 09/26/20    Authorization Type Medicare (CQ modifier and KX modifier at 15th visit) FOTO; Progress note every 10th visit.    PT Start Time 1030    PT Stop Time 1118    PT Time Calculation (min) 48 min    Activity Tolerance Patient tolerated treatment well    Behavior During Therapy WFL for tasks assessed/performed           Past Medical History:  Diagnosis Date  . HTN (hypertension)   . Hyperlipidemia   . Migraines     Past Surgical History:  Procedure Laterality Date  . CHOLECYSTECTOMY    . TUBAL LIGATION      There were no vitals filed for this visit.   Subjective Assessment - 08/21/20 1115    Subjective COVID-19 screening performed upon arrival.  Patient reports 2/10 pain today but still having difficulties with sleeping.    Pertinent History R shoulder RTC repair, DCE, SAD, and biceps tenodesis 07/27/2020, HTN    Limitations House hold activities    Diagnostic tests MRI    Currently in Pain? Yes    Pain Location Shoulder    Pain Orientation Right    Pain Descriptors / Indicators Aching;Sore    Pain Type Surgical pain    Pain Onset More than a month ago    Pain Frequency Constant              [facility] PT Assessment - 08/21/20 0001      Assessment   Medical Diagnosis Right shoulder arthroscopic extensive debridement, distal clavicle excision, subacromial decompression, RTC repair of the subscaularis and supraspinatus, biceps tenodesis    Referring Provider (PT) Margarita Rana, MD    Onset Date/Surgical Date 07/27/20    Hand  Dominance Right    Next MD Visit 09/04/2020    Prior Therapy no      Precautions   Precautions Shoulder                         [facility] Adult PT Treatment/Exercise - 08/21/20 0001      Exercises   Exercises Shoulder      Shoulder Exercises: Pulleys   Flexion 3 minutes      Shoulder Exercises: ROM/Strengthening   Ranger seated ranger flexion and CW/CCW x5 mins      Modalities   Modalities Electrical Stimulation;Vasopneumatic      Electrical Stimulation   Electrical Stimulation Location R shoulder    Electrical Stimulation Action IFC    Electrical Stimulation Parameters 80-150 hz x10 mins    Electrical Stimulation Goals Pain;Edema      Vasopneumatic   Number Minutes Vasopneumatic  10 minutes    Vasopnuematic Location  Shoulder    Vasopneumatic Pressure Low    Vasopneumatic Temperature  34 for pain and edema      Manual Therapy   Manual Therapy Passive ROM;Joint mobilization    Joint Mobilization PA amd inferior joint mobs to improve ROM and decrease pain    Passive ROM PROM into flexion, abd  and ER within protocol limitations; frequent oscillations to decrease guarding and pain                  PT Education - 08/21/20 1116    Education Details Pulley system    Methods Explanation    Comprehension Verbalized understanding               PT Long Term Goals - 08/08/20 1547      PT LONG TERM GOAL #1   Title Patient will be independent with HEP and its progression.    Time 6    Period Weeks    Status New      PT LONG TERM GOAL #2   Title Patient will demonstrate 140+ degrees of right shoulder flexion AROM to improve overhead activities.    Time 6    Period Weeks    Status New      PT LONG TERM GOAL #3   Title Patient will demonstrate 60+ degrees of right shoulder ER AROM to improve donning/doffing apparel.    Time 6    Period Weeks    Status New      PT LONG TERM GOAL #4   Title Patient will demonstrate 4/5 or greater right  shoulder MMT to improve stability during functional tasks.    Time 6    Period Weeks    Status New      PT LONG TERM GOAL #5   Title Patient will report ability to perform ADLs and home activities independently with right shoulder pain less than or equal to 3/10.    Time 6    Period Weeks    Status New                 Plan - 08/21/20 1213    Clinical Impression Statement Patient responded well to the progression to [facility] exercises. Patient provided with verbal cuing for form with excellent carryover for remaining time. Patient is making exceelent gains in PROM within limitations of protocol. No adverse affects upon completion of modalities    Personal Factors and Comorbidities Comorbidity 2    Comorbidities R shoulder RTC repair DCE, subacromial decompression, and biceps tenodesis 07/27/2020; HTN    Examination-Activity Limitations Bathing;Locomotion Level;Transfers;Dressing;Hygiene/Grooming;Toileting    Examination-Participation Restrictions Cleaning;Meal Prep    Stability/Clinical Decision Making Stable/Uncomplicated    Clinical Decision Making Low    Rehab Potential Good    PT Frequency 3x / week    PT Duration 6 weeks    PT Treatment/Interventions ADLs/Self Care Home Management;Cryotherapy;Electrical Stimulation;Iontophoresis 4mg /ml Dexamethasone;Moist Heat;Ultrasound;Therapeutic activities;Therapeutic exercise;Manual techniques;Passive range of motion;Scar mobilization;Patient/family education;Vasopneumatic Device;Taping    PT Next Visit Plan cont AAROM; PROM to right shoulder modalities PRN for pain relief    PT Home Exercise Plan Pulley system for home    Consulted and Agree with Plan of Care Patient           Patient will benefit from skilled therapeutic intervention in order to improve the following deficits and impairments:  Decreased activity tolerance,Decreased strength,Increased edema,Postural dysfunction,Pain,Impaired UE functional use,Decreased range of  motion  Visit Diagnosis: Acute pain of right shoulder  Stiffness of right shoulder, not elsewhere classified  Muscle weakness (generalized)  Localized edema     Problem List Patient Active Problem List   Diagnosis Date Noted  . Spinal stenosis of cervical region 04/11/2020  . Vitamin D deficiency 12/12/2014  . Migraines 10/15/2012  . Essential hypertension, benign 10/15/2012  . Hyperlipidemia with target LDL less than 100  10/15/2012    Guss Bunde, PT, DPT 08/21/2020, 12:23 PM  [facility] [address] [place], [place], [address] Phone: [phone]   Fax:  [phone]  Name: DENETTA FEI MRN: [id number] Date of Birth: 1961/04/20

## 2020-08-23 ENCOUNTER — Ambulatory Visit: Payer: Medicare Other | Admitting: *Deleted

## 2020-08-23 ENCOUNTER — Other Ambulatory Visit: Payer: Self-pay

## 2020-08-23 DIAGNOSIS — M25611 Stiffness of right shoulder, not elsewhere classified: Secondary | ICD-10-CM

## 2020-08-23 DIAGNOSIS — M6281 Muscle weakness (generalized): Secondary | ICD-10-CM

## 2020-08-23 DIAGNOSIS — R600 Localized edema: Secondary | ICD-10-CM

## 2020-08-23 DIAGNOSIS — M25511 Pain in right shoulder: Secondary | ICD-10-CM | POA: Diagnosis not present

## 2020-08-23 NOTE — Therapy (Signed)
[facility] [address] [place], [place], [address] Phone: [phone]   Fax:  [phone]  Physical Therapy Treatment  Patient Details  Name: [name] MRN: [id number] Date of Birth: [birth year] Referring Provider (PT): Margarita Rana, MD   Encounter Date: 08/23/2020   PT End of Session - 08/23/20 1041    Visit Number 7    Number of Visits 18    Date for PT Re-Evaluation 09/26/20    Authorization Type Medicare (CQ modifier and KX modifier at 15th visit) FOTO; Progress note every 10th visit.    PT Start Time 1030    PT Stop Time 1118    PT Time Calculation (min) 48 min           Past Medical History:  Diagnosis Date  . HTN (hypertension)   . Hyperlipidemia   . Migraines     Past Surgical History:  Procedure Laterality Date  . CHOLECYSTECTOMY    . TUBAL LIGATION      There were no vitals filed for this visit.   Subjective Assessment - 08/23/20 1036    Subjective COVID-19 screening performed upon arrival.  Patient reports 4/10 today due to leaning on RT arm in the car.    Pertinent History R shoulder RTC repair, DCE, SAD, and biceps tenodesis 07/27/2020, HTN    Limitations House hold activities    Diagnostic tests MRI    Patient Stated Goals use arm normally again    Currently in Pain? Yes    Pain Score 4     Pain Location Shoulder    Pain Orientation Right    Pain Descriptors / Indicators Aching;Sore    Pain Type Surgical pain    Pain Onset More than a month ago                             [facility] Adult PT Treatment/Exercise - 08/23/20 0001      Exercises   Exercises Shoulder      Shoulder Exercises: Pulleys   Flexion 3 minutes      Shoulder Exercises: ROM/Strengthening   Ranger seated ranger flexion and CW/CCW x5 mins      Modalities   Modalities Electrical Stimulation;Vasopneumatic      Electrical Stimulation   Electrical Stimulation Location R shoulder    Electrical  Stimulation Action IFC    Electrical Stimulation Parameters 80-150hz  x 15 mins    Electrical Stimulation Goals Pain;Edema      Vasopneumatic   Number Minutes Vasopneumatic  15 minutes    Vasopnuematic Location  Shoulder    Vasopneumatic Pressure Low    Vasopneumatic Temperature  34 for pain and edema      Manual Therapy   Manual Therapy Passive ROM;Joint mobilization    Passive ROM PROM into flexion, abd and ER within protocol limitations; frequent oscillations to decrease guarding and pain                       PT Long Term Goals - 08/08/20 1547      PT LONG TERM GOAL #1   Title Patient will be independent with HEP and its progression.    Time 6    Period Weeks    Status New      PT LONG TERM GOAL #2   Title Patient will demonstrate 140+ degrees of right shoulder flexion AROM to improve overhead activities.    Time 6  Period Weeks    Status New      PT LONG TERM GOAL #3   Title Patient will demonstrate 60+ degrees of right shoulder ER AROM to improve donning/doffing apparel.    Time 6    Period Weeks    Status New      PT LONG TERM GOAL #4   Title Patient will demonstrate 4/5 or greater right shoulder MMT to improve stability during functional tasks.    Time 6    Period Weeks    Status New      PT LONG TERM GOAL #5   Title Patient will report ability to perform ADLs and home activities independently with right shoulder pain less than or equal to 3/10.    Time 6    Period Weeks    Status New                 Plan - 08/23/20 1041    Clinical Impression Statement Pt arrived today having increased RT shldr pain due to leaning on her shldr this morning and tried to raise her RT arm. Pt was advised not to perform AROM at this time due to the healing process. She did well with Rx AAROM exs as well as PROM to RT shldr. Normal modality response end of session    Personal Factors and Comorbidities Comorbidity 2    Comorbidities R shoulder RTC repair  DCE, subacromial decompression, and biceps tenodesis 07/27/2020; HTN    Examination-Activity Limitations Bathing;Locomotion Level;Transfers;Dressing;Hygiene/Grooming;Toileting    Stability/Clinical Decision Making Stable/Uncomplicated    Rehab Potential Good    PT Frequency 3x / week    PT Duration 6 weeks    PT Treatment/Interventions ADLs/Self Care Home Management;Cryotherapy;Electrical Stimulation;Iontophoresis 4mg /ml Dexamethasone;Moist Heat;Ultrasound;Therapeutic activities;Therapeutic exercise;Manual techniques;Passive range of motion;Scar mobilization;Patient/family education;Vasopneumatic Device;Taping    PT Next Visit Plan cont AAROM; PROM to right shoulder modalities PRN for pain relief    PT Home Exercise Plan Pulley system for home           Patient will benefit from skilled therapeutic intervention in order to improve the following deficits and impairments:  Decreased activity tolerance,Decreased strength,Increased edema,Postural dysfunction,Pain,Impaired UE functional use,Decreased range of motion  Visit Diagnosis: Acute pain of right shoulder  Stiffness of right shoulder, not elsewhere classified  Muscle weakness (generalized)  Localized edema     Problem List Patient Active Problem List   Diagnosis Date Noted  . Spinal stenosis of cervical region 04/11/2020  . Vitamin D deficiency 12/12/2014  . Migraines 10/15/2012  . Essential hypertension, benign 10/15/2012  . Hyperlipidemia with target LDL less than 100 10/15/2012    Jozalynn Noyce,[name], PTA 08/23/2020, 12:02 PM  [facility] [address] [place], [place], [place] Phone: [phone]   Fax:  [phone]  Name: [name] MRN: [name] Date of Birth: [birth year]

## 2020-08-25 ENCOUNTER — Encounter: Payer: Self-pay | Admitting: Physical Therapy

## 2020-08-25 ENCOUNTER — Other Ambulatory Visit: Payer: Self-pay

## 2020-08-25 ENCOUNTER — Ambulatory Visit: Payer: Medicare Other | Admitting: Physical Therapy

## 2020-08-25 DIAGNOSIS — M25511 Pain in right shoulder: Secondary | ICD-10-CM | POA: Diagnosis not present

## 2020-08-25 DIAGNOSIS — M6281 Muscle weakness (generalized): Secondary | ICD-10-CM

## 2020-08-25 DIAGNOSIS — M25611 Stiffness of right shoulder, not elsewhere classified: Secondary | ICD-10-CM

## 2020-08-25 DIAGNOSIS — R600 Localized edema: Secondary | ICD-10-CM

## 2020-08-25 NOTE — Therapy (Signed)
[facility] [address] [place], [place], [address] Phone: [phone]   Fax:  [phone]  Physical Therapy Treatment  Patient Details  Name: [name] MRN: [id number] Date of Birth: Jan 16, 1961 Referring Provider (PT): Margarita Rana, MD   Encounter Date: 08/25/2020   PT End of Session - 08/25/20 1035    Visit Number 8    Number of Visits 18    Date for PT Re-Evaluation 09/26/20    Authorization Type Medicare (CQ modifier and KX modifier at 15th visit) FOTO; Progress note every 10th visit.    PT Start Time 1030    PT Stop Time 1118    PT Time Calculation (min) 48 min    Activity Tolerance Patient tolerated treatment well    Behavior During Therapy WFL for tasks assessed/performed           Past Medical History:  Diagnosis Date  . HTN (hypertension)   . Hyperlipidemia   . Migraines     Past Surgical History:  Procedure Laterality Date  . CHOLECYSTECTOMY    . TUBAL LIGATION      There were no vitals filed for this visit.   Subjective Assessment - 08/25/20 1035    Subjective COVID-19 screening performed upon arrival. Patient arrives with 3/10 in right shoulder.    Pertinent History R shoulder RTC repair, DCE, SAD, and biceps tenodesis 07/27/2020, HTN    Limitations House hold activities    Diagnostic tests MRI    Patient Stated Goals use arm normally again    Currently in Pain? Yes    Pain Score 3     Pain Location Shoulder    Pain Orientation Right    Pain Descriptors / Indicators Aching;Sore    Pain Type Surgical pain    Pain Onset More than a month ago    Pain Frequency Constant              [facility] PT Assessment - 08/25/20 0001      Assessment   Medical Diagnosis Right shoulder arthroscopic extensive debridement, distal clavicle excision, subacromial decompression, RTC repair of the subscaularis and supraspinatus, biceps tenodesis    Referring Provider (PT) Margarita Rana, MD    Onset  Date/Surgical Date 07/27/20    Hand Dominance Right    Next MD Visit 09/04/2020    Prior Therapy no      Precautions   Precautions Shoulder    Precaution Comments per RTC protocol                         [facility] Adult PT Treatment/Exercise - 08/25/20 0001      Exercises   Exercises Shoulder      Shoulder Exercises: Pulleys   Flexion 5 minutes      Shoulder Exercises: ROM/Strengthening   Ranger seated ranger flexion and CW/CCW x5 mins      Modalities   Modalities Vasopneumatic      Electrical Stimulation   Electrical Stimulation Location --    Statistician Action --    Statistician Parameters --    Statistician Goals --      Vasopneumatic   Number Minutes Vasopneumatic  15 minutes    Vasopnuematic Location  Shoulder    Vasopneumatic Pressure Low    Vasopneumatic Temperature  34 for pain and edema      Manual Therapy   Manual Therapy Passive ROM;Joint mobilization    Joint Mobilization PA amd inferior joint mobs  to improve ROM and decrease pain    Passive ROM PROM into flexion, abd and ER within protocol limitations; frequent oscillations to decrease guarding and pain                       PT Long Term Goals - 08/08/20 1547      PT LONG TERM GOAL #1   Title Patient will be independent with HEP and its progression.    Time 6    Period Weeks    Status New      PT LONG TERM GOAL #2   Title Patient will demonstrate 140+ degrees of right shoulder flexion AROM to improve overhead activities.    Time 6    Period Weeks    Status New      PT LONG TERM GOAL #3   Title Patient will demonstrate 60+ degrees of right shoulder ER AROM to improve donning/doffing apparel.    Time 6    Period Weeks    Status New      PT LONG TERM GOAL #4   Title Patient will demonstrate 4/5 or greater right shoulder MMT to improve stability during functional tasks.    Time 6    Period Weeks    Status New      PT LONG TERM GOAL #5    Title Patient will report ability to perform ADLs and home activities independently with right shoulder pain less than or equal to 3/10.    Time 6    Period Weeks    Status New                 Plan - 08/25/20 1111    Clinical Impression Statement Patient arrives doing fairly well to therapy session but with reports of popping pain at top of shoulder with seated UE ranger particularly with circles. Patient reports popping was painful just felt like something was "rolling" inside. PROM provided to right shoulder with intermitent oscillations and gentle STW/M to decrease tone and pain. Vasopneumatic device performed only with no adverse affects.    Personal Factors and Comorbidities Comorbidity 2    Comorbidities R shoulder RTC repair DCE, subacromial decompression, and biceps tenodesis 07/27/2020; HTN    Examination-Activity Limitations Bathing;Locomotion Level;Transfers;Dressing;Hygiene/Grooming;Toileting    Examination-Participation Restrictions Cleaning;Meal Prep    Stability/Clinical Decision Making Stable/Uncomplicated    Clinical Decision Making Low    Rehab Potential Good    PT Frequency 3x / week    PT Duration 6 weeks    PT Treatment/Interventions ADLs/Self Care Home Management;Cryotherapy;Electrical Stimulation;Iontophoresis 4mg /ml Dexamethasone;Moist Heat;Ultrasound;Therapeutic activities;Therapeutic exercise;Manual techniques;Passive range of motion;Scar mobilization;Patient/family education;Vasopneumatic Device;Taping    PT Next Visit Plan cont AAROM; PROM to right shoulder modalities PRN for pain relief    PT Home Exercise Plan Pulley system for home    Consulted and Agree with Plan of Care Patient           Patient will benefit from skilled therapeutic intervention in order to improve the following deficits and impairments:  Decreased activity tolerance,Decreased strength,Increased edema,Postural dysfunction,Pain,Impaired UE functional use,Decreased range of  motion  Visit Diagnosis: Acute pain of right shoulder  Stiffness of right shoulder, not elsewhere classified  Muscle weakness (generalized)  Localized edema     Problem List Patient Active Problem List   Diagnosis Date Noted  . Spinal stenosis of cervical region 04/11/2020  . Vitamin D deficiency 12/12/2014  . Migraines 10/15/2012  . Essential hypertension, benign 10/15/2012  . Hyperlipidemia with target  LDL less than 100 10/15/2012    Guss Bunde, PT, DPT 08/25/2020, 12:07 PM  [facility] [address] [place], [place], [address] Phone: [phone]   Fax:  [phone]  Name: LOCHLYN ZULLO MRN: [id number] Date of Birth: 13-Mar-1961

## 2020-08-28 ENCOUNTER — Ambulatory Visit: Payer: Medicare Other | Admitting: Physical Therapy

## 2020-08-28 ENCOUNTER — Other Ambulatory Visit: Payer: Self-pay

## 2020-08-28 DIAGNOSIS — R600 Localized edema: Secondary | ICD-10-CM

## 2020-08-28 DIAGNOSIS — M25511 Pain in right shoulder: Secondary | ICD-10-CM

## 2020-08-28 DIAGNOSIS — M25611 Stiffness of right shoulder, not elsewhere classified: Secondary | ICD-10-CM

## 2020-08-28 DIAGNOSIS — M6281 Muscle weakness (generalized): Secondary | ICD-10-CM

## 2020-08-28 NOTE — Therapy (Signed)
[facility] [address] [place], [place], [address] Phone: [phone]   Fax:  [phone]  Physical Therapy Treatment  Patient Details  Name: [name] MRN: [id number] Date of Birth: 10/11/1960 Referring Provider (PT): Margarita Rana, MD   Encounter Date: 08/28/2020   PT End of Session - 08/28/20 0938    Visit Number 9    Number of Visits 18    Date for PT Re-Evaluation 09/26/20    Authorization Type Medicare (CQ modifier and KX modifier at 15th visit) FOTO; Progress note every 10th visit.    PT Start Time 0815    PT Stop Time 0904    PT Time Calculation (min) 49 min    Activity Tolerance Patient tolerated treatment well    Behavior During Therapy [facility] for tasks assessed/performed           Past Medical History:  Diagnosis Date  . HTN (hypertension)   . Hyperlipidemia   . Migraines     Past Surgical History:  Procedure Laterality Date  . CHOLECYSTECTOMY    . TUBAL LIGATION      There were no vitals filed for this visit.   Subjective Assessment - 08/28/20 0855    Subjective COVID-19 screen performed prior to patient entering clinic.  Woke up about 4 am with right shoulder pain.  Pain at 4 now.    Pertinent History R shoulder RTC repair, DCE, SAD, and biceps tenodesis 07/27/2020, HTN    Limitations House hold activities    Diagnostic tests MRI    Patient Stated Goals use arm normally again    Currently in Pain? Yes    Pain Score 4     Pain Location Shoulder    Pain Orientation Right    Pain Descriptors / Indicators Aching;Sore    Pain Type Surgical pain    Pain Onset More than a month ago                             [facility] Adult PT Treatment/Exercise - 08/28/20 0001      Modalities   Modalities Electrical Stimulation;Vasopneumatic      Electrical Stimulation   Electrical Stimulation Location RT shoulder.    Electrical Stimulation Action Low-level IFC at 80-150 Hz (non-motoric)     Electrical Stimulation Parameters 40% scan x 15 minutes.    Electrical Stimulation Goals Pain      Vasopneumatic   Vasopnuematic Location  Shoulder    Vasopneumatic Pressure Low   Pillow btw rt elbow and thorax.     Manual Therapy   Passive ROM In supine:  Gentle right shoulder PROM with low load long duration stretching technique x 24 minutes.                       PT Long Term Goals - 08/08/20 1547      PT LONG TERM GOAL #1   Title Patient will be independent with HEP and its progression.    Time 6    Period Weeks    Status New      PT LONG TERM GOAL #2   Title Patient will demonstrate 140+ degrees of right shoulder flexion AROM to improve overhead activities.    Time 6    Period Weeks    Status New      PT LONG TERM GOAL #3   Title Patient will demonstrate 60+ degrees of right shoulder ER AROM  to improve donning/doffing apparel.    Time 6    Period Weeks    Status New      PT LONG TERM GOAL #4   Title Patient will demonstrate 4/5 or greater right shoulder MMT to improve stability during functional tasks.    Time 6    Period Weeks    Status New      PT LONG TERM GOAL #5   Title Patient will report ability to perform ADLs and home activities independently with right shoulder pain less than or equal to 3/10.    Time 6    Period Weeks    Status New                 Plan - 08/28/20 5732    Clinical Impression Statement Patient did very well with passive range of motion to her right shoulder and tolerated without complaint.  She states she is very complinat to her HEP.    Personal Factors and Comorbidities Comorbidity 2    Comorbidities R shoulder RTC repair DCE, subacromial decompression, and biceps tenodesis 07/27/2020; HTN    Examination-Activity Limitations Bathing;Locomotion Level;Transfers;Dressing;Hygiene/Grooming;Toileting    Examination-Participation Restrictions Cleaning;Meal Prep    Stability/Clinical Decision Making Stable/Uncomplicated     Rehab Potential Good    PT Frequency 3x / week    PT Duration 6 weeks    PT Treatment/Interventions ADLs/Self Care Home Management;Cryotherapy;Electrical Stimulation;Iontophoresis 4mg /ml Dexamethasone;Moist Heat;Ultrasound;Therapeutic activities;Therapeutic exercise;Manual techniques;Passive range of motion;Scar mobilization;Patient/family education;Vasopneumatic Device;Taping    PT Next Visit Plan cont AAROM; PROM to right shoulder modalities PRN for pain relief    Consulted and Agree with Plan of Care Patient           Patient will benefit from skilled therapeutic intervention in order to improve the following deficits and impairments:  Decreased activity tolerance,Decreased strength,Increased edema,Postural dysfunction,Pain,Impaired UE functional use,Decreased range of motion  Visit Diagnosis: Acute pain of right shoulder  Stiffness of right shoulder, not elsewhere classified  Muscle weakness (generalized)  Localized edema     Problem List Patient Active Problem List   Diagnosis Date Noted  . Spinal stenosis of cervical region 04/11/2020  . Vitamin D deficiency 12/12/2014  . Migraines 10/15/2012  . Essential hypertension, benign 10/15/2012  . Hyperlipidemia with target LDL less than 100 10/15/2012    Iridian Reader, 12/15/2012  MPT 08/28/2020, 9:39 AM  [facility] [address] [place], [place], [place] Phone: [phone]   Fax:  [phone]  Name: DRAVEN LAINE MRN: [name] Date of Birth: 1961-06-18

## 2020-08-30 ENCOUNTER — Ambulatory Visit: Payer: Medicare Other | Admitting: *Deleted

## 2020-08-30 ENCOUNTER — Other Ambulatory Visit: Payer: Self-pay

## 2020-08-30 DIAGNOSIS — M6281 Muscle weakness (generalized): Secondary | ICD-10-CM

## 2020-08-30 DIAGNOSIS — R600 Localized edema: Secondary | ICD-10-CM

## 2020-08-30 DIAGNOSIS — M25511 Pain in right shoulder: Secondary | ICD-10-CM | POA: Diagnosis not present

## 2020-08-30 DIAGNOSIS — M25611 Stiffness of right shoulder, not elsewhere classified: Secondary | ICD-10-CM

## 2020-08-30 NOTE — Therapy (Signed)
[facility] [address] [place], [place], [address] Phone: [phone]   Fax:  [phone]  Physical Therapy Treatment  Patient Details  Name: [name] MRN: [id number] Date of Birth: May 04, 1961 Referring Provider (PT): Margarita Rana, MD   Encounter Date: 08/30/2020   PT End of Session - 08/30/20 0821    Visit Number 10    Number of Visits 18    Date for PT Re-Evaluation 09/26/20    Authorization Type Medicare (CQ modifier and KX modifier at 15th visit) FOTO; Progress note every 10th visit.  FOTO 10th visit  59%    PT Start Time 0815    PT Stop Time 0901    PT Time Calculation (min) 46 min           Past Medical History:  Diagnosis Date  . HTN (hypertension)   . Hyperlipidemia   . Migraines     Past Surgical History:  Procedure Laterality Date  . CHOLECYSTECTOMY    . TUBAL LIGATION      There were no vitals filed for this visit.   Subjective Assessment - 08/30/20 0820    Subjective COVID-19 screen performed prior to patient entering clinic.  4/10 today    Pertinent History R shoulder RTC repair, DCE, SAD, and biceps tenodesis 07/27/2020, HTN    Diagnostic tests MRI    Patient Stated Goals use arm normally again    Currently in Pain? Yes    Pain Score 4     Pain Location Shoulder    Pain Orientation Right    Pain Descriptors / Indicators Aching;Sore    Pain Onset More than a month ago                             [facility] Adult PT Treatment/Exercise - 08/30/20 0001      Exercises   Exercises Shoulder      Modalities   Modalities Electrical Stimulation;Vasopneumatic      Vasopneumatic   Number Minutes Vasopneumatic  15 minutes    Vasopnuematic Location  Shoulder    Vasopneumatic Pressure Low    Vasopneumatic Temperature  34 for pain and edema      Manual Therapy   Manual Therapy Passive ROM;Joint mobilization    Manual therapy comments PROM flexion to 150 degrees, ER 60 degrees     Passive ROM In supine:  Gentle right shoulder PROM for flexion, ER, abduction with low load long duration stretching technique                       PT Long Term Goals - 08/08/20 1547      PT LONG TERM GOAL #1   Title Patient will be independent with HEP and its progression.    Time 6    Period Weeks    Status New      PT LONG TERM GOAL #2   Title Patient will demonstrate 140+ degrees of right shoulder flexion AROM to improve overhead activities.    Time 6    Period Weeks    Status New      PT LONG TERM GOAL #3   Title Patient will demonstrate 60+ degrees of right shoulder ER AROM to improve donning/doffing apparel.    Time 6    Period Weeks    Status New      PT LONG TERM GOAL #4   Title Patient will demonstrate 4/5 or  greater right shoulder MMT to improve stability during functional tasks.    Time 6    Period Weeks    Status New      PT LONG TERM GOAL #5   Title Patient will report ability to perform ADLs and home activities independently with right shoulder pain less than or equal to 3/10.    Time 6    Period Weeks    Status New                 Plan - 08/30/20 0847    Clinical Impression Statement Pt arrived today with sling donned. PROM /PAROM to RT shldr for flexion 150 degrees, ER to 60 degrees, abduction. Minimal pain increase with PROM today. Vaso for pain/ swelling tolerated well.    Personal Factors and Comorbidities Comorbidity 2    Comorbidities R shoulder RTC repair DCE, subacromial decompression, and biceps tenodesis 07/27/2020; HTN    Examination-Activity Limitations Bathing;Locomotion Level;Transfers;Dressing;Hygiene/Grooming;Toileting    Stability/Clinical Decision Making Stable/Uncomplicated    Rehab Potential Good    PT Frequency 3x / week    PT Duration 6 weeks    PT Treatment/Interventions ADLs/Self Care Home Management;Cryotherapy;Electrical Stimulation;Iontophoresis 4mg /ml Dexamethasone;Moist Heat;Ultrasound;Therapeutic  activities;Therapeutic exercise;Manual techniques;Passive range of motion;Scar mobilization;Patient/family education;Vasopneumatic Device;Taping    PT Next Visit Plan cont AAROM; PROM to right shoulder modalities PRN for pain relief    PT Home Exercise Plan Pulley system for home    Consulted and Agree with Plan of Care Patient           Patient will benefit from skilled therapeutic intervention in order to improve the following deficits and impairments:  Decreased activity tolerance,Decreased strength,Increased edema,Postural dysfunction,Pain,Impaired UE functional use,Decreased range of motion  Visit Diagnosis: Acute pain of right shoulder  Stiffness of right shoulder, not elsewhere classified  Localized edema  Muscle weakness (generalized)     Problem List Patient Active Problem List   Diagnosis Date Noted  . Spinal stenosis of cervical region 04/11/2020  . Vitamin D deficiency 12/12/2014  . Migraines 10/15/2012  . Essential hypertension, benign 10/15/2012  . Hyperlipidemia with target LDL less than 100 10/15/2012    [name],[name], PTA 08/30/2020, 9:09 AM  [facility] [address] [place], [place], [place] Phone: [phone]   Fax:  [phone]  Name: [name] MRN: [name] Date of Birth: Nov 07, 1960

## 2020-09-04 ENCOUNTER — Ambulatory Visit: Payer: Medicare Other | Admitting: Physical Therapy

## 2020-09-06 ENCOUNTER — Ambulatory Visit: Payer: Medicare Other | Attending: Orthopedic Surgery | Admitting: Physical Therapy

## 2020-09-06 ENCOUNTER — Other Ambulatory Visit: Payer: Self-pay

## 2020-09-06 ENCOUNTER — Encounter: Payer: Self-pay | Admitting: Physical Therapy

## 2020-09-06 DIAGNOSIS — M6281 Muscle weakness (generalized): Secondary | ICD-10-CM | POA: Diagnosis present

## 2020-09-06 DIAGNOSIS — M25511 Pain in right shoulder: Secondary | ICD-10-CM | POA: Insufficient documentation

## 2020-09-06 DIAGNOSIS — R600 Localized edema: Secondary | ICD-10-CM | POA: Diagnosis present

## 2020-09-06 DIAGNOSIS — M25611 Stiffness of right shoulder, not elsewhere classified: Secondary | ICD-10-CM | POA: Insufficient documentation

## 2020-09-06 NOTE — Therapy (Signed)
[facility] [place], [place], [address] Phone: [phone]   Fax:  [phone]  Physical Therapy Treatment  Patient Details  Name: [name] MRN: [id number] Date of Birth: [birth year] Referring Provider (PT): Edmonia Lynch, MD   Encounter Date: 09/06/2020   PT End of Session - 09/06/20 1034    Visit Number 11    Number of Visits 18    Date for PT Re-Evaluation 09/26/20    Authorization Type Medicare (CQ modifier and KX modifier at 15th visit) FOTO; Progress note every 10th visit.  FOTO 10th visit  59%    PT Start Time 1032    PT Stop Time 1122    PT Time Calculation (min) 50 min    Activity Tolerance Patient tolerated treatment well    Behavior During Therapy WFL for tasks assessed/performed           Past Medical History:  Diagnosis Date  . HTN (hypertension)   . Hyperlipidemia   . Migraines     Past Surgical History:  Procedure Laterality Date  . CHOLECYSTECTOMY    . TUBAL LIGATION      There were no vitals filed for this visit.   Subjective Assessment - 09/06/20 1027    Subjective COVID-19 screen performed prior to patient entering clinic. MD pleased with treatment.    Pertinent History R shoulder RTC repair, DCE, SAD, and biceps tenodesis 07/27/2020, HTN    Limitations House hold activities    Diagnostic tests MRI    Patient Stated Goals use arm normally again    Currently in Pain? Yes    Pain Score 3     Pain Location Shoulder    Pain Orientation Right;Anterior    Pain Descriptors / Indicators Sharp    Pain Type Surgical pain    Pain Onset More than a month ago    Pain Frequency Intermittent                             [facility] Adult PT Treatment/Exercise - 09/06/20 0001      Modalities   Modalities Vasopneumatic      Vasopneumatic   Number Minutes Vasopneumatic  10 minutes    Vasopnuematic Location  Shoulder    Vasopneumatic Pressure Low    Vasopneumatic  Temperature  34 for pain and edema      Manual Therapy   Manual Therapy Passive ROM    Passive ROM PROM of R shoulder into flexion, ER, IR with holds at end range                       PT Long Term Goals - 09/06/20 1149      PT LONG TERM GOAL #1   Title Patient will be independent with HEP and its progression.    Time 6    Period Weeks    Status Partially Met      PT LONG TERM GOAL #2   Title Patient will demonstrate 140+ degrees of right shoulder flexion AROM to improve overhead activities.    Time 6    Period Weeks    Status On-going      PT LONG TERM GOAL #3   Title Patient will demonstrate 60+ degrees of right shoulder ER AROM to improve donning/doffing apparel.    Time 6    Period Weeks    Status On-going      PT LONG TERM  GOAL #4   Title Patient will demonstrate 4/5 or greater right shoulder MMT to improve stability during functional tasks.    Time 6    Period Weeks    Status On-going      PT LONG TERM GOAL #5   Title Patient will report ability to perform ADLs and home activities independently with right shoulder pain less than or equal to 3/10.    Time 6    Period Weeks    Status On-going                 Plan - 09/06/20 1141    Clinical Impression Statement Patient returned from MD visit in which he was pleased with her progress. Patient able to progress to more ROM exercises next week and get rid of sling next week as well. Patient able to tolerate PROM into flexion fairly well but greater initial discomfort and limitation into ER. As progression of PROM into ER continued, less pain reported and less guarding. Firm end feels and smooth arc of motion noted during PROM of R shoulder. Normal vasopneumatic response noted following removal of the modality.    Personal Factors and Comorbidities Comorbidity 2    Comorbidities R shoulder RTC repair DCE, subacromial decompression, and biceps tenodesis 07/27/2020; HTN    Examination-Activity Limitations  Bathing;Locomotion Level;Transfers;Dressing;Hygiene/Grooming;Toileting    Examination-Participation Restrictions Cleaning;Meal Prep    Stability/Clinical Decision Making Stable/Uncomplicated    Rehab Potential Good    PT Frequency 3x / week    PT Duration 6 weeks    PT Treatment/Interventions ADLs/Self Care Home Management;Cryotherapy;Electrical Stimulation;Iontophoresis 26m/ml Dexamethasone;Moist Heat;Ultrasound;Therapeutic activities;Therapeutic exercise;Manual techniques;Passive range of motion;Scar mobilization;Patient/family education;Vasopneumatic Device;Taping    PT Next Visit Plan cont AAROM; PROM to right shoulder modalities PRN for pain relief    PT Home Exercise Plan Pulley system for home    Consulted and Agree with Plan of Care Patient           Patient will benefit from skilled therapeutic intervention in order to improve the following deficits and impairments:  Decreased activity tolerance,Decreased strength,Increased edema,Postural dysfunction,Pain,Impaired UE functional use,Decreased range of motion  Visit Diagnosis: Acute pain of right shoulder  Stiffness of right shoulder, not elsewhere classified  Localized edema  Muscle weakness (generalized)     Problem List Patient Active Problem List   Diagnosis Date Noted  . Spinal stenosis of cervical region 04/11/2020  . Vitamin D deficiency 12/12/2014  . Migraines 10/15/2012  . Essential hypertension, benign 10/15/2012  . Hyperlipidemia with target LDL less than 100 10/15/2012    KStandley Brooking PTA 09/06/2020, 11:50 AM  [facility] [address]Phone: [phone]  Fax:  [phone] Name: Linda SANDUSKYMRN: [id number] of Birth: 607/30/1962

## 2020-09-08 ENCOUNTER — Ambulatory Visit: Payer: Medicare Other | Admitting: Physical Therapy

## 2020-09-08 ENCOUNTER — Other Ambulatory Visit: Payer: Self-pay

## 2020-09-08 ENCOUNTER — Encounter: Payer: Self-pay | Admitting: Physical Therapy

## 2020-09-08 DIAGNOSIS — M25611 Stiffness of right shoulder, not elsewhere classified: Secondary | ICD-10-CM

## 2020-09-08 DIAGNOSIS — M25511 Pain in right shoulder: Secondary | ICD-10-CM | POA: Diagnosis not present

## 2020-09-08 DIAGNOSIS — R600 Localized edema: Secondary | ICD-10-CM

## 2020-09-08 DIAGNOSIS — M6281 Muscle weakness (generalized): Secondary | ICD-10-CM

## 2020-09-08 NOTE — Therapy (Signed)
[facility] [place], [place], [address] Phone: [phone]   Fax:  [phone]  Physical Therapy Treatment  Patient Details  Name: [name] MRN: [id number] Date of Birth: 1960-09-13 Referring Provider (PT): Edmonia Lynch, MD   Encounter Date: 09/08/2020   PT End of Session - 09/08/20 1118    Visit Number 12    Number of Visits 18    Date for PT Re-Evaluation 09/26/20    Authorization Type Medicare (CQ modifier and KX modifier at 15th visit) FOTO; Progress note every 10th visit.  FOTO 10th visit  59%    PT Start Time 1030    PT Stop Time 1120    PT Time Calculation (min) 50 min    Activity Tolerance Patient tolerated treatment well    Behavior During Therapy WFL for tasks assessed/performed           Past Medical History:  Diagnosis Date  . HTN (hypertension)   . Hyperlipidemia   . Migraines     Past Surgical History:  Procedure Laterality Date  . CHOLECYSTECTOMY    . TUBAL LIGATION      There were no vitals filed for this visit.   Subjective Assessment - 09/08/20 1037    Subjective COVID-19 screen performed prior to patient entering clinic. Patient arrives stating she fell and hit her right shoulder on her son's truck when she slipped on some leaves yesterday. Patient reported pain is about 4/10 but let it rest all throughout the day yesterday.    Pertinent History R shoulder RTC repair, DCE, SAD, and biceps tenodesis 07/27/2020, HTN    Limitations House hold activities    Diagnostic tests MRI    Patient Stated Goals use arm normally again    Currently in Pain? Yes    Pain Score 4     Pain Location Shoulder    Pain Orientation Right;Anterior    Pain Descriptors / Indicators Sharp    Pain Type Surgical pain    Pain Onset More than a month ago    Pain Frequency Intermittent              [facility] PT Assessment - 09/08/20 0001      Assessment   Medical Diagnosis Right shoulder arthroscopic  extensive debridement, distal clavicle excision, subacromial decompression, RTC repair of the subscaularis and supraspinatus, biceps tenodesis    Referring Provider (PT) Edmonia Lynch, MD    Onset Date/Surgical Date 07/27/20    Hand Dominance Right    Next MD Visit 10/09/2020    Prior Therapy no                         [facility] Adult PT Treatment/Exercise - 09/08/20 0001      Modalities   Modalities Vasopneumatic;Electrical Stimulation      Electrical Stimulation   Electrical Stimulation Location RT shoulder.    Electrical Stimulation Action IFC    Electrical Stimulation Parameters 80-150 hz x15 mins    Electrical Stimulation Goals Pain      Vasopneumatic   Number Minutes Vasopneumatic  15 minutes    Vasopnuematic Location  Shoulder    Vasopneumatic Pressure Low    Vasopneumatic Temperature  34 for pain and edema      Manual Therapy   Manual Therapy Passive ROM    Passive ROM PROM of R shoulder into flexion, ER, IR with holds at end range  PT Long Term Goals - 09/06/20 1149      PT LONG TERM GOAL #1   Title Patient will be independent with HEP and its progression.    Time 6    Period Weeks    Status Partially Met      PT LONG TERM GOAL #2   Title Patient will demonstrate 140+ degrees of right shoulder flexion AROM to improve overhead activities.    Time 6    Period Weeks    Status On-going      PT LONG TERM GOAL #3   Title Patient will demonstrate 60+ degrees of right shoulder ER AROM to improve donning/doffing apparel.    Time 6    Period Weeks    Status On-going      PT LONG TERM GOAL #4   Title Patient will demonstrate 4/5 or greater right shoulder MMT to improve stability during functional tasks.    Time 6    Period Weeks    Status On-going      PT LONG TERM GOAL #5   Title Patient will report ability to perform ADLs and home activities independently with right shoulder pain less than or equal to 3/10.    Time  6    Period Weeks    Status On-going                 Plan - 09/08/20 1118    Clinical Impression Statement Patient arrived with 4/10 right shoulder pain and UT tightness after falling into her son's truck yesterday. Patient did not take pain medication to assess pain levels yesterday and felt as though she did not need to call MD as pain levels remained 4/10.  Patient provided with very gentle PROM to R shoulder and STW/M and TPR to right UT to decrease tone and pain. Patient noted with intermittent guarding but overall responsed well to ROM. Patient instructed to be cautious with shoulder and monitor pain throughout the weekend. Patient requested E-stim for pain relief along with vasopneumatic device. normal response upon removal of modalities.    Personal Factors and Comorbidities Comorbidity 2    Comorbidities R shoulder RTC repair DCE, subacromial decompression, and biceps tenodesis 07/27/2020; HTN    Examination-Activity Limitations Bathing;Locomotion Level;Transfers;Dressing;Hygiene/Grooming;Toileting    Examination-Participation Restrictions Cleaning;Meal Prep    Stability/Clinical Decision Making Stable/Uncomplicated    Clinical Decision Making Low    Rehab Potential Good    PT Frequency 3x / week    PT Duration 6 weeks    PT Treatment/Interventions ADLs/Self Care Home Management;Cryotherapy;Electrical Stimulation;Iontophoresis 51m/ml Dexamethasone;Moist Heat;Ultrasound;Therapeutic activities;Therapeutic exercise;Manual techniques;Passive range of motion;Scar mobilization;Patient/family education;Vasopneumatic Device;Taping    PT Next Visit Plan cont AAROM; PROM to right shoulder modalities PRN for pain relief    PT Home Exercise Plan Pulley system for home    Consulted and Agree with Plan of Care Patient           Patient will benefit from skilled therapeutic intervention in order to improve the following deficits and impairments:  Decreased activity tolerance,Decreased  strength,Increased edema,Postural dysfunction,Pain,Impaired UE functional use,Decreased range of motion  Visit Diagnosis: Acute pain of right shoulder  Stiffness of right shoulder, not elsewhere classified  Localized edema  Muscle weakness (generalized)     Problem List Patient Active Problem List   Diagnosis Date Noted  . Spinal stenosis of cervical region 04/11/2020  . Vitamin D deficiency 12/12/2014  . Migraines 10/15/2012  . Essential hypertension, benign 10/15/2012  . Hyperlipidemia with target LDL less than  100 10/15/2012    Zuly Belkin 09/08/2020, 11:30 AM  [facility] [place], [place], [address] Phone: [phone]   Fax:  [phone]  Name: [name] MRN: [id number] Date of Birth: [birth year]

## 2020-09-11 ENCOUNTER — Ambulatory Visit: Payer: Medicare Other | Admitting: Physical Therapy

## 2020-09-11 ENCOUNTER — Other Ambulatory Visit: Payer: Self-pay

## 2020-09-11 DIAGNOSIS — M6281 Muscle weakness (generalized): Secondary | ICD-10-CM

## 2020-09-11 DIAGNOSIS — M25511 Pain in right shoulder: Secondary | ICD-10-CM | POA: Diagnosis not present

## 2020-09-11 DIAGNOSIS — M25611 Stiffness of right shoulder, not elsewhere classified: Secondary | ICD-10-CM

## 2020-09-11 DIAGNOSIS — R600 Localized edema: Secondary | ICD-10-CM

## 2020-09-11 NOTE — Therapy (Signed)
[facility] [place], [place], [address] Phone: [phone]   Fax:  [phone]  Physical Therapy Treatment  Patient Details  Name: [name] MRN: [id number] Date of Birth: 08-08-1960 Referring Provider (PT): Linda Lynch, MD   Encounter Date: 09/11/2020   PT End of Session - 09/11/20 1319    Visit Number 13    Number of Visits 18    Authorization Type Medicare (CQ modifier and KX modifier at 15th visit) FOTO; Progress note every 10th visit.  FOTO 10th visit  59%    PT Start Time 1030    PT Stop Time 1116    PT Time Calculation (min) 46 min    Activity Tolerance Patient tolerated treatment well    Behavior During Therapy WFL for tasks assessed/performed           Past Medical History:  Diagnosis Date  . HTN (hypertension)   . Hyperlipidemia   . Migraines     Past Surgical History:  Procedure Laterality Date  . CHOLECYSTECTOMY    . TUBAL LIGATION      There were no vitals filed for this visit.   Subjective Assessment - 09/11/20 1304    Subjective COVID-19 screening performed upon arrival. Patient arrived doing much better than Friday, 2/10.    Pertinent History R shoulder RTC repair, DCE, SAD, and biceps tenodesis 07/27/2020, HTN    Limitations House hold activities    Diagnostic tests MRI    Patient Stated Goals use arm normally again    Currently in Pain? Yes    Pain Score 2     Pain Location Shoulder    Pain Orientation Right;Anterior    Pain Descriptors / Indicators Sore    Pain Type Surgical pain    Pain Onset More than a month ago    Pain Frequency Intermittent              [facility] PT Assessment - 09/11/20 0001      Assessment   Medical Diagnosis Right shoulder arthroscopic extensive debridement, distal clavicle excision, subacromial decompression, RTC repair of the subscaularis and supraspinatus, biceps tenodesis    Referring Provider (PT) Linda Lynch, MD    Onset Date/Surgical  Date 07/27/20    Hand Dominance Right    Next MD Visit 10/09/2020    Prior Therapy no                         [facility] Adult PT Treatment/Exercise - 09/11/20 0001      Shoulder Exercises: Isometric Strengthening   Flexion 5X5"    Extension 5X5"    External Rotation 5X5"    Internal Rotation 5X5"    ABduction 5X5"      Modalities   Modalities Vasopneumatic      Vasopneumatic   Number Minutes Vasopneumatic  15 minutes    Vasopnuematic Location  Shoulder    Vasopneumatic Pressure Low    Vasopneumatic Temperature  34 for pain and edema      Manual Therapy   Manual Therapy Passive ROM    Passive ROM PROM of R shoulder into flexion, ER, IR with holds at end range                       PT Long Term Goals - 09/06/20 1149      PT LONG TERM GOAL #1   Title Patient will be independent with HEP and its progression.  Time 6    Period Weeks    Status Partially Met      PT LONG TERM GOAL #2   Title Patient will demonstrate 140+ degrees of right shoulder flexion AROM to improve overhead activities.    Time 6    Period Weeks    Status On-going      PT LONG TERM GOAL #3   Title Patient will demonstrate 60+ degrees of right shoulder ER AROM to improve donning/doffing apparel.    Time 6    Period Weeks    Status On-going      PT LONG TERM GOAL #4   Title Patient will demonstrate 4/5 or greater right shoulder MMT to improve stability during functional tasks.    Time 6    Period Weeks    Status On-going      PT LONG TERM GOAL #5   Title Patient will report ability to perform ADLs and home activities independently with right shoulder pain less than or equal to 3/10.    Time 6    Period Weeks    Status On-going                 Plan - 09/11/20 1307    Clinical Impression Statement Patient arrived to physical therapy with less pain that last session. Patient responded well to PROM to all planes of motion. More discomfort noted with scapular  blocking but overall responded well. Isometrics initiated today with good response. Isometrics provided for HEP as well as handout for TENS unit. Normal response to modalties upon removal.    Personal Factors and Comorbidities Comorbidity 2    Comorbidities R shoulder RTC repair DCE, subacromial decompression, and biceps tenodesis 07/27/2020; HTN    Examination-Activity Limitations Bathing;Locomotion Level;Transfers;Dressing;Hygiene/Grooming;Toileting    Examination-Participation Restrictions Cleaning;Meal Prep    Stability/Clinical Decision Making Stable/Uncomplicated    Clinical Decision Making Low    Rehab Potential Good    PT Frequency 3x / week    PT Duration 6 weeks    PT Treatment/Interventions ADLs/Self Care Home Management;Cryotherapy;Electrical Stimulation;Iontophoresis 70m/ml Dexamethasone;Moist Heat;Ultrasound;Therapeutic activities;Therapeutic exercise;Manual techniques;Passive range of motion;Scar mobilization;Patient/family education;Vasopneumatic Device;Taping    PT Next Visit Plan cont AAROM; PROM to right shoulder modalities PRN for pain relief    PT Home Exercise Plan Pulley system for home    Consulted and Agree with Plan of Care Patient           Patient will benefit from skilled therapeutic intervention in order to improve the following deficits and impairments:  Decreased activity tolerance,Decreased strength,Increased edema,Postural dysfunction,Pain,Impaired UE functional use,Decreased range of motion  Visit Diagnosis: Acute pain of right shoulder  Stiffness of right shoulder, not elsewhere classified  Localized edema  Muscle weakness (generalized)     Problem List Patient Active Problem List   Diagnosis Date Noted  . Spinal stenosis of cervical region 04/11/2020  . Vitamin D deficiency 12/12/2014  . Migraines 10/15/2012  . Essential hypertension, benign 10/15/2012  . Hyperlipidemia with target LDL less than 100 10/15/2012    KGabriela Carr PT,  DPT 09/11/2020, 1:53 PM  [facility] [place] [address]Phone: [phone]  Fax:  [phone] Name: Linda BIANCARDIMRN: [id number] of Birth: 607-16-1962

## 2020-09-13 ENCOUNTER — Ambulatory Visit: Payer: Medicare Other | Admitting: *Deleted

## 2020-09-13 ENCOUNTER — Other Ambulatory Visit: Payer: Self-pay

## 2020-09-13 DIAGNOSIS — R600 Localized edema: Secondary | ICD-10-CM

## 2020-09-13 DIAGNOSIS — M25511 Pain in right shoulder: Secondary | ICD-10-CM | POA: Diagnosis not present

## 2020-09-13 DIAGNOSIS — M25611 Stiffness of right shoulder, not elsewhere classified: Secondary | ICD-10-CM

## 2020-09-13 DIAGNOSIS — M6281 Muscle weakness (generalized): Secondary | ICD-10-CM

## 2020-09-13 NOTE — Therapy (Signed)
[facility] [place], [place], [address] Phone: [phone]   Fax:  [phone]  Physical Therapy Treatment  Patient Details  Name: [name] MRN: [id number] Date of Birth: [birth year] Referring Provider (PT): Edmonia Lynch, MD   Encounter Date: 09/13/2020   PT End of Session - 09/13/20 1041    Visit Number 14    Number of Visits 18    Date for PT Re-Evaluation 09/26/20    Authorization Type Medicare (CQ modifier and KX modifier at 15th visit) FOTO; Progress note every 10th visit.  FOTO 10th visit  59%    PT Start Time 1030    PT Stop Time 1125    PT Time Calculation (min) 55 min    Activity Tolerance Patient tolerated treatment well    Behavior During Therapy WFL for tasks assessed/performed           Past Medical History:  Diagnosis Date  . HTN (hypertension)   . Hyperlipidemia   . Migraines     Past Surgical History:  Procedure Laterality Date  . CHOLECYSTECTOMY    . TUBAL LIGATION      There were no vitals filed for this visit.   Subjective Assessment - 09/13/20 1039    Subjective COVID-19 screening performed upon arrival. Patient arrived doing much better than Friday, 5/10.    Pertinent History R shoulder RTC repair, DCE, SAD, and biceps tenodesis 07/27/2020, HTN    Limitations House hold activities    Diagnostic tests MRI    Patient Stated Goals use arm normally again    Currently in Pain? Yes    Pain Score 5     Pain Location Shoulder    Pain Orientation Anterior    Pain Descriptors / Indicators Sore    Pain Type Surgical pain    Pain Onset More than a month ago                             [facility] Adult PT Treatment/Exercise - 09/13/20 0001      Shoulder Exercises: Pulleys   Flexion 5 minutes    Other Pulley Exercises Sitting UE ranger flex/ ext , CW, CCW x 5 mins      Shoulder Exercises: Isometric Strengthening   Flexion 5X5"    Extension 5X5"   cues to decrease  effort to 20% with all isometric exs   External Rotation 5X5"    Internal Rotation 5X5"    ABduction 5X5"      Modalities   Modalities Vasopneumatic      Vasopneumatic   Number Minutes Vasopneumatic  15 minutes    Vasopnuematic Location  Shoulder    Vasopneumatic Pressure Low    Vasopneumatic Temperature  34 for pain and edema      Manual Therapy   Manual Therapy Passive ROM    Passive ROM PROM of R shoulder into flexion, ABD, ER, IR with holds at end range                       PT Long Term Goals - 09/06/20 1149      PT LONG TERM GOAL #1   Title Patient will be independent with HEP and its progression.    Time 6    Period Weeks    Status Partially Met      PT LONG TERM GOAL #2   Title Patient will demonstrate 140+ degrees of right  shoulder flexion AROM to improve overhead activities.    Time 6    Period Weeks    Status On-going      PT LONG TERM GOAL #3   Title Patient will demonstrate 60+ degrees of right shoulder ER AROM to improve donning/doffing apparel.    Time 6    Period Weeks    Status On-going      PT LONG TERM GOAL #4   Title Patient will demonstrate 4/5 or greater right shoulder MMT to improve stability during functional tasks.    Time 6    Period Weeks    Status On-going      PT LONG TERM GOAL #5   Title Patient will report ability to perform ADLs and home activities independently with right shoulder pain less than or equal to 3/10.    Time 6    Period Weeks    Status On-going                 Plan - 09/13/20 1053    Clinical Impression Statement Pt arrived today doing ok , but sore from doing isometrics at home. Pt did well with pulleys, UE ranger and isometrics. Verbal and tactile cues for isometric intensity for 20% effort for activation. Pt report pushing to hard at home. Improved ROM ER to 80 degrees and flexion and abduction to 155 degrees.    Comorbidities R shoulder RTC repair DCE, subacromial decompression, and biceps  tenodesis 07/27/2020; HTN    Examination-Activity Limitations Bathing;Locomotion Level;Transfers;Dressing;Hygiene/Grooming;Toileting    Stability/Clinical Decision Making Stable/Uncomplicated    PT Frequency 3x / week    PT Duration 6 weeks    PT Treatment/Interventions ADLs/Self Care Home Management;Cryotherapy;Electrical Stimulation;Iontophoresis 46m/ml Dexamethasone;Moist Heat;Ultrasound;Therapeutic activities;Therapeutic exercise;Manual techniques;Passive range of motion;Scar mobilization;Patient/family education;Vasopneumatic Device;Taping    PT Next Visit Plan cont AAROM; PROM to right shoulder modalities PRN for pain relief    Consulted and Agree with Plan of Care Patient           Patient will benefit from skilled therapeutic intervention in order to improve the following deficits and impairments:  Decreased activity tolerance,Decreased strength,Increased edema,Postural dysfunction,Pain,Impaired UE functional use,Decreased range of motion  Visit Diagnosis: Acute pain of right shoulder  Stiffness of right shoulder, not elsewhere classified  Localized edema  Muscle weakness (generalized)     Problem List Patient Active Problem List   Diagnosis Date Noted  . Spinal stenosis of cervical region 04/11/2020  . Vitamin D deficiency 12/12/2014  . Migraines 10/15/2012  . Essential hypertension, benign 10/15/2012  . Hyperlipidemia with target LDL less than 100 10/15/2012    Truitt Cruey,[name], PTA 09/13/2020, 11:43 AM  [facility] [address]Phone: [phone]  Fax:  [phone] Name: MNDEYE TENORIOMRN: [id number] of Birth: 610/10/1960

## 2020-09-18 ENCOUNTER — Other Ambulatory Visit: Payer: Self-pay

## 2020-09-18 ENCOUNTER — Ambulatory Visit: Payer: Medicare Other | Admitting: Physical Therapy

## 2020-09-18 DIAGNOSIS — M25511 Pain in right shoulder: Secondary | ICD-10-CM

## 2020-09-18 DIAGNOSIS — R600 Localized edema: Secondary | ICD-10-CM

## 2020-09-18 DIAGNOSIS — M25611 Stiffness of right shoulder, not elsewhere classified: Secondary | ICD-10-CM

## 2020-09-18 DIAGNOSIS — M6281 Muscle weakness (generalized): Secondary | ICD-10-CM
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
## 2020-09-20 ENCOUNTER — Other Ambulatory Visit: Payer: Self-pay

## 2020-09-20 ENCOUNTER — Ambulatory Visit: Payer: Medicare Other | Admitting: *Deleted

## 2020-09-20 DIAGNOSIS — M25611 Stiffness of right shoulder, not elsewhere classified: Secondary | ICD-10-CM

## 2020-09-20 DIAGNOSIS — M25511 Pain in right shoulder: Secondary | ICD-10-CM | POA: Diagnosis not present

## 2020-09-20 DIAGNOSIS — R600 Localized edema: Secondary | ICD-10-CM

## 2020-09-20 DIAGNOSIS — M6281 Muscle weakness (generalized): Secondary | ICD-10-CM
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
## 2020-09-25 ENCOUNTER — Ambulatory Visit: Payer: Medicare Other | Admitting: Physical Therapy

## 2020-09-25 ENCOUNTER — Encounter: Payer: Self-pay | Admitting: Physical Therapy

## 2020-09-25 ENCOUNTER — Other Ambulatory Visit: Payer: Self-pay

## 2020-09-25 DIAGNOSIS — R600 Localized edema: Secondary | ICD-10-CM

## 2020-09-25 DIAGNOSIS — M25511 Pain in right shoulder: Secondary | ICD-10-CM

## 2020-09-25 DIAGNOSIS — M6281 Muscle weakness (generalized): Secondary | ICD-10-CM

## 2020-09-25 DIAGNOSIS — M25611 Stiffness of right shoulder, not elsewhere classified: Secondary | ICD-10-CM

## 2020-09-25 NOTE — Therapy (Signed)
[facility] [place], [place], [address] Phone: [phone]   Fax:  [phone]  Physical Therapy Treatment  Patient Details  Name: [name] MRN: [id number] Date of Birth: 1960/11/17 Referring Provider (PT): Edmonia Lynch, MD   Encounter Date: 09/25/2020   PT End of Session - 09/25/20 1041    Visit Number 17    Number of Visits 24    Date for PT Re-Evaluation 11/03/20    Authorization Type Medicare (CQ modifier and KX modifier at 15th visit) FOTO; Progress note every 10th visit.  FOTO 10th visit  59%    PT Start Time 1030    PT Stop Time 1118    PT Time Calculation (min) 48 min    Activity Tolerance Patient tolerated treatment well    Behavior During Therapy WFL for tasks assessed/performed           Past Medical History:  Diagnosis Date  . HTN (hypertension)   . Hyperlipidemia   . Migraines     Past Surgical History:  Procedure Laterality Date  . CHOLECYSTECTOMY    . TUBAL LIGATION      There were no vitals filed for this visit.   Subjective Assessment - 09/25/20 1039    Subjective COVID-19 screening performed upon arrival. Patient arrived with 2/10 pain in right shoulder.    Pertinent History R shoulder RTC repair, DCE, SAD, and biceps tenodesis 07/27/2020, HTN    Limitations House hold activities    Diagnostic tests MRI    Patient Stated Goals use arm normally again    Currently in Pain? Yes    Pain Score 2     Pain Location Shoulder    Pain Orientation Right    Pain Descriptors / Indicators Aching;Sore    Pain Type Surgical pain    Pain Onset More than a month ago    Pain Frequency Constant                             [facility] Adult PT Treatment/Exercise - 09/25/20 0001      Shoulder Exercises: Supine   External Rotation AAROM;Right;10 reps;20 reps    Other Supine Exercises supine cane press and flexion 3x10 each      Shoulder Exercises: Pulleys   Flexion 5 minutes     Other Pulley Exercises Standing  UE ranger flex/ ext , CW, CCW x 5 mins      Modalities   Modalities Vasopneumatic      Vasopneumatic   Number Minutes Vasopneumatic  10 minutes    Vasopnuematic Location  Shoulder    Vasopneumatic Pressure Low    Vasopneumatic Temperature  34 for pain and edema      Manual Therapy   Manual Therapy Passive ROM    Passive ROM PROM of R shoulder  ER, IR with holds at end range. Rhythmic stab for IR/ER and at 100 degrees elevation                       PT Long Term Goals - 09/06/20 1149      PT LONG TERM GOAL #1   Title Patient will be independent with HEP and its progression.    Time 6    Period Weeks    Status Partially Met      PT LONG TERM GOAL #2   Title Patient will demonstrate 140+ degrees of right shoulder flexion AROM to  improve overhead activities.    Time 6    Period Weeks    Status On-going      PT LONG TERM GOAL #3   Title Patient will demonstrate 60+ degrees of right shoulder ER AROM to improve donning/doffing apparel.    Time 6    Period Weeks    Status On-going      PT LONG TERM GOAL #4   Title Patient will demonstrate 4/5 or greater right shoulder MMT to improve stability during functional tasks.    Time 6    Period Weeks    Status On-going      PT LONG TERM GOAL #5   Title Patient will report ability to perform ADLs and home activities independently with right shoulder pain less than or equal to 3/10.    Time 6    Period Weeks    Status On-going                 Plan - 09/25/20 1050    Clinical Impression Statement Patient responded well to therapy session though with reports of right posterior shoulder discomfort, particularly with standing ranger TEs. Patient was able to perform supine AAROM TEs with good technique and minimal pain. PROM provided with excellent ER and flexion ROM, still some tightness with glenohumeral IR in comparison to shoulder complex IR. No adverse affects upon removal of  modalities. Recertification sent to request more visits to address ongoing deficits.    Personal Factors and Comorbidities Comorbidity 2    Comorbidities R shoulder RTC repair DCE, subacromial decompression, and biceps tenodesis 07/27/2020; HTN    Examination-Activity Limitations Bathing;Locomotion Level;Transfers;Dressing;Hygiene/Grooming;Toileting    Examination-Participation Restrictions Cleaning;Meal Prep    Stability/Clinical Decision Making Stable/Uncomplicated    Clinical Decision Making Low    Rehab Potential Good    PT Frequency 3x / week    PT Duration 6 weeks    PT Treatment/Interventions ADLs/Self Care Home Management;Cryotherapy;Electrical Stimulation;Iontophoresis 81m/ml Dexamethasone;Moist Heat;Ultrasound;Therapeutic activities;Therapeutic exercise;Manual techniques;Passive range of motion;Scar mobilization;Patient/family education;Vasopneumatic Device;Taping    PT Next Visit Plan begin supine wand exercises per tolerance; cont AAROM; PROM to right shoulder modalities PRN for pain relief   8 weeks post-op 09-21-20    PT Home Exercise Plan Pulley system for home    Consulted and Agree with Plan of Care Patient           Patient will benefit from skilled therapeutic intervention in order to improve the following deficits and impairments:  Decreased activity tolerance,Decreased strength,Increased edema,Postural dysfunction,Pain,Impaired UE functional use,Decreased range of motion  Visit Diagnosis: Acute pain of right shoulder - Plan: PT plan of care cert/re-cert  Stiffness of right shoulder, not elsewhere classified - Plan: PT plan of care cert/re-cert  Localized edema - Plan: PT plan of care cert/re-cert  Muscle weakness (generalized) - Plan: PT plan of care cert/re-cert     Problem List Patient Active Problem List   Diagnosis Date Noted  . Spinal stenosis of cervical region 04/11/2020  . Vitamin D deficiency 12/12/2014  . Migraines 10/15/2012  . Essential  hypertension, benign 10/15/2012  . Hyperlipidemia with target LDL less than 100 10/15/2012    KGabriela Eves PT, DPT 09/25/2020, 12:32 PM  [facility] [address] [place] [address]Phone: [phone]  Fax:  [phone] Name: MDORINNE GRAEFFMRN: [id number] of Birth: [birth year]

## 2020-09-26 ENCOUNTER — Ambulatory Visit: Payer: Medicare Other

## 2020-09-27 ENCOUNTER — Encounter: Payer: Self-pay | Admitting: Physical Therapy

## 2020-09-27 ENCOUNTER — Other Ambulatory Visit: Payer: Self-pay

## 2020-09-27 ENCOUNTER — Ambulatory Visit: Payer: Medicare Other | Admitting: Physical Therapy

## 2020-09-27 DIAGNOSIS — M25511 Pain in right shoulder: Secondary | ICD-10-CM | POA: Diagnosis not present

## 2020-09-27 DIAGNOSIS — M6281 Muscle weakness (generalized): Secondary | ICD-10-CM

## 2020-09-27 DIAGNOSIS — R600 Localized edema: Secondary | ICD-10-CM

## 2020-09-27 DIAGNOSIS — M25611 Stiffness of right shoulder, not elsewhere classified: Secondary | ICD-10-CM

## 2020-09-27 NOTE — Therapy (Signed)
[facility] [place], [place], [address] Phone: [phone]   Fax:  [phone]  Physical Therapy Treatment  Patient Details  Name: [name] MRN: [id number] Date of Birth: [birth year] Referring Provider (PT): Edmonia Lynch, MD   Encounter Date: 09/27/2020   PT End of Session - 09/27/20 1204    Visit Number 18    Number of Visits 24    Date for PT Re-Evaluation 11/03/20    Authorization Type Medicare (CQ modifier and KX modifier at 15th visit) FOTO; Progress note every 10th visit.  FOTO 10th visit  59%    PT Start Time 1031    PT Stop Time 1130    PT Time Calculation (min) 59 min    Activity Tolerance Patient tolerated treatment well    Behavior During Therapy WFL for tasks assessed/performed           Past Medical History:  Diagnosis Date  . HTN (hypertension)   . Hyperlipidemia   . Migraines     Past Surgical History:  Procedure Laterality Date  . CHOLECYSTECTOMY    . TUBAL LIGATION      There were no vitals filed for this visit.   Subjective Assessment - 09/27/20 1120    Subjective COVID-19 screening performed upon arrival. Reports she didn't get to do HEP as she was running multiple errands yesterday.    Pertinent History R shoulder RTC repair, DCE, SAD, and biceps tenodesis 07/27/2020, HTN    Limitations House hold activities    Diagnostic tests MRI    Patient Stated Goals use arm normally again    Currently in Pain? Other (Comment)   No pain assessment provided             [facility] PT Assessment - 09/27/20 0001      Assessment   Medical Diagnosis Right shoulder arthroscopic extensive debridement, distal clavicle excision, subacromial decompression, RTC repair of the subscaularis and supraspinatus, biceps tenodesis    Referring Provider (PT) Edmonia Lynch, MD    Onset Date/Surgical Date 07/27/20    Hand Dominance Right    Next MD Visit 10/09/2020    Prior Therapy no      Precautions    Precautions Shoulder    Precaution Comments per RTC protocol                         [facility] Adult PT Treatment/Exercise - 09/27/20 0001      Shoulder Exercises: Supine   Flexion AAROM;Both;20 reps      Shoulder Exercises: Prone   Retraction AROM;Right;20 reps    Extension AROM;Right;20 reps    Horizontal ABduction 1 AROM;Right;20 reps      Shoulder Exercises: Sidelying   External Rotation AROM;Right;20 reps    Flexion AROM;Right;20 reps      Shoulder Exercises: Standing   External Rotation Strengthening;Right;20 reps;Theraband    Theraband Level (Shoulder External Rotation) Level 1 (Yellow)    Internal Rotation Strengthening;Right;20 reps;Theraband    Theraband Level (Shoulder Internal Rotation) Level 1 (Yellow)    Extension Strengthening;Right;20 reps;Theraband    Theraband Level (Shoulder Extension) Level 1 (Yellow)    Other Standing Exercises Wall ladder x5 reps   max at 23     Shoulder Exercises: Pulleys   Flexion 5 minutes    Other Pulley Exercises Standing  UE ranger flex/ ext , CW, CCW x 5 mins      Modalities   Modalities Electrical Stimulation;Vasopneumatic  Oncologist Pre-Mod    Electrical Stimulation Parameters 80-150 hz x10 min    Electrical Stimulation Goals Pain      Vasopneumatic   Number Minutes Vasopneumatic  10 minutes    Vasopnuematic Location  Shoulder    Vasopneumatic Pressure Low    Vasopneumatic Temperature  34 for pain and edema                  PT Education - 09/27/20 1200    Education Details HEP- resisted ER/IR/extension, SL ER AROM; electrical stimulation procdure at home    Person(s) Educated Patient    Methods Explanation;Demonstration;Handout    Comprehension Verbalized understanding;Returned demonstration               PT Long Term Goals - 09/06/20 1149      PT LONG TERM GOAL #1   Title Patient will be  independent with HEP and its progression.    Time 6    Period Weeks    Status Partially Met      PT LONG TERM GOAL #2   Title Patient will demonstrate 140+ degrees of right shoulder flexion AROM to improve overhead activities.    Time 6    Period Weeks    Status On-going      PT LONG TERM GOAL #3   Title Patient will demonstrate 60+ degrees of right shoulder ER AROM to improve donning/doffing apparel.    Time 6    Period Weeks    Status On-going      PT LONG TERM GOAL #4   Title Patient will demonstrate 4/5 or greater right shoulder MMT to improve stability during functional tasks.    Time 6    Period Weeks    Status On-going      PT LONG TERM GOAL #5   Title Patient will report ability to perform ADLs and home activities independently with right shoulder pain less than or equal to 3/10.    Time 6    Period Weeks    Status On-going                 Plan - 09/27/20 1212    Clinical Impression Statement Patient presented in clinic with progression to light resistance and AROM of R shoulder. Patient required multimodal cueing throughout therex to provide instruction of proper technique. Education provided for electrical stimulation procedure as patient purchased home TENS unit. Patient also provided new HEP for light strengthening HEP along with yellow theraband. Normal modalities response noted following removal of the modalities.    Personal Factors and Comorbidities Comorbidity 2    Comorbidities R shoulder RTC repair DCE, subacromial decompression, and biceps tenodesis 07/27/2020; HTN    Examination-Activity Limitations Bathing;Locomotion Level;Transfers;Dressing;Hygiene/Grooming;Toileting    Examination-Participation Restrictions Cleaning;Meal Prep    Stability/Clinical Decision Making Stable/Uncomplicated    Rehab Potential Good    PT Frequency 3x / week    PT Duration 6 weeks    PT Treatment/Interventions ADLs/Self Care Home Management;Cryotherapy;Electrical  Stimulation;Iontophoresis 64m/ml Dexamethasone;Moist Heat;Ultrasound;Therapeutic activities;Therapeutic exercise;Manual techniques;Passive range of motion;Scar mobilization;Patient/family education;Vasopneumatic Device;Taping    PT Next Visit Plan begin supine wand exercises per tolerance; cont AAROM; PROM to right shoulder modalities PRN for pain relief   8 weeks post-op 09-21-20    PT Home Exercise Plan see patient education    Consulted and Agree with Plan of Care Patient  Patient will benefit from skilled therapeutic intervention in order to improve the following deficits and impairments:  Decreased activity tolerance,Decreased strength,Increased edema,Postural dysfunction,Pain,Impaired UE functional use,Decreased range of motion  Visit Diagnosis: Acute pain of right shoulder  Stiffness of right shoulder, not elsewhere classified  Localized edema  Muscle weakness (generalized)     Problem List Patient Active Problem List   Diagnosis Date Noted  . Spinal stenosis of cervical region 04/11/2020  . Vitamin D deficiency 12/12/2014  . Migraines 10/15/2012  . Essential hypertension, benign 10/15/2012  . Hyperlipidemia with target LDL less than 100 10/15/2012    Standley Brooking, PTA 09/27/2020, 12:20 PM  [facility] [address] [place], [place], [address] Phone: [phone]   Fax:  [phone]  Name: [name] MRN: [id number] Date of Birth: [birth year]

## 2020-10-02 ENCOUNTER — Ambulatory Visit: Payer: Medicare Other | Admitting: Physical Therapy

## 2020-10-02 ENCOUNTER — Other Ambulatory Visit: Payer: Self-pay

## 2020-10-02 DIAGNOSIS — R600 Localized edema: Secondary | ICD-10-CM

## 2020-10-02 DIAGNOSIS — M25511 Pain in right shoulder: Secondary | ICD-10-CM

## 2020-10-02 DIAGNOSIS — M25611 Stiffness of right shoulder, not elsewhere classified: Secondary | ICD-10-CM

## 2020-10-02 DIAGNOSIS — M6281 Muscle weakness (generalized): Secondary | ICD-10-CM

## 2020-10-02 NOTE — Therapy (Signed)
[facility] [place], [place], [address] Phone: [phone]   Fax:  [phone]  Physical Therapy Treatment  Patient Details  Name: [name] MRN: [id number] Date of Birth: 02-Dec-1960 Referring Provider (PT): Edmonia Lynch, MD   Encounter Date: 10/02/2020   PT End of Session - 10/02/20 1105    Visit Number 19    Number of Visits 24    Date for PT Re-Evaluation 11/03/20    Authorization Type Medicare (CQ modifier and KX modifier at 15th visit) FOTO; Progress note every 10th visit.  FOTO 10th visit  59%    PT Start Time 1033    PT Stop Time 1117    PT Time Calculation (min) 44 min    Activity Tolerance Patient tolerated treatment well    Behavior During Therapy WFL for tasks assessed/performed           Past Medical History:  Diagnosis Date  . HTN (hypertension)   . Hyperlipidemia   . Migraines     Past Surgical History:  Procedure Laterality Date  . CHOLECYSTECTOMY    . TUBAL LIGATION      There were no vitals filed for this visit.       [facility] PT Assessment - 10/02/20 0001      Assessment   Medical Diagnosis Right shoulder arthroscopic extensive debridement, distal clavicle excision, subacromial decompression, RTC repair of the subscaularis and supraspinatus, biceps tenodesis    Referring Provider (PT) Edmonia Lynch, MD    Onset Date/Surgical Date 07/27/20    Hand Dominance Right    Next MD Visit 10/09/2020    Prior Therapy no      Precautions   Precautions Shoulder    Precaution Comments per RTC protocol                         [facility] Adult PT Treatment/Exercise - 10/02/20 0001      Shoulder Exercises: Prone   Retraction AROM;Right;20 reps    Extension AROM;Right;20 reps    Horizontal ABduction 1 AROM;Right;20 reps      Shoulder Exercises: Standing   Protraction Strengthening;Right;20 reps;Theraband    Theraband Level (Shoulder Protraction) Level 1 (Yellow)    External  Rotation Strengthening;Right;20 reps;Theraband    Theraband Level (Shoulder External Rotation) Level 1 (Yellow)    Internal Rotation Strengthening;Right;20 reps;Theraband    Theraband Level (Shoulder Internal Rotation) Level 1 (Yellow)    Extension Strengthening;Right;20 reps;Theraband    Theraband Level (Shoulder Extension) Level 1 (Yellow)    Row Strengthening;Right;20 reps;Theraband    Theraband Level (Shoulder Row) Level 1 (Yellow)    Other Standing Exercises wall slides into flexion x10 reos      Shoulder Exercises: ROM/Strengthening   UBE (Upper Arm Bike) 120 RPM x6 min    Wall Wash CW/CCW circles x20 reps      Modalities   Modalities Vasopneumatic      Vasopneumatic   Number Minutes Vasopneumatic  10 minutes    Vasopnuematic Location  Shoulder    Vasopneumatic Pressure Low    Vasopneumatic Temperature  34 for pain and edema                       PT Long Term Goals - 09/06/20 1149      PT LONG TERM GOAL #1   Title Patient will be independent with HEP and its progression.    Time 6    Period Weeks  Status Partially Met      PT LONG TERM GOAL #2   Title Patient will demonstrate 140+ degrees of right shoulder flexion AROM to improve overhead activities.    Time 6    Period Weeks    Status On-going      PT LONG TERM GOAL #3   Title Patient will demonstrate 60+ degrees of right shoulder ER AROM to improve donning/doffing apparel.    Time 6    Period Weeks    Status On-going      PT LONG TERM GOAL #4   Title Patient will demonstrate 4/5 or greater right shoulder MMT to improve stability during functional tasks.    Time 6    Period Weeks    Status On-going      PT LONG TERM GOAL #5   Title Patient will report ability to perform ADLs and home activities independently with right shoulder pain less than or equal to 3/10.    Time 6    Period Weeks    Status On-going                 Plan - 10/02/20 1218    Clinical Impression Statement  Patient presented in clinic with reports of increased pain since beginning new HEP. Patient guided through therex with instruction to only limit within a painfree range. Patient thinks that that may have been the cause of her pain as she may have exceeded comfort range. Patient able to tolerate therex well without complaint of pain. Normal vasopneumatic response noted following removal of the modality.    Personal Factors and Comorbidities Comorbidity 2    Comorbidities R shoulder RTC repair DCE, subacromial decompression, and biceps tenodesis 07/27/2020; HTN    Examination-Activity Limitations Bathing;Locomotion Level;Transfers;Dressing;Hygiene/Grooming;Toileting    Examination-Participation Restrictions Cleaning;Meal Prep    Stability/Clinical Decision Making Stable/Uncomplicated    Rehab Potential Good    PT Frequency 3x / week    PT Duration 6 weeks    PT Treatment/Interventions ADLs/Self Care Home Management;Cryotherapy;Electrical Stimulation;Iontophoresis 50m/ml Dexamethasone;Moist Heat;Ultrasound;Therapeutic activities;Therapeutic exercise;Manual techniques;Passive range of motion;Scar mobilization;Patient/family education;Vasopneumatic Device;Taping    PT Next Visit Plan begin supine wand exercises per tolerance; cont AAROM; PROM to right shoulder modalities PRN for pain relief   8 weeks post-op 09-21-20    PT Home Exercise Plan see patient education    Consulted and Agree with Plan of Care Patient           Patient will benefit from skilled therapeutic intervention in order to improve the following deficits and impairments:  Decreased activity tolerance,Decreased strength,Increased edema,Postural dysfunction,Pain,Impaired UE functional use,Decreased range of motion  Visit Diagnosis: Acute pain of right shoulder  Stiffness of right shoulder, not elsewhere classified  Localized edema  Muscle weakness (generalized)     Problem List Patient Active Problem List   Diagnosis Date  Noted  . Spinal stenosis of cervical region 04/11/2020  . Vitamin D deficiency 12/12/2014  . Migraines 10/15/2012  . Essential hypertension, benign 10/15/2012  . Hyperlipidemia with target LDL less than 100 10/15/2012    KStandley Brooking PTA 10/02/2020, 12:21 PM  [facility] [address]Phone: [phone]  Fax:  [phone] Name: MMICHAIAH MAIDENMRN: [id number] of Birth: [birth year]

## 2020-10-04 ENCOUNTER — Ambulatory Visit: Payer: Medicare Other | Admitting: Physical Therapy

## 2020-10-04 ENCOUNTER — Encounter: Payer: Self-pay | Admitting: Physical Therapy

## 2020-10-04 ENCOUNTER — Other Ambulatory Visit: Payer: Self-pay

## 2020-10-04 DIAGNOSIS — M6281 Muscle weakness (generalized): Secondary | ICD-10-CM

## 2020-10-04 DIAGNOSIS — M25611 Stiffness of right shoulder, not elsewhere classified: Secondary | ICD-10-CM

## 2020-10-04 DIAGNOSIS — M25511 Pain in right shoulder: Secondary | ICD-10-CM

## 2020-10-04 DIAGNOSIS — R600 Localized edema: Secondary | ICD-10-CM

## 2020-10-04 NOTE — Therapy (Signed)
[facility] [address] [place], [place], [address] Phone: [phone]   Fax:  [phone]  Physical Therapy Treatment  Patient Details  Name: [name] MRN: [id number] Date of Birth: May 30, 1961 Referring Provider (PT): Margarita Rana, MD   Encounter Date: 10/04/2020   PT End of Session - 10/04/20 1100    Visit Number 20    Number of Visits 24    Date for PT Re-Evaluation 11/03/20    Authorization Type Medicare (CQ modifier and KX modifier at 15th visit) FOTO; Progress note every 10th visit.  FOTO 10th visit  59%    PT Start Time 1032    PT Stop Time 1115    PT Time Calculation (min) 43 min    Activity Tolerance Patient tolerated treatment well    Behavior During Therapy WFL for tasks assessed/performed           Past Medical History:  Diagnosis Date  . HTN (hypertension)   . Hyperlipidemia   . Migraines     Past Surgical History:  Procedure Laterality Date  . CHOLECYSTECTOMY    . TUBAL LIGATION      There were no vitals filed for this visit.   Subjective Assessment - 10/04/20 1038    Subjective COVID-19 screening performed upon arrival. Reports a popping along her mid back this morning while showering.    Pertinent History R shoulder RTC repair, DCE, SAD, and biceps tenodesis 07/27/2020, HTN    Limitations House hold activities    Diagnostic tests MRI    Patient Stated Goals use arm normally again    Currently in Pain? Other (Comment)   No pain rating provided             [facility] PT Assessment - 10/04/20 0001      Assessment   Medical Diagnosis Right shoulder arthroscopic extensive debridement, distal clavicle excision, subacromial decompression, RTC repair of the subscaularis and supraspinatus, biceps tenodesis    Referring Provider (PT) Margarita Rana, MD    Onset Date/Surgical Date 07/27/20    Hand Dominance Right    Next MD Visit 10/09/2020    Prior Therapy no      Precautions   Precautions  Shoulder    Precaution Comments per RTC protocol      Observation/Other Assessments   Focus on Therapeutic Outcomes (FOTO)  36% limitation 20th visit      ROM / Strength   AROM / PROM / Strength AROM      AROM   Overall AROM  Within functional limits for tasks performed    AROM Assessment Site Shoulder    Right/Left Shoulder Right    Right Shoulder Flexion 155 Degrees    Right Shoulder Internal Rotation 80 Degrees    Right Shoulder External Rotation 85 Degrees                         [facility] Adult PT Treatment/Exercise - 10/04/20 0001      Shoulder Exercises: Standing   Protraction Strengthening;Right;20 reps;Theraband    Theraband Level (Shoulder Protraction) Level 1 (Yellow)    External Rotation Strengthening;Right;20 reps;Theraband    Theraband Level (Shoulder External Rotation) Level 1 (Yellow)    Internal Rotation Strengthening;Right;20 reps;Theraband    Theraband Level (Shoulder Internal Rotation) Level 1 (Yellow)    Extension Strengthening;Right;20 reps;Theraband    Theraband Level (Shoulder Extension) Level 1 (Yellow)    Row Strengthening;Right;20 reps;Theraband    Theraband Level (Shoulder Row) Level 1 (  Yellow)      Shoulder Exercises: ROM/Strengthening   UBE (Upper Arm Bike) 120 RPM x8 min    Wall Wash flex/CW/CCW circles x20 reps   popping in R shoulder joint with circles   Other ROM/Strengthening Exercises RUE wall clock x5 reps      Modalities   Modalities Electrical Stimulation;Moist Heat      Moist Heat Therapy   Number Minutes Moist Heat 15 Minutes    Moist Heat Location Shoulder      Electrical Stimulation   Electrical Stimulation Location R shoulder    Electrical Stimulation Action Pre-Mod    Electrical Stimulation Parameters 80-150 hz x15 min    Electrical Stimulation Goals Pain      Manual Therapy   Manual Therapy Passive ROM    Passive ROM PROM of R shoulder  ER, IR with holds at end range.                       PT  Long Term Goals - 10/04/20 1059      PT LONG TERM GOAL #1   Title Patient will be independent with HEP and its progression.    Time 6    Period Weeks    Status Achieved      PT LONG TERM GOAL #2   Title Patient will demonstrate 140+ degrees of right shoulder flexion AROM to improve overhead activities.    Time 6    Period Weeks    Status Achieved      PT LONG TERM GOAL #3   Title Patient will demonstrate 60+ degrees of right shoulder ER AROM to improve donning/doffing apparel.    Time 6    Period Weeks    Status Achieved      PT LONG TERM GOAL #4   Title Patient will demonstrate 4/5 or greater right shoulder MMT to improve stability during functional tasks.    Time 6    Period Weeks    Status On-going      PT LONG TERM GOAL #5   Title Patient will report ability to perform ADLs and home activities independently with right shoulder pain less than or equal to 3/10.    Time 6    Period Weeks    Status Achieved                 Plan - 10/04/20 1217    Clinical Impression Statement Patient continues to demonstrate great R shoulder ROM. Patient has recently been introduced to light strengthening with patient reporting intermittant popping of posterior R shoulder and surrounding R scapula. Patient able to tolerate progression into functional training and strengthening fairly well. All AROM measurements provided in today's note with firm end feels and smooth arc of motion. Patient has TENS unit for use at home and has been educated on set up and parameters for safety. Normal modalities response noted following removal of the modalities.    Personal Factors and Comorbidities Comorbidity 2    Comorbidities R shoulder RTC repair DCE, subacromial decompression, and biceps tenodesis 07/27/2020; HTN    Examination-Activity Limitations Bathing;Locomotion Level;Transfers;Dressing;Hygiene/Grooming;Toileting    Examination-Participation Restrictions Cleaning;Meal Prep    Stability/Clinical  Decision Making Stable/Uncomplicated    Rehab Potential Good    PT Frequency 3x / week    PT Duration 6 weeks    PT Treatment/Interventions ADLs/Self Care Home Management;Cryotherapy;Electrical Stimulation;Iontophoresis 4mg /ml Dexamethasone;Moist Heat;Ultrasound;Therapeutic activities;Therapeutic exercise;Manual techniques;Passive range of motion;Scar mobilization;Patient/family education;Vasopneumatic Device;Taping    PT Next Visit  Plan Continue per protocol.    PT Home Exercise Plan see patient education    Consulted and Agree with Plan of Care Patient           Patient will benefit from skilled therapeutic intervention in order to improve the following deficits and impairments:  Decreased activity tolerance,Decreased strength,Increased edema,Postural dysfunction,Pain,Impaired UE functional use,Decreased range of motion  Visit Diagnosis: Acute pain of right shoulder  Stiffness of right shoulder, not elsewhere classified  Localized edema  Muscle weakness (generalized)     Problem List Patient Active Problem List   Diagnosis Date Noted  . Spinal stenosis of cervical region 04/11/2020  . Vitamin D deficiency 12/12/2014  . Migraines 10/15/2012  . Essential hypertension, benign 10/15/2012  . Hyperlipidemia with target LDL less than 100 10/15/2012    Marvell Fuller, PTA 10/04/20 12:22 PM   [facility] [address] [place], [place], [address] Phone: [phone]   Fax:  [phone]  Name: [name] MRN: [id number] Date of Birth: 28-Sep-1960

## 2020-11-01 ENCOUNTER — Ambulatory Visit (INDEPENDENT_AMBULATORY_CARE_PROVIDER_SITE_OTHER): Payer: Medicare Other

## 2020-11-01 VITALS — Ht 60.0 in | Wt 135.0 lb

## 2020-11-01 DIAGNOSIS — Z1231 Encounter for screening mammogram for malignant neoplasm of breast: Secondary | ICD-10-CM | POA: Diagnosis not present

## 2020-11-01 DIAGNOSIS — Z1211 Encounter for screening for malignant neoplasm of colon: Secondary | ICD-10-CM | POA: Diagnosis not present

## 2020-11-01 DIAGNOSIS — Z0000 Encounter for general adult medical examination without abnormal findings: Secondary | ICD-10-CM

## 2020-11-01 NOTE — Patient Instructions (Signed)
[name] , Thank you for taking time to come for your Medicare Wellness Visit. I appreciate your ongoing commitment to your health goals. Please review the following plan we discussed and let me know if I can assist you in the future.   Screening recommendations/referrals: Colonoscopy: Ordered cologuard today Mammogram: Done 08/25/2019 - Repeat annually (ordered today) Bone Density: Due at age 60 Recommended yearly ophthalmology/optometry visit for glaucoma screening and checkup Recommended yearly dental visit for hygiene and checkup  Vaccinations: Influenza vaccine: Declined Pneumococcal vaccine: *DUE Tdap vaccine: Done 10/28/2007 (repeat every 10 years) *DUE Shingles vaccine: *DUE  Covid-19: Done 10/17/2019 & 12/28/2019 (DUE* for booster)  Advanced directives: Advance directive discussed with you today. I have provided a copy for you to complete at home and have notarized. Once this is complete please bring a copy in to our office so we can scan it into your chart.  Conditions/risks identified: Aim for 30 minutes of exercise or brisk walking each day, drink 6-8 glasses of water and eat lots of fruits and vegetables.  Next appointment: Follow up in one year for your annual wellness visit.   Preventive Care 40-64 Years, Female Preventive care refers to lifestyle choices and visits with your health care provider that can promote health and wellness. What does preventive care include?  A yearly physical exam. This is also called an annual well check.  Dental exams once or twice a year.  Routine eye exams. Ask your health care provider how often you should have your eyes checked.  Personal lifestyle choices, including:  Daily care of your teeth and gums.  Regular physical activity.  Eating a healthy diet.  Avoiding tobacco and drug use.  Limiting alcohol use.  Practicing safe sex.  Taking low-dose aspirin daily starting at age 59.  Taking vitamin and mineral supplements as  recommended by your health care provider. What happens during an annual well check? The services and screenings done by your health care provider during your annual well check will depend on your age, overall health, lifestyle risk factors, and family history of disease. Counseling  Your health care provider may ask you questions about your:  Alcohol use.  Tobacco use.  Drug use.  Emotional well-being.  Home and relationship well-being.  Sexual activity.  Eating habits.  Work and work Statistician.  Method of birth control.  Menstrual cycle.  Pregnancy history. Screening  You may have the following tests or measurements:  Height, weight, and BMI.  Blood pressure.  Lipid and cholesterol levels. These may be checked every 5 years, or more frequently if you are over 94 years old.  Skin check.  Lung cancer screening. You may have this screening every year starting at age 34 if you have a 30-pack-year history of smoking and currently smoke or have quit within the past 15 years.  Fecal occult blood test (FOBT) of the stool. You may have this test every year starting at age 85.  Flexible sigmoidoscopy or colonoscopy. You may have a sigmoidoscopy every 5 years or a colonoscopy every 10 years starting at age 45.  Hepatitis C blood test.  Hepatitis B blood test.  Sexually transmitted disease (STD) testing.  Diabetes screening. This is done by checking your blood sugar (glucose) after you have not eaten for a while (fasting). You may have this done every 1-3 years.  Mammogram. This may be done every 1-2 years. Talk to your health care provider about when you should start having regular mammograms. This may depend  on whether you have a family history of breast cancer.  BRCA-related cancer screening. This may be done if you have a family history of breast, ovarian, tubal, or peritoneal cancers.  Pelvic exam and Pap test. This may be done every 3 years starting at age 23.  Starting at age 76, this may be done every 5 years if you have a Pap test in combination with an HPV test.  Bone density scan. This is done to screen for osteoporosis. You may have this scan if you are at high risk for osteoporosis. Discuss your test results, treatment options, and if necessary, the need for more tests with your health care provider. Vaccines  Your health care provider may recommend certain vaccines, such as:  Influenza vaccine. This is recommended every year.  Tetanus, diphtheria, and acellular pertussis (Tdap, Td) vaccine. You may need a Td booster every 10 years.  Zoster vaccine. You may need this after age 79.  Pneumococcal 13-valent conjugate (PCV13) vaccine. You may need this if you have certain conditions and were not previously vaccinated.  Pneumococcal polysaccharide (PPSV23) vaccine. You may need one or two doses if you smoke cigarettes or if you have certain conditions. Talk to your health care provider about which screenings and vaccines you need and how often you need them. This information is not intended to replace advice given to you by your health care provider. Make sure you discuss any questions you have with your health care provider. Document Released: 07/21/2015 Document Revised: 03/13/2016 Document Reviewed: 04/25/2015 Elsevier Interactive Patient Education  2017 [employer] Prevention in the Home Falls can cause injuries. They can happen to people of all ages. There are many things you can do to make your home safe and to help prevent falls. What can I do on the outside of my home?  Regularly fix the edges of walkways and driveways and fix any cracks.  Remove anything that might make you trip as you walk through a door, such as a raised step or threshold.  Trim any bushes or trees on the path to your home.  Use bright outdoor lighting.  Clear any walking paths of anything that might make someone trip, such as rocks or  tools.  Regularly check to see if handrails are loose or broken. Make sure that both sides of any steps have handrails.  Any raised decks and porches should have guardrails on the edges.  Have any leaves, snow, or ice cleared regularly.  Use sand or salt on walking paths during winter.  Clean up any spills in your garage right away. This includes oil or grease spills. What can I do in the bathroom?  Use night lights.  Install grab bars by the toilet and in the tub and shower. Do not use towel bars as grab bars.  Use non-skid mats or decals in the tub or shower.  If you need to sit down in the shower, use a plastic, non-slip stool.  Keep the floor dry. Clean up any water that spills on the floor as soon as it happens.  Remove soap buildup in the tub or shower regularly.  Attach bath mats securely with double-sided non-slip rug tape.  Do not have throw rugs and other things on the floor that can make you trip. What can I do in the bedroom?  Use night lights.  Make sure that you have a light by your bed that is easy to reach.  Do not use any  sheets or blankets that are too big for your bed. They should not hang down onto the floor.  Have a firm chair that has side arms. You can use this for support while you get dressed.  Do not have throw rugs and other things on the floor that can make you trip. What can I do in the kitchen?  Clean up any spills right away.  Avoid walking on wet floors.  Keep items that you use a lot in easy-to-reach places.  If you need to reach something above you, use a strong step stool that has a grab bar.  Keep electrical cords out of the way.  Do not use floor polish or wax that makes floors slippery. If you must use wax, use non-skid floor wax.  Do not have throw rugs and other things on the floor that can make you trip. What can I do with my stairs?  Do not leave any items on the stairs.  Make sure that there are handrails on both  sides of the stairs and use them. Fix handrails that are broken or loose. Make sure that handrails are as long as the stairways.  Check any carpeting to make sure that it is firmly attached to the stairs. Fix any carpet that is loose or worn.  Avoid having throw rugs at the top or bottom of the stairs. If you do have throw rugs, attach them to the floor with carpet tape.  Make sure that you have a light switch at the top of the stairs and the bottom of the stairs. If you do not have them, ask someone to add them for you. What else can I do to help prevent falls?  Wear shoes that:  Do not have high heels.  Have rubber bottoms.  Are comfortable and fit you well.  Are closed at the toe. Do not wear sandals.  If you use a stepladder:  Make sure that it is fully opened. Do not climb a closed stepladder.  Make sure that both sides of the stepladder are locked into place.  Ask someone to hold it for you, if possible.  Clearly mark and make sure that you can see:  Any grab bars or handrails.  First and last steps.  Where the edge of each step is.  Use tools that help you move around (mobility aids) if they are needed. These include:  Canes.  Walkers.  Scooters.  Crutches.  Turn on the lights when you go into a dark area. Replace any light bulbs as soon as they burn out.  Set up your furniture so you have a clear path. Avoid moving your furniture around.  If any of your floors are uneven, fix them.  If there are any pets around you, be aware of where they are.  Review your medicines with your doctor. Some medicines can make you feel dizzy. This can increase your chance of falling. Ask your doctor what other things that you can do to help prevent falls. This information is not intended to replace advice given to you by your health care provider. Make sure you discuss any questions you have with your health care provider. Document Released: 04/20/2009 Document Revised:  11/30/2015 Document Reviewed: 07/29/2014 Elsevier Interactive Patient Education  2017 [employer].

## 2020-11-01 NOTE — Progress Notes (Addendum)
Subjective:   [name] is a 60 y.o. female who presents for an Initial Medicare Annual Wellness Visit.  Virtual Visit via Telephone Note  I connected with  [name] on 11/01/20 at  3:30 PM EDT by telephone and verified that I am speaking with the correct person using two identifiers.  Location: Patient: Home Provider: [facility] Persons participating in the virtual visit: patient/Nurse Health Advisor   I discussed the limitations, risks, security and privacy concerns of performing an evaluation and management service by telephone and the availability of in person appointments. The patient expressed understanding and agreed to proceed.  Interactive audio and video telecommunications were attempted between this nurse and patient, however failed, due to patient having technical difficulties OR patient did not have access to video capability.  We continued and completed visit with audio only.  Some vital signs may be absent or patient reported.   Malene Blaydes E Ruhani Umland, LPN   Review of Systems     Cardiac Risk Factors include: dyslipidemia;hypertension;smoking/ tobacco exposure     Objective:    Today's Vitals   11/01/20 1440  Weight: 135 lb (61.2 kg)  Height: 5' (1.524 m)  PainSc: 5    Body mass index is 26.37 kg/m.  Advanced Directives 11/01/2020 08/08/2020  Does Patient Have a Medical Advance Directive? No No  Would patient like information on creating a medical advance directive? Yes (MAU/Ambulatory/Procedural Areas - Information given) -    Current Medications (verified) Outpatient Encounter Medications as of 11/01/2020  Medication Sig  . aspirin 81 MG chewable tablet Chew 81 mg by mouth daily.  Marland Kitchen atorvastatin (LIPITOR) 40 MG tablet Take 1 tablet (40 mg total) by mouth daily.  . fenofibrate (TRICOR) 145 MG tablet Take 1 tablet (145 mg total) by mouth daily.  . hydrochlorothiazide (HYDRODIURIL) 25 MG tablet Take 1 tablet (25 mg total) by mouth daily.  Marland Kitchen ibuprofen  (ADVIL) 200 MG tablet Take 800 mg by mouth every 8 (eight) hours as needed.  . traMADol (ULTRAM) 50 MG tablet Take 50 mg by mouth every 8 (eight) hours as needed.  . zolpidem (AMBIEN) 10 MG tablet Take 1 tablet (10 mg total) by mouth at bedtime as needed. for sleep  . [DISCONTINUED] BAYER ASPIRIN EC LOW DOSE 81 MG EC tablet Take 81 mg by mouth 2 (two) times daily.   No facility-administered encounter medications on file as of 11/01/2020.    Allergies (verified) Baclofen   History: Past Medical History:  Diagnosis Date  . HTN (hypertension)   . Hyperlipidemia   . Migraines    Past Surgical History:  Procedure Laterality Date  . CHOLECYSTECTOMY    . TUBAL LIGATION     Family History  Problem Relation Age of Onset  . Diabetes Mother   . Heart disease Father   . Diabetes Father   . Healthy Sister   . Healthy Brother   . Healthy Sister    Social History   Socioeconomic History  . Marital status: Divorced    Spouse name: Not on file  . Number of children: 3  . Years of education: Not on file  . Highest education level: Not on file  Occupational History  . Occupation: disability  Tobacco Use  . Smoking status: Current Every Day Smoker    Packs/day: 0.50    Types: Cigarettes    Start date: 10/02/1983  . Smokeless tobacco: Never Used  Vaping Use  . Vaping Use: Never used  Substance and Sexual Activity  .  Alcohol use: No  . Drug use: No  . Sexual activity: Not on file  Other Topics Concern  . Not on file  Social History Narrative   Lives alone, one child in [place], one in [place], son in [place]; parents live 2 miles away   Social Determinants of Health   Financial Resource Strain: High Risk  . Difficulty of Paying Living Expenses: Hard  Food Insecurity: Food Insecurity Present  . Worried About Charity fundraiser in the Last Year: Sometimes true  . Ran Out of Food in the Last Year: Sometimes true  Transportation Needs: No Transportation Needs  . Lack of  Transportation (Medical): No  . Lack of Transportation (Non-Medical): No  Physical Activity: Sufficiently Active  . Days of Exercise per Week: 7 days  . Minutes of Exercise per Session: 60 min  Stress: No Stress Concern Present  . Feeling of Stress : Not at all  Social Connections: Socially Isolated  . Frequency of Communication with Friends and Family: More than three times a week  . Frequency of Social Gatherings with Friends and Family: More than three times a week  . Attends Religious Services: Never  . Active Member of Clubs or Organizations: No  . Attends Archivist Meetings: Never  . Marital Status: Divorced    Tobacco Counseling Ready to quit: No Counseling given: Yes   Clinical Intake:  Pre-visit preparation completed: Yes  Pain : 0-10 Pain Score: 5  Pain Type: Chronic pain Pain Location: Generalized Pain Descriptors / Indicators: Aching Pain Onset: More than a month ago Pain Frequency: Intermittent     BMI - recorded: 26.37 Nutritional Status: BMI 25 -29 Overweight Nutritional Risks: None Diabetes: No  How often do you need to have someone help you when you read instructions, pamphlets, or other written materials from your doctor or pharmacy?: 1 - Never  Diabetic? No  Interpreter Needed?: No  Information entered by :: Mylani Gentry, LPN   Activities of Daily Living In your present state of health, do you have any difficulty performing the following activities: 11/01/2020 05/18/2020  Hearing? N N  Vision? N N  Difficulty concentrating or making decisions? Y N  Walking or climbing stairs? Y N  Dressing or bathing? N N  Doing errands, shopping? N N  Preparing Food and eating ? N -  Using the Toilet? N -  In the past six months, have you accidently leaked urine? N -  Do you have problems with loss of bowel control? N -  Managing your Medications? N -  Managing your Finances? N -  Housekeeping or managing your Housekeeping? N -  Some recent  data might be hidden    Patient Care Team: Chevis Pretty, FNP as PCP - General (Family Medicine)  Indicate any recent Medical Services you may have received from other than [facility] providers in the past year (date may be approximate).     Assessment:   This is a routine wellness examination for [place].  Hearing/Vision screen  Hearing Screening   _0  _1  _2  _3  _4  _5  _6  _7  _8   Right ear:           Left ear:           Comments: Denies hearing difficulties   Vision Screening Comments: Annual visits with Dr Marin Comment - up to date with eye exam - uses glasses for reading only  Dietary issues and exercise activities discussed: Current Exercise Habits: Home exercise routine, Type of exercise: strength  training/weights;walking, Time (Minutes): 60, Frequency (Times/Week): 5, Weekly Exercise (Minutes/Week): 300, Intensity: Moderate, Exercise limited by: orthopedic condition(s)  Goals    . Exercise 3x per week (30 min per time)     Be more active Wants to be pain free by next year  Goals Addressed             This Visit's Progress   . Exercise 3x per week (30 min per time)   On track    Be more active Wants to be pain free by next year                Depression Screen PHQ 2/9 Scores 11/01/2020 05/18/2020 04/11/2020 02/16/2020 08/13/2019 08/29/2018 03/31/2018  PHQ - 2 Score 0 0 0 0 0 0 0  PHQ- 9 Score - - - - - - -    Fall Risk Fall Risk  11/01/2020 05/18/2020 02/16/2020 08/13/2019 08/29/2018  Falls in the past year? 0 0 0 0 1  Number falls in past yr: 0 - - - 0  Injury with Fall? 0 - - - 0  Risk for fall due to : Orthopedic patient;Impaired balance/gait - - - -  Follow up Falls prevention discussed - - - -    FALL RISK PREVENTION PERTAINING TO THE HOME:  Any stairs in or around the home? Yes  If so, are there any without handrails? No  Home free of loose throw rugs in walkways, pet beds, electrical cords, etc? Yes  Adequate lighting in your  home to reduce risk of falls? Yes   ASSISTIVE DEVICES UTILIZED TO PREVENT FALLS:  Life alert? No  Use of a cane, walker or w/c? No  Grab bars in the bathroom? Yes  Shower chair or bench in shower? Yes  Elevated toilet seat or a handicapped toilet? No   TIMED UP AND GO:  Was the test performed? No .  Telephonic visit  Cognitive Function:Normal cognitive status assessed by direct observation by this Nurse Health Advisor. No abnormalities found.          Immunizations Immunization History  Administered Date(s) Administered  . Hepatitis A 09/15/2008, 10/17/2008  . Hepatitis B 09/15/2008, 10/17/2008  . MMR 07/29/2008  . Moderna Sars-Covid-2 Vaccination 10/17/2019, 01/04/2020  . Td 10/28/2007  . Tdap 10/28/2007    TDAP status: Due, Education has been provided regarding the importance of this vaccine. Advised may receive this vaccine at local pharmacy or Health Dept. Aware to provide a copy of the vaccination record if obtained from local pharmacy or Health Dept. Verbalized acceptance and understanding.  Flu Vaccine status: Declined, Education has been provided regarding the importance of this vaccine but patient still declined. Advised may receive this vaccine at local pharmacy or Health Dept. Aware to provide a copy of the vaccination record if obtained from local pharmacy or Health Dept. Verbalized acceptance and understanding.  Pneumococcal vaccine status: Due, Education has been provided regarding the importance of this vaccine. Advised may receive this vaccine at local pharmacy or Health Dept. Aware to provide a copy of the vaccination record if obtained from local pharmacy or Health Dept. Verbalized acceptance and understanding.  Covid-19 vaccine status: Completed vaccines  Qualifies for Shingles Vaccine? Yes   Zostavax completed No   Shingrix Completed?: No.    Education has been provided regarding the importance of this vaccine. Patient has been advised to call insurance  company to determine out of pocket expense if they have not yet received this vaccine. Advised may also  receive vaccine at local pharmacy or Health Dept. Verbalized acceptance and understanding.  Screening Tests Health Maintenance  Topic Date Due  . COVID-19 Vaccine (3 - Booster for Moderna series) 07/05/2020  . Hepatitis C Screening  02/15/2021 (Originally [birth year])  . COLONOSCOPY (Pts 45-92yr Insurance coverage will need to be confirmed)  05/18/2021 (Originally 12/24/2006)  . INFLUENZA VACCINE  02/05/2021  . MAMMOGRAM  08/16/2021  . PAP SMEAR-Modifier  08/12/2022  . TETANUS/TDAP  12/08/2024  . HIV Screening  Completed  . HPV VACCINES  Aged Out    Health Maintenance  Health Maintenance Due  Topic Date Due  . COVID-19 Vaccine (3 - Booster for Moderna series) 07/05/2020    Colorectal cancer screening: Declines colonoscopy, but may be willing to do cologuard if insurance will cover.  Mammogram status: Ordered 11/01/2020. Pt provided with contact info and advised to call to schedule appt.   Bone Density status: Ordered 11/01/20. Pt provided with contact info and advised to call to schedule appt.  Lung Cancer Screening: (Low Dose CT Chest recommended if Age 887-80years, 30 pack-year currently smoking OR have quit w/in 15years.) does not qualify.   Additional Screening:  Hepatitis C Screening: does qualify; Needs this drawn with next routine labs  Vision Screening: Recommended annual ophthalmology exams for early detection of glaucoma and other disorders of the eye. Is the patient up to date with their annual eye exam?  Yes  Who is the provider or what is the name of the office in which the patient attends annual eye exams? Dr LMarin CommentIf pt is not established with a provider, would they like to be referred to a provider to establish care? No .   Dental Screening: Recommended annual dental exams for proper oral hygiene  Community Resource Referral / Chronic Care Management: CRR  required this visit?  No   CCM required this visit?  No      Plan:     I have personally reviewed and noted the following in the patient's chart:   . Medical and social history . Use of alcohol, tobacco or illicit drugs  . Current medications and supplements . Functional ability and status . Nutritional status . Physical activity . Advanced directives . List of other physicians . Hospitalizations, surgeries, and ER visits in previous 12 months . Vitals . Screenings to include cognitive, depression, and falls . Referrals and appointments  In addition, I have reviewed and discussed with patient certain preventive protocols, quality metrics, and best practice recommendations. A written personalized care plan for preventive services as well as general preventive health recommendations were provided to patient.     ASandrea Hammond LPN   [id number]  Nurse Notes: None   I have reviewed and agree with the above AWV documentation.  WClaretta Fraise M.D.

## 2020-11-09 ENCOUNTER — Other Ambulatory Visit: Payer: Self-pay

## 2020-11-09 ENCOUNTER — Encounter: Payer: Self-pay | Admitting: Nurse Practitioner

## 2020-11-09 ENCOUNTER — Ambulatory Visit (INDEPENDENT_AMBULATORY_CARE_PROVIDER_SITE_OTHER): Payer: Medicare Other | Admitting: Nurse Practitioner

## 2020-11-09 VITALS — BP 131/91 | HR 74 | Temp 97.9°F | Resp 20 | Ht 60.0 in | Wt 136.0 lb

## 2020-11-09 DIAGNOSIS — G43109 Migraine with aura, not intractable, without status migrainosus: Secondary | ICD-10-CM | POA: Diagnosis not present

## 2020-11-09 DIAGNOSIS — E785 Hyperlipidemia, unspecified: Secondary | ICD-10-CM | POA: Diagnosis not present

## 2020-11-09 DIAGNOSIS — F5101 Primary insomnia: Secondary | ICD-10-CM

## 2020-11-09 DIAGNOSIS — I10 Essential (primary) hypertension: Secondary | ICD-10-CM | POA: Diagnosis not present

## 2020-11-09 LAB — CBC WITH DIFFERENTIAL/PLATELET
Basophils Absolute: 0.1 10*3/uL (ref 0.0–0.2)
Basos: 1 %
EOS (ABSOLUTE): 0.2 10*3/uL (ref 0.0–0.4)
Eos: 3 %
Hematocrit: 47.1 % — ABNORMAL HIGH (ref 34.0–46.6)
Hemoglobin: 15.9 g/dL (ref 11.1–15.9)
Immature Grans (Abs): 0 10*3/uL (ref 0.0–0.1)
Immature Granulocytes: 0 %
Lymphocytes Absolute: 2 10*3/uL (ref 0.7–3.1)
Lymphs: 27 %
MCH: 29.8 pg (ref 26.6–33.0)
MCHC: 33.8 g/dL (ref 31.5–35.7)
MCV: 88 fL (ref 79–97)
Monocytes Absolute: 0.4 10*3/uL (ref 0.1–0.9)
Monocytes: 6 %
Neutrophils Absolute: 4.8 10*3/uL (ref 1.4–7.0)
Neutrophils: 63 %
Platelets: 295 10*3/uL (ref 150–450)
RBC: 5.34 x10E6/uL — ABNORMAL HIGH (ref 3.77–5.28)
RDW: 12.4 % (ref 11.7–15.4)
WBC: 7.6 10*3/uL (ref 3.4–10.8)

## 2020-11-09 LAB — LIPID PANEL
Chol/HDL Ratio: 5 ratio — ABNORMAL HIGH (ref 0.0–4.4)
Cholesterol, Total: 144 mg/dL (ref 100–199)
HDL: 29 mg/dL — ABNORMAL LOW (ref >39)
LDL Chol Calc (NIH): 86 mg/dL (ref 0–99)
Triglycerides: 166 mg/dL — ABNORMAL HIGH (ref 0–149)
VLDL Cholesterol Cal: 29 mg/dL (ref 5–40)

## 2020-11-09 LAB — CMP14+EGFR
ALT: 37 IU/L — ABNORMAL HIGH (ref 0–32)
AST: 32 IU/L (ref 0–40)
Albumin/Globulin Ratio: 2 (ref 1.2–2.2)
Albumin: 4.7 g/dL (ref 3.8–4.9)
Alkaline Phosphatase: 84 IU/L (ref 44–121)
BUN/Creatinine Ratio: 19 (ref 9–23)
BUN: 15 mg/dL (ref 6–24)
Bilirubin Total: 0.2 mg/dL (ref 0.0–1.2)
CO2: 22 mmol/L (ref 20–29)
Calcium: 9.8 mg/dL (ref 8.7–10.2)
Chloride: 103 mmol/L (ref 96–106)
Creatinine, Ser: 0.79 mg/dL (ref 0.57–1.00)
Globulin, Total: 2.3 g/dL (ref 1.5–4.5)
Glucose: 106 mg/dL — ABNORMAL HIGH (ref 65–99)
Potassium: 3.9 mmol/L (ref 3.5–5.2)
Sodium: 141 mmol/L (ref 134–144)
Total Protein: 7 g/dL (ref 6.0–8.5)
eGFR: 87 mL/min/{1.73_m2} (ref >59)

## 2020-11-09 MED ORDER — ATORVASTATIN CALCIUM 40 MG PO TABS
40.0000 mg | ORAL_TABLET | Freq: Every day | ORAL | 1 refills | Status: DC
Start: 2020-11-09 — End: 2021-05-09

## 2020-11-09 MED ORDER — HYDROCHLOROTHIAZIDE 25 MG PO TABS: 25.0000 mg | ORAL_TABLET | Freq: Every day | ORAL | 1 refills | Status: DC

## 2020-11-09 MED ORDER — ZOLPIDEM TARTRATE 10 MG PO TABS: 10.0000 mg | ORAL_TABLET | Freq: Every evening | ORAL | 1 refills | Status: DC | PRN

## 2020-11-09 MED ORDER — FENOFIBRATE 145 MG PO TABS: 145.0000 mg | ORAL_TABLET | Freq: Every day | ORAL | 1 refills | Status: DC

## 2020-11-09 NOTE — Patient Instructions (Signed)
Exercising to Stay Healthy To become healthy and stay healthy, it is recommended that you do moderate-intensity and vigorous-intensity exercise. You can tell that you are exercising at a moderate intensity if your heart starts beating faster and you start breathing faster but can still hold a conversation. You can tell that you are exercising at a vigorous intensity if you are breathing much harder and faster and cannot hold a conversation while exercising. Exercising regularly is important. It has many health benefits, such as:  Improving overall fitness, flexibility, and endurance.  Increasing bone density.  Helping with weight control.  Decreasing body fat.  Increasing muscle strength.  Reducing stress and tension.  Improving overall health. How often should I exercise? Choose an activity that you enjoy, and set realistic goals. Your health care provider can help you make an activity plan that works for you. Exercise regularly as told by your health care provider. This may include:  Doing strength training two times a week, such as: ? Lifting weights. ? Using resistance bands. ? Push-ups. ? Sit-ups. ? Yoga.  Doing a certain intensity of exercise for a given amount of time. Choose from these options: ? A total of 150 minutes of moderate-intensity exercise every week. ? A total of 75 minutes of vigorous-intensity exercise every week. ? A mix of moderate-intensity and vigorous-intensity exercise every week. Children, pregnant women, people who have not exercised regularly, people who are overweight, and older adults may need to talk with a health care provider about what activities are safe to do. If you have a medical condition, be sure to talk with your health care provider before you start a new exercise program. What are some exercise ideas? Moderate-intensity exercise ideas include:  Walking 1 mile (1.6 km) in about 15  minutes.  Biking.  Hiking.  Golfing.  Dancing.  Water aerobics. Vigorous-intensity exercise ideas include:  Walking 4.5 miles (7.2 km) or more in about 1 hour.  Jogging or running 5 miles (8 km) in about 1 hour.  Biking 10 miles (16.1 km) or more in about 1 hour.  Lap swimming.  Roller-skating or in-line skating.  Cross-country skiing.  Vigorous competitive sports, such as football, basketball, and soccer.  Jumping rope.  Aerobic dancing.   What are some everyday activities that can help me to get exercise?  Yard work, such as: ? Pushing a lawn mower. ? Raking and bagging leaves.  Washing your car.  Pushing a stroller.  Shoveling snow.  Gardening.  Washing windows or floors. How can I be more active in my day-to-day activities?  Use stairs instead of an elevator.  Take a walk during your lunch break.  If you drive, park your car farther away from your work or school.  If you take public transportation, get off one stop early and walk the rest of the way.  Stand up or walk around during all of your indoor phone calls.  Get up, stretch, and walk around every 30 minutes throughout the day.  Enjoy exercise with a friend. Support to continue exercising will help you keep a regular routine of activity. What guidelines can I follow while exercising?  Before you start a new exercise program, talk with your health care provider.  Do not exercise so much that you hurt yourself, feel dizzy, or get very short of breath.  Wear comfortable clothes and wear shoes with good support.  Drink plenty of water while you exercise to prevent dehydration or heat stroke.  Work out until   your breathing and your heartbeat get faster. Where to find more information  [place] Department of Health and Human Services: www.hhs.gov  Centers for Disease Control and Prevention (CDC): www.cdc.gov Summary  Exercising regularly is important. It will improve your overall fitness,  flexibility, and endurance.  Regular exercise also will improve your overall health. It can help you control your weight, reduce stress, and improve your bone density.  Do not exercise so much that you hurt yourself, feel dizzy, or get very short of breath.  Before you start a new exercise program, talk with your health care provider. This information is not intended to replace advice given to you by your health care provider. Make sure you discuss any questions you have with your health care provider. Document Revised: 06/06/2017 Document Reviewed: 05/15/2017 Elsevier Patient Education  2021 Elsevier Inc.  

## 2020-11-09 NOTE — Progress Notes (Signed)
Subjective:    Patient ID: [name], female    DOB: 1961-01-14, 59 y.o.   MRN: [id number]   Chief Complaint: Medical Management of Chronic Issues and Check ears    HPI:  1. Essential hypertension, benign No c/o chest pain, sob or headache. Does not check bloodpresure at home. BP Readings from Last 3 Encounters:  11/09/20 (!) 131/91  05/18/20 132/86  04/11/20 120/83     2. Hyperlipidemia with target LDL less than 100 Does try to watch diet. No dedicated exercise. Lab Results  Component Value Date   CHOL 224 (H) 08/13/2019   HDL 31 (L) 08/13/2019   LDLCALC 151 (H) 08/13/2019   TRIG 229 (H) 08/13/2019   CHOLHDL 7.2 (H) 08/13/2019     3. Migraine with aura and without status migrainosus, not intractable Has not had any in several months.  4. insomnia Takes ambien to sleeps. Sleeps about 6-7 hours a night   Outpatient Encounter Medications as of 11/09/2020  Medication Sig  . aspirin 81 MG chewable tablet Chew 81 mg by mouth daily.  Marland Kitchen atorvastatin (LIPITOR) 40 MG tablet Take 1 tablet (40 mg total) by mouth daily.  . fenofibrate (TRICOR) 145 MG tablet Take 1 tablet (145 mg total) by mouth daily.  . hydrochlorothiazide (HYDRODIURIL) 25 MG tablet Take 1 tablet (25 mg total) by mouth daily.  Marland Kitchen ibuprofen (ADVIL) 200 MG tablet Take 800 mg by mouth every 8 (eight) hours as needed.  . zolpidem (AMBIEN) 10 MG tablet Take 1 tablet (10 mg total) by mouth at bedtime as needed. for sleep  . [DISCONTINUED] traMADol (ULTRAM) 50 MG tablet Take 50 mg by mouth every 8 (eight) hours as needed.   No facility-administered encounter medications on file as of 11/09/2020.    Past Surgical History:  Procedure Laterality Date  . CHOLECYSTECTOMY    . TUBAL LIGATION      Family History  Problem Relation Age of Onset  . Diabetes Mother   . Heart disease Father   . Diabetes Father   . Healthy Sister   . Healthy Brother   . Healthy Sister     New complaints: None today  Social  history: Lives by herself. Is traveling to [place] to keep her grand kids this summer  Controlled substance contract: 02/22/20    Review of Systems  Constitutional: Negative for diaphoresis.  Eyes: Negative for pain.  Respiratory: Negative for shortness of breath.   Cardiovascular: Negative for chest pain, palpitations and leg swelling.  Gastrointestinal: Negative for abdominal pain.  Endocrine: Negative for polydipsia.  Skin: Negative for rash.  Neurological: Negative for dizziness, weakness and headaches.  Hematological: Does not bruise/bleed easily.  All other systems reviewed and are negative.      Objective:   Physical Exam Vitals and nursing note reviewed.  Constitutional:      General: She is not in acute distress.    Appearance: Normal appearance. She is well-developed.  HENT:     Head: Normocephalic.     Nose: Nose normal.  Eyes:     Pupils: Pupils are equal, round, and reactive to light.  Neck:     Vascular: No carotid bruit or JVD.  Cardiovascular:     Rate and Rhythm: Normal rate and regular rhythm.     Heart sounds: Normal heart sounds.  Pulmonary:     Effort: Pulmonary effort is normal. No respiratory distress.     Breath sounds: Normal breath sounds. No wheezing or rales.  Chest:  Chest wall: No tenderness.  Abdominal:     General: Bowel sounds are normal. There is no distension or abdominal bruit.     Palpations: Abdomen is soft. There is no hepatomegaly, splenomegaly, mass or pulsatile mass.     Tenderness: There is no abdominal tenderness.  Musculoskeletal:        General: Normal range of motion.     Cervical back: Normal range of motion and neck supple.  Lymphadenopathy:     Cervical: No cervical adenopathy.  Skin:    General: Skin is warm and dry.  Neurological:     Mental Status: She is alert and oriented to person, place, and time.     Deep Tendon Reflexes: Reflexes are normal and symmetric.  Psychiatric:        Behavior: Behavior  normal.        Thought Content: Thought content normal.        Judgment: Judgment normal.    BP (!) 131/91   Pulse 74   Temp 97.9 F (36.6 C) (Temporal)   Resp 20   Ht 5' (1.524 m)   Wt 136 lb (61.7 kg)   LMP 07/17/2012   SpO2 94%   BMI 26.56 kg/m         Assessment & Plan:  [name] comes in today with chief complaint of Medical Management of Chronic Issues and Check ears   Diagnosis and orders addressed:  1. Essential hypertension, benign Low sodium diet - hydrochlorothiazide (HYDRODIURIL) 25 MG tablet; Take 1 tablet (25 mg total) by mouth daily.  Dispense: 90 tablet; Refill: 1 - CBC with Differential/Platelet - CMP14+EGFR  2. Hyperlipidemia with target LDL less than 100 Low fat diet - atorvastatin (LIPITOR) 40 MG tablet; Take 1 tablet (40 mg total) by mouth daily.  Dispense: 90 tablet; Refill: 1 - fenofibrate (TRICOR) 145 MG tablet; Take 1 tablet (145 mg total) by mouth daily.  Dispense: 90 tablet; Refill: 1 - Lipid panel  3. Migraine with aura and without status migrainosus, not intractable  4. Primary insomnia Bedtime routine - zolpidem (AMBIEN) 10 MG tablet; Take 1 tablet (10 mg total) by mouth at bedtime as needed. for sleep  Dispense: 90 tablet; Refill: 1   Labs pending Health Maintenance reviewed Diet and exercise encouraged  Follow up plan: 6 months   Mary-Margaret Hassell Done, FNP

## 2020-11-10 ENCOUNTER — Other Ambulatory Visit: Payer: Self-pay

## 2020-11-10 ENCOUNTER — Telehealth: Payer: Self-pay | Admitting: Nurse Practitioner

## 2020-11-10 MED ORDER — MONTELUKAST SODIUM 10 MG PO TABS: 10.0000 mg | ORAL_TABLET | Freq: Every day | ORAL | 1 refills | Status: DC

## 2020-11-29 ENCOUNTER — Telehealth: Payer: Self-pay | Admitting: Nurse Practitioner

## 2020-11-29 ENCOUNTER — Other Ambulatory Visit: Payer: Self-pay

## 2020-11-29 ENCOUNTER — Ambulatory Visit: Payer: Medicare Other

## 2020-11-29 NOTE — Telephone Encounter (Signed)
Patient aware and verbalized understanding. °

## 2020-12-01 LAB — COLOGUARD: Cologuard: NEGATIVE

## 2021-03-05 ENCOUNTER — Other Ambulatory Visit: Payer: Self-pay | Admitting: Family Medicine

## 2021-03-05 DIAGNOSIS — Z1231 Encounter for screening mammogram for malignant neoplasm of breast: Secondary | ICD-10-CM

## 2021-03-07 ENCOUNTER — Ambulatory Visit
Admission: RE | Admit: 2021-03-07 | Discharge: 2021-03-07 | Disposition: A | Discharge status: Home / Self Care | Payer: Medicare Other | Source: Ambulatory Visit | Attending: Family Medicine | Admitting: Family Medicine

## 2021-03-07 ENCOUNTER — Other Ambulatory Visit: Payer: Self-pay

## 2021-03-07 DIAGNOSIS — Z1231 Encounter for screening mammogram for malignant neoplasm of breast: Secondary | ICD-10-CM

## 2021-04-27 ENCOUNTER — Other Ambulatory Visit: Payer: Self-pay | Admitting: Nurse Practitioner

## 2021-04-27 DIAGNOSIS — F5101 Primary insomnia: Secondary | ICD-10-CM

## 2021-05-09 ENCOUNTER — Ambulatory Visit (INDEPENDENT_AMBULATORY_CARE_PROVIDER_SITE_OTHER): Payer: Medicare Other | Admitting: Nurse Practitioner

## 2021-05-09 ENCOUNTER — Other Ambulatory Visit: Payer: Self-pay

## 2021-05-09 ENCOUNTER — Encounter: Payer: Self-pay | Admitting: Nurse Practitioner

## 2021-05-09 VITALS — BP 134/78 | HR 78 | Temp 98.2°F | Resp 20 | Ht 60.0 in | Wt 135.0 lb

## 2021-05-09 DIAGNOSIS — R079 Chest pain, unspecified: Secondary | ICD-10-CM

## 2021-05-09 DIAGNOSIS — Z23 Encounter for immunization: Secondary | ICD-10-CM

## 2021-05-09 DIAGNOSIS — F5101 Primary insomnia: Secondary | ICD-10-CM

## 2021-05-09 DIAGNOSIS — E785 Hyperlipidemia, unspecified: Secondary | ICD-10-CM | POA: Diagnosis not present

## 2021-05-09 DIAGNOSIS — G43109 Migraine with aura, not intractable, without status migrainosus: Secondary | ICD-10-CM | POA: Diagnosis not present

## 2021-05-09 DIAGNOSIS — E559 Vitamin D deficiency, unspecified: Secondary | ICD-10-CM | POA: Diagnosis not present

## 2021-05-09 DIAGNOSIS — I10 Essential (primary) hypertension: Secondary | ICD-10-CM

## 2021-05-09 MED ORDER — HYDROCHLOROTHIAZIDE 25 MG PO TABS: 25.0000 mg | ORAL_TABLET | Freq: Every day | ORAL | 1 refills | Status: DC

## 2021-05-09 MED ORDER — ZOLPIDEM TARTRATE 10 MG PO TABS
10.0000 mg | ORAL_TABLET | Freq: Every evening | ORAL | 1 refills | Status: DC | PRN
Start: 2021-05-09 — End: 2021-11-06

## 2021-05-09 MED ORDER — ATORVASTATIN CALCIUM 40 MG PO TABS: 40.0000 mg | ORAL_TABLET | Freq: Every day | ORAL | 1 refills | Status: DC

## 2021-05-09 MED ORDER — MONTELUKAST SODIUM 10 MG PO TABS: 10.0000 mg | ORAL_TABLET | Freq: Every day | ORAL | 1 refills | Status: DC

## 2021-05-09 MED ORDER — FENOFIBRATE 145 MG PO TABS: 145.0000 mg | ORAL_TABLET | Freq: Every day | ORAL | 1 refills | Status: DC

## 2021-05-09 NOTE — Progress Notes (Signed)
Subjective:    Patient ID: [name], female    DOB: 22-Feb-1961, 60 y.o.   MRN: [id number]   Chief Complaint: medical management of chronic issues     HPI:  1. Essential hypertension, benign No c/o chest pain, sob or headache. Does not check bood pressure at home. BP Readings from Last 3 Encounters:  11/09/20 (!) 131/91  05/18/20 132/86  04/11/20 120/83     2. Hyperlipidemia with target LDL less than 100 Does try to watch diet and does little to no dedicated exercise Lab Results  Component Value Date   CHOL 144 11/09/2020   HDL 29 (L) 11/09/2020   LDLCALC 86 11/09/2020   TRIG 166 (H) 11/09/2020   CHOLHDL 5.0 (H) 11/09/2020     3. Migraine with aura and without status migrainosus, not intractable Has not had migraines lately  4. Vitamin D deficiency Is on daily vitamin d supplement  5. insomnia Takes ambien  to sleep at night   Outpatient Encounter Medications as of 05/09/2021  Medication Sig   aspirin 81 MG chewable tablet Chew 81 mg by mouth daily.   atorvastatin (LIPITOR) 40 MG tablet Take 1 tablet (40 mg total) by mouth daily.   fenofibrate (TRICOR) 145 MG tablet Take 1 tablet (145 mg total) by mouth daily.   hydrochlorothiazide (HYDRODIURIL) 25 MG tablet Take 1 tablet (25 mg total) by mouth daily.   ibuprofen (ADVIL) 200 MG tablet Take 800 mg by mouth every 8 (eight) hours as needed.   montelukast (SINGULAIR) 10 MG tablet Take 1 tablet (10 mg total) by mouth at bedtime.   zolpidem (AMBIEN) 10 MG tablet Take 1 tablet (10 mg total) by mouth at bedtime as needed. for sleep   No facility-administered encounter medications on file as of 05/09/2021.    Past Surgical History:  Procedure Laterality Date   CHOLECYSTECTOMY     TUBAL LIGATION      Family History  Problem Relation Age of Onset   Diabetes Mother    Heart disease Father    Diabetes Father    Healthy Sister    Healthy Brother    Healthy Sister     New complaints: HAS HAD SOME CHEST  PAIN. SHE THINKS IT IS ANXIETY BECAUSE HER DAD JUST PASSED AWAY  Social history: Lives by herself  Controlled substance contract: n/a     Review of Systems  Constitutional:  Negative for diaphoresis.  Eyes:  Negative for pain.  Respiratory:  Negative for shortness of breath.   Cardiovascular:  Negative for chest pain, palpitations and leg swelling.  Gastrointestinal:  Negative for abdominal pain.  Endocrine: Negative for polydipsia.  Skin:  Negative for rash.  Neurological:  Positive for headaches (occasional). Negative for dizziness and weakness.  Hematological:  Does not bruise/bleed easily.  All other systems reviewed and are negative.     Objective:   Physical Exam Vitals and nursing note reviewed.  Constitutional:      General: She is not in acute distress.    Appearance: Normal appearance. She is well-developed.  HENT:     Head: Normocephalic.     Right Ear: Tympanic membrane normal.     Left Ear: Tympanic membrane normal.     Nose: Nose normal.     Mouth/Throat:     Mouth: Mucous membranes are moist.  Eyes:     Pupils: Pupils are equal, round, and reactive to light.  Neck:     Vascular: No carotid bruit or JVD.  Cardiovascular:     Rate and Rhythm: Normal rate and regular rhythm.     Heart sounds: Normal heart sounds.  Pulmonary:     Effort: Pulmonary effort is normal. No respiratory distress.     Breath sounds: Normal breath sounds. No wheezing or rales.  Chest:     Chest wall: No tenderness.  Abdominal:     General: Bowel sounds are normal. There is no distension or abdominal bruit.     Palpations: Abdomen is soft. There is no hepatomegaly, splenomegaly, mass or pulsatile mass.     Tenderness: There is no abdominal tenderness.  Musculoskeletal:        General: Normal range of motion.     Cervical back: Normal range of motion and neck supple.  Lymphadenopathy:     Cervical: No cervical adenopathy.  Skin:    General: Skin is warm and dry.   Neurological:     Mental Status: She is alert and oriented to person, place, and time.     Deep Tendon Reflexes: Reflexes are normal and symmetric.  Psychiatric:        Behavior: Behavior normal.        Thought Content: Thought content normal.        Judgment: Judgment normal.    BP 134/78   Pulse 78   Temp 98.2 F (36.8 C) (Temporal)   Resp 20   Ht 5' (1.524 m)   Wt 135 lb (61.2 kg)   LMP 07/17/2012   SpO2 99%   BMI 26.37 kg/m    EKG- NSR-Mary-Margaret Hassell Done, FNP        Assessment & Plan:  [name] comes in today with chief complaint of Medical Management of Chronic Issues   Diagnosis and orders addressed:  1. Essential hypertension, benign Low sodium diet - hydrochlorothiazide (HYDRODIURIL) 25 MG tablet; Take 1 tablet (25 mg total) by mouth daily.  Dispense: 90 tablet; Refill: 1 - CBC with Differential/Platelet - CMP14+EGFR  2. Hyperlipidemia with target LDL less than 100 Low fat diet - atorvastatin (LIPITOR) 40 MG tablet; Take 1 tablet (40 mg total) by mouth daily.  Dispense: 90 tablet; Refill: 1 - fenofibrate (TRICOR) 145 MG tablet; Take 1 tablet (145 mg total) by mouth daily.  Dispense: 90 tablet; Refill: 1 - Lipid panel  3. Migraine with aura and without status migrainosus, not intractable  4. Vitamin D deficiency Continue vitamin d supplement  5. Primary insomnia Bedtime routine - zolpidem (AMBIEN) 10 MG tablet; Take 1 tablet (10 mg total) by mouth at bedtime as needed. for sleep  Dispense: 90 tablet; Refill: 1  6. Chest pain, unspecified type Stress management - EKG 12-Lead   Labs pending Health Maintenance reviewed Diet and exercise encouraged  Follow up plan: 6 months   [place], FNP

## 2021-05-09 NOTE — Patient Instructions (Signed)
Generalized Anxiety Disorder, Adult Generalized anxiety disorder (GAD) is a mental health condition. Unlike normal worries, anxiety related to GAD is not triggered by a specific event. These worries do not fade or get better with time. GAD interferes with relationships, work, and school. GAD symptoms can vary from mild to severe. People with severe GAD can have intense waves of anxiety with physical symptoms that are similar to panic attacks. What are the causes? The exact cause of GAD is not known, but the following are believed to have an impact: Differences in natural brain chemicals. Genes passed down from parents to children. Differences in the way threats are perceived. Development during childhood. Personality. What increases the risk? The following factors may make you more likely to develop this condition: Being female. Having a family history of anxiety disorders. Being very shy. Experiencing very stressful life events, such as the death of a loved one. Having a very stressful family environment. What are the signs or symptoms? People with GAD often worry excessively about many things in their lives, such as their health and family. Symptoms may also include: Mental and emotional symptoms: Worrying excessively about natural disasters. Fear of being late. Difficulty concentrating. Fears that others are judging your performance. Physical symptoms: Fatigue. Headaches, muscle tension, muscle twitches, trembling, or feeling shaky. Feeling like your heart is pounding or beating very fast. Feeling out of breath or like you cannot take a deep breath. Having trouble falling asleep or staying asleep, or experiencing restlessness. Sweating. Nausea, diarrhea, or irritable bowel syndrome (IBS). Behavioral symptoms: Experiencing erratic moods or irritability. Avoidance of new situations. Avoidance of people. Extreme difficulty making decisions. How is this diagnosed? This condition  is diagnosed based on your symptoms and medical history. You will also have a physical exam. Your health care provider may perform tests to rule out other possible causes of your symptoms. To be diagnosed with GAD, a person must have anxiety that: Is out of his or her control. Affects several different aspects of his or her life, such as work and relationships. Causes distress that makes him or her unable to take part in normal activities. Includes at least three symptoms of GAD, such as restlessness, fatigue, trouble concentrating, irritability, muscle tension, or sleep problems. Before your health care provider can confirm a diagnosis of GAD, these symptoms must be present more days than they are not, and they must last for 6 months or longer. How is this treated? This condition may be treated with: Medicine. Antidepressant medicine is usually prescribed for long-term daily control. Anti-anxiety medicines may be added in severe cases, especially when panic attacks occur. Talk therapy (psychotherapy). Certain types of talk therapy can be helpful in treating GAD by providing support, education, and guidance. Options include: Cognitive behavioral therapy (CBT). People learn coping skills and self-calming techniques to ease their physical symptoms. They learn to identify unrealistic thoughts and behaviors and to replace them with more appropriate thoughts and behaviors. Acceptance and commitment therapy (ACT). This treatment teaches people how to be mindful as a way to cope with unwanted thoughts and feelings. Biofeedback. This process trains you to manage your body's response (physiological response) through breathing techniques and relaxation methods. You will work with a therapist while machines are used to monitor your physical symptoms. Stress management techniques. These include yoga, meditation, and exercise. A mental health specialist can help determine which treatment is best for you. Some  people see improvement with one type of therapy. However, other people require a combination   of therapies. Follow these instructions at home: Lifestyle Maintain a consistent routine and schedule. Anticipate stressful situations. Create a plan, and allow extra time to work with your plan. Practice stress management or self-calming techniques that you have learned from your therapist or your health care provider. General instructions Take over-the-counter and prescription medicines only as told by your health care provider. Understand that you are likely to have setbacks. Accept this and be kind to yourself as you persist to take better care of yourself. Recognize and accept your accomplishments, even if you judge them as small. Keep all follow-up visits as told by your health care provider. This is important. Contact a health care provider if: Your symptoms do not get better. Your symptoms get worse. You have signs of depression, such as: A persistently sad or irritable mood. Loss of enjoyment in activities that used to bring you joy. Change in weight or eating. Changes in sleeping habits. Avoiding friends or family members. Loss of energy for normal tasks. Feelings of guilt or worthlessness. Get help right away if: You have serious thoughts about hurting yourself or others. If you ever feel like you may hurt yourself or others, or have thoughts about taking your own life, get help right away. Go to your nearest emergency department or: Call your local emergency services (911 in the [place] Call a suicide crisis helpline, such as the National Suicide Prevention Lifeline at 1-800-273-8255. This is open 24 hours a day in the [place] Text the Crisis Text Line at 741741 (in the [place] Summary Generalized anxiety disorder (GAD) is a mental health condition that involves worry that is not triggered by a specific event. People with GAD often worry excessively about many things in their lives, such  as their health and family. GAD may cause symptoms such as restlessness, trouble concentrating, sleep problems, frequent sweating, nausea, diarrhea, headaches, and trembling or muscle twitching. A mental health specialist can help determine which treatment is best for you. Some people see improvement with one type of therapy. However, other people require a combination of therapies. This information is not intended to replace advice given to you by your health care provider. Make sure you discuss any questions you have with your health care provider. Document Revised: 04/14/2019 Document Reviewed: 04/14/2019 Elsevier Patient Education  2022 [employer]  

## 2021-05-14 ENCOUNTER — Ambulatory Visit: Payer: Self-pay | Admitting: Nurse Practitioner

## 2021-08-02 ENCOUNTER — Ambulatory Visit (INDEPENDENT_AMBULATORY_CARE_PROVIDER_SITE_OTHER): Payer: Medicare Other | Admitting: Nurse Practitioner

## 2021-08-02 ENCOUNTER — Ambulatory Visit (INDEPENDENT_AMBULATORY_CARE_PROVIDER_SITE_OTHER): Payer: Medicare Other

## 2021-08-02 ENCOUNTER — Encounter: Payer: Self-pay | Admitting: Nurse Practitioner

## 2021-08-02 VITALS — BP 134/85 | HR 83 | Temp 98.1°F | Resp 20 | Ht 60.0 in | Wt 138.0 lb

## 2021-08-02 DIAGNOSIS — M25551 Pain in right hip: Secondary | ICD-10-CM

## 2021-08-02 MED ORDER — PREDNISONE 10 MG (21) PO TBPK: ORAL_TABLET | ORAL | 0 refills | Status: DC

## 2021-08-02 NOTE — Progress Notes (Signed)
° °  Subjective:    Patient ID: [name], female    DOB: Jan 02, 1961, 61 y.o.   MRN: [id number]  Chief Complaint: Hip Pain (Feels like something grinding. No injury. Pops when turning over in the bed)   Hip Pain  Incident onset: Just came back from [place] and had to do alot of walking when changing planes and hip hasbeen hruting every since. The pain is present in the right hip. The quality of the pain is described as burning and aching. The pain is at a severity of 7/10. The pain is moderate. The pain has been Constant since onset. She reports no foreign bodies present. The symptoms are aggravated by movement and weight bearing. She has tried acetaminophen for the symptoms. The treatment provided mild relief.      Review of Systems  Constitutional:  Negative for diaphoresis.  Eyes:  Negative for pain.  Respiratory:  Negative for shortness of breath.   Cardiovascular:  Negative for chest pain, palpitations and leg swelling.  Gastrointestinal:  Negative for abdominal pain.  Endocrine: Negative for polydipsia.  Musculoskeletal:  Positive for arthralgias (right hip).  Skin:  Negative for rash.  Neurological:  Negative for dizziness, weakness and headaches.  Hematological:  Does not bruise/bleed easily.  All other systems reviewed and are negative.     Objective:   Physical Exam Constitutional:      Appearance: Normal appearance.  Cardiovascular:     Rate and Rhythm: Normal rate and regular rhythm.     Heart sounds: Normal heart sounds.  Pulmonary:     Breath sounds: Normal breath sounds.  Musculoskeletal:     Comments: FROM of right hip with pain on external rotation  Skin:    General: Skin is warm.  Neurological:     General: No focal deficit present.     Mental Status: She is alert and oriented to person, place, and time.  Psychiatric:        Mood and Affect: Mood normal.        Behavior: Behavior normal.   BP 134/85    Pulse 83    Temp 98.1 F (36.7 C)  (Temporal)    Resp 20    Ht 5' (1.524 m)    Wt 138 lb (62.6 kg)    LMP 07/17/2012    SpO2 98%    BMI 26.95 kg/m    Right hip xray- osteoarthritis of right hip-Preliminary reading by Paulene Floor, FNP  [facility]      Assessment & Plan:   [name] in today with chief complaint of Hip Pain (Feels like something grinding. No injury. Pops when turning over in the bed)   1. Right hip pain Moist heat Rest RTO prn - DG HIP UNILAT W OR W/O PELVIS 2-3 VIEWS RIGHT - predniSONE (STERAPRED UNI-PAK 21 TAB) 10 MG (21) TBPK tablet; As directed x 6 days  Dispense: 21 tablet; Refill: 0    The above assessment and management plan was discussed with the patient. The patient verbalized understanding of and has agreed to the management plan. Patient is aware to call the clinic if symptoms persist or worsen. Patient is aware when to return to the clinic for a follow-up visit. Patient educated on when it is appropriate to go to the emergency department.   Mary-Margaret Daphine Deutscher, FNP

## 2021-08-02 NOTE — Patient Instructions (Signed)
Hip Pain The hip is the joint between the upper legs and the lower pelvis. The bones, cartilage, tendons, and muscles of your hip joint support your body and allow you to move around. Hip pain can range from a minor ache to severe pain in one or both of your hips. The pain may be felt on the inside of the hip joint near the groin, or on the outside near the buttocks and upper thigh. You may also have swelling or stiffness in your hip area. Follow these instructions at home: Managing pain, stiffness, and swelling   If directed, put ice on the painful area. To do this: Put ice in a plastic bag. Place a towel between your skin and the bag. Leave the ice on for 20 minutes, 2-3 times a day. If directed, apply heat to the affected area as often as told by your health care provider. Use the heat source that your health care provider recommends, such as a moist heat pack or a heating pad. Place a towel between your skin and the heat source. Leave the heat on for 20-30 minutes. Remove the heat if your skin turns bright red. This is especially important if you are unable to feel pain, heat, or cold. You may have a greater risk of getting burned. Activity Do exercises as told by your health care provider. Avoid activities that cause pain. General instructions  Take over-the-counter and prescription medicines only as told by your health care provider. Keep a journal of your symptoms. Write down: How often you have hip pain. The location of your pain. What the pain feels like. What makes the pain worse. Sleep with a pillow between your legs on your most comfortable side. Keep all follow-up visits as told by your health care provider. This is important. Contact a health care provider if: You cannot put weight on your leg. Your pain or swelling continues or gets worse after one week. It gets harder to walk. You have a fever. Get help right away if: You fall. You have a sudden increase in pain and  swelling in your hip. Your hip is red or swollen or very tender to touch. Summary Hip pain can range from a minor ache to severe pain in one or both of your hips. The pain may be felt on the inside of the hip joint near the groin, or on the outside near the buttocks and upper thigh. Avoid activities that cause pain. Write down how often you have hip pain, the location of the pain, what makes it worse, and what it feels like. This information is not intended to replace advice given to you by your health care provider. Make sure you discuss any questions you have with your health care provider. Document Revised: 11/09/2018 Document Reviewed: 11/09/2018 Elsevier Patient Education  2022 Elsevier Inc.  

## 2021-08-03 LAB — CMP14+EGFR
ALT: 25 IU/L (ref 0–32)
AST: 32 IU/L (ref 0–40)
Albumin/Globulin Ratio: 2.8 — ABNORMAL HIGH (ref 1.2–2.2)
Albumin: 5 g/dL — ABNORMAL HIGH (ref 3.8–4.9)
Alkaline Phosphatase: 83 IU/L (ref 44–121)
BUN/Creatinine Ratio: 18 (ref 9–23)
BUN: 14 mg/dL (ref 6–24)
Bilirubin Total: 0.3 mg/dL (ref 0.0–1.2)
CO2: 26 mmol/L (ref 20–29)
Calcium: 9.7 mg/dL (ref 8.7–10.2)
Chloride: 102 mmol/L (ref 96–106)
Creatinine, Ser: 0.8 mg/dL (ref 0.57–1.00)
Globulin, Total: 1.8 g/dL (ref 1.5–4.5)
Glucose: 104 mg/dL — ABNORMAL HIGH (ref 70–99)
Potassium: 3.9 mmol/L (ref 3.5–5.2)
Sodium: 145 mmol/L — ABNORMAL HIGH (ref 134–144)
Total Protein: 6.8 g/dL (ref 6.0–8.5)
eGFR: 85 mL/min/{1.73_m2} (ref >59)

## 2021-08-03 LAB — CBC WITH DIFFERENTIAL/PLATELET
Basophils Absolute: 0.1 10*3/uL (ref 0.0–0.2)
Basos: 1 %
EOS (ABSOLUTE): 0.2 10*3/uL (ref 0.0–0.4)
Eos: 3 %
Hematocrit: 47.8 % — ABNORMAL HIGH (ref 34.0–46.6)
Hemoglobin: 16.3 g/dL — ABNORMAL HIGH (ref 11.1–15.9)
Immature Grans (Abs): 0 10*3/uL (ref 0.0–0.1)
Immature Granulocytes: 0 %
Lymphocytes Absolute: 2.2 10*3/uL (ref 0.7–3.1)
Lymphs: 31 %
MCH: 30.1 pg (ref 26.6–33.0)
MCHC: 34.1 g/dL (ref 31.5–35.7)
MCV: 88 fL (ref 79–97)
Monocytes Absolute: 0.4 10*3/uL (ref 0.1–0.9)
Monocytes: 6 %
Neutrophils Absolute: 4.1 10*3/uL (ref 1.4–7.0)
Neutrophils: 59 %
Platelets: 253 10*3/uL (ref 150–450)
RBC: 5.41 x10E6/uL — ABNORMAL HIGH (ref 3.77–5.28)
RDW: 12 % (ref 11.7–15.4)
WBC: 7 10*3/uL (ref 3.4–10.8)

## 2021-08-03 LAB — LIPID PANEL
Chol/HDL Ratio: 5 ratio — ABNORMAL HIGH (ref 0.0–4.4)
Cholesterol, Total: 145 mg/dL (ref 100–199)
HDL: 29 mg/dL — ABNORMAL LOW (ref >39)
LDL Chol Calc (NIH): 85 mg/dL (ref 0–99)
Triglycerides: 180 mg/dL — ABNORMAL HIGH (ref 0–149)
VLDL Cholesterol Cal: 31 mg/dL (ref 5–40)

## 2021-11-02 ENCOUNTER — Ambulatory Visit (INDEPENDENT_AMBULATORY_CARE_PROVIDER_SITE_OTHER): Payer: Medicare Other

## 2021-11-02 VITALS — Wt 138.0 lb

## 2021-11-02 DIAGNOSIS — Z0000 Encounter for general adult medical examination without abnormal findings: Secondary | ICD-10-CM | POA: Diagnosis not present

## 2021-11-02 NOTE — Progress Notes (Signed)
? ?Subjective:  ? [name] is a 61 y.o. female who presents for Medicare Annual (Subsequent) preventive examination. ? ?Virtual Visit via Telephone Note ? ?I connected with  [name] on 11/02/21 at  2:00 PM EDT by telephone and verified that I am speaking with the correct person using two identifiers. ? ?Location: ?Patient: Home ?Provider: [facility] ?Persons participating in the virtual visit: patient/Nurse Health Advisor ?  ?I discussed the limitations, risks, security and privacy concerns of performing an evaluation and management service by telephone and the availability of in person appointments. The patient expressed understanding and agreed to proceed. ? ?Interactive audio and video telecommunications were attempted between this nurse and patient, however failed, due to patient having technical difficulties OR patient did not have access to video capability.  We continued and completed visit with audio only. ? ?Some vital signs may be absent or patient reported.  ? ?Aminta Sakurai Hurman HornE Syerra Abdelrahman, LPN  ? ?Review of Systems    ? ?Cardiac Risk Factors include: smoking/ tobacco exposure;hypertension;dyslipidemia ? ?   ?Objective:  ?  ?Today's Vitals  ? 11/02/21 1401  ?Weight: 138 lb (62.6 kg)  ?PainSc: 5   ? ?Body mass index is 26.95 kg/m?. ? ? ?  11/02/2021  ?  2:12 PM 11/01/2020  ?  2:51 PM 08/08/2020  ?  5:48 PM  ?Advanced Directives  ?Does Patient Have a Medical Advance Directive? No No No  ?Would patient like information on creating a medical advance directive? No - Patient declined Yes (MAU/Ambulatory/Procedural Areas - Information given)   ? ? ?Current Medications (verified) ?Outpatient Encounter Medications as of 11/02/2021  ?Medication Sig  ? aspirin 81 MG chewable tablet Chew 81 mg by mouth daily.  ? atorvastatin (LIPITOR) 40 MG tablet Take 1 tablet (40 mg total) by mouth daily.  ? fenofibrate (TRICOR) 145 MG tablet Take 1 tablet (145 mg total) by mouth daily.  ? hydrochlorothiazide (HYDRODIURIL) 25 MG tablet  Take 1 tablet (25 mg total) by mouth daily.  ? ibuprofen (ADVIL) 200 MG tablet Take 800 mg by mouth every 8 (eight) hours as needed.  ? montelukast (SINGULAIR) 10 MG tablet Take 1 tablet (10 mg total) by mouth at bedtime.  ? zolpidem (AMBIEN) 10 MG tablet Take 1 tablet (10 mg total) by mouth at bedtime as needed. for sleep  ? [DISCONTINUED] predniSONE (STERAPRED UNI-PAK 21 TAB) 10 MG (21) TBPK tablet As directed x 6 days  ? ?No facility-administered encounter medications on file as of 11/02/2021.  ? ? ?Allergies (verified) ?Baclofen  ? ?History: ?Past Medical History:  ?Diagnosis Date  ? HTN (hypertension)   ? Hyperlipidemia   ? Migraines   ? ?Past Surgical History:  ?Procedure Laterality Date  ? CHOLECYSTECTOMY    ? TUBAL LIGATION    ? ?Family History  ?Problem Relation Age of Onset  ? Diabetes Mother   ? Heart disease Father   ? Diabetes Father   ? Healthy Sister   ? Healthy Brother   ? Healthy Sister   ? ?Social History  ? ?Socioeconomic History  ? Marital status: Divorced  ?  Spouse name: Not on file  ? Number of children: 3  ? Years of education: Not on file  ? Highest education level: Not on file  ?Occupational History  ? Occupation: disability  ?Tobacco Use  ? Smoking status: Every Day  ?  Packs/day: 1.00  ?  Years: 44.00  ?  Pack years: 44.00  ?  Types: Cigarettes  ?  Start date: 10/02/1983  ? Smokeless tobacco: Never  ? Tobacco comments:  ?  Has been smoking 0.5-1 ppd since age 83  ?Vaping Use  ? Vaping Use: Never used  ?Substance and Sexual Activity  ? Alcohol use: No  ? Drug use: No  ? Sexual activity: Not on file  ?Other Topics Concern  ? Not on file  ?Social History Narrative  ? Lives alone, one child in [place], one in [place], son in [place]; parents live 2 miles away  ? ?Social Determinants of Health  ? ?Financial Resource Strain: Medium Risk  ? Difficulty of Paying Living Expenses: Somewhat hard  ?Food Insecurity: Food Insecurity Present  ? Worried About Programme researcher, broadcasting/film/video in the Last Year:  Sometimes true  ? Ran Out of Food in the Last Year: Sometimes true  ?Transportation Needs: No Transportation Needs  ? Lack of Transportation (Medical): No  ? Lack of Transportation (Non-Medical): No  ?Physical Activity: Sufficiently Active  ? Days of Exercise per Week: 7 days  ? Minutes of Exercise per Session: 60 min  ?Stress: No Stress Concern Present  ? Feeling of Stress : Not at all  ?Social Connections: Socially Isolated  ? Frequency of Communication with Friends and Family: More than three times a week  ? Frequency of Social Gatherings with Friends and Family: More than three times a week  ? Attends Religious Services: Never  ? Active Member of Clubs or Organizations: No  ? Attends Banker Meetings: Never  ? Marital Status: Divorced  ? ? ?Tobacco Counseling ?Ready to quit: No ?Counseling given: Yes ?Tobacco comments: Has been smoking 0.5-1 ppd since age 13 ? ? ?Clinical Intake: ? ?Pre-visit preparation completed: Yes ? ?Pain : 0-10 ?Pain Score: 5  ?Pain Type: Chronic pain ?Pain Location: Back ?Pain Orientation: Left ?Pain Radiating Towards: hip ?Pain Descriptors / Indicators: Aching, Nagging, Restless ?Pain Onset: More than a month ago ?Pain Frequency: Intermittent ? ?  ? ?BMI - recorded: 26.95 ?Nutritional Status: BMI 25 -29 Overweight ?Nutritional Risks: None ?Diabetes: No ? ?How often do you need to have someone help you when you read instructions, pamphlets, or other written materials from your doctor or pharmacy?: 1 - Never ? ?Diabetic? no ? ?Interpreter Needed?: No ? ?Information entered by :: Rhyan Radler, LPN ? ? ?Activities of Daily Living ? ?  11/02/2021  ?  2:12 PM  ?In your present state of health, do you have any difficulty performing the following activities:  ?Hearing? 0  ?Vision? 0  ?Difficulty concentrating or making decisions? 0  ?Walking or climbing stairs? 0  ?Dressing or bathing? 0  ?Doing errands, shopping? 0  ?Preparing Food and eating ? N  ?Using the Toilet? N  ?In the past  six months, have you accidently leaked urine? N  ?Do you have problems with loss of bowel control? N  ?Managing your Medications? N  ?Managing your Finances? N  ?Housekeeping or managing your Housekeeping? N  ? ? ?Patient Care Team: ?Bennie Pierini, FNP as PCP - General (Family Medicine) ?Michaelle Copas, MD as Referring Physician (Optometry) ?[employer] ? ?Indicate any recent Medical Services you may have received from other than [facility] providers in the past year (date may be approximate). ? ?   ?Assessment:  ? This is a routine wellness examination for [place]. ? ?Hearing/Vision screen ?Hearing Screening - Comments:: Denies hearing difficulties   ?Vision Screening - Comments:: Wears rx glasses - up to date with routine eye  exams with [facility] in [place] - has an appt with [facility] ? ?Dietary issues and exercise activities discussed: ?Current Exercise Habits: Home exercise routine, Type of exercise: walking, Time (Minutes): 60, Frequency (Times/Week): 7, Weekly Exercise (Minutes/Week): 420, Intensity: Mild, Exercise limited by: orthopedic condition(s) ? ? Goals Addressed   ? ?  ?  ?  ?  ? This Visit's Progress  ?  Exercise 3x per week (30 min per time)   On track  ?  Be more active ?Wants to be pain free by next year  ?Goals Addressed   ? ?  ?  ?  ?  ? This Visit's Progress  ?  Exercise 3x per week (30 min per time)   On track  ?  Be more active ?Wants to be pain free by next year  ? ?  ? ?  ? ? ?  ? ?  ? ?Depression Screen ? ?  11/02/2021  ?  2:10 PM 05/09/2021  ?  3:35 PM 11/09/2020  ?  9:58 AM 11/01/2020  ?  2:46 PM 05/18/2020  ?  2:30 PM 04/11/2020  ?  9:14 AM 02/16/2020  ?  3:05 PM  ?PHQ 2/9 Scores  ?PHQ - 2 Score 0 0 0 0 0 0 0  ?PHQ- 9 Score  0       ?  ?Fall Risk ? ?  11/02/2021  ?  2:03 PM 05/09/2021  ?  3:35 PM 11/09/2020  ?  9:58 AM 11/01/2020  ?  2:51 PM 05/18/2020  ?  2:30 PM  ?Fall Risk   ?Falls in the past year? 1 0 0 0 0  ?Number falls in past yr: 0   0   ?Injury with Fall? 0   0    ?Risk for fall due to : History of fall(s);Impaired balance/gait;Orthopedic patient   Orthopedic patient;Impaired balance/gait   ?Follow up Education provided;Falls prevention discussed   Falls preventio

## 2021-11-02 NOTE — Patient Instructions (Signed)
[name] , ?Thank you for taking time to come for your Medicare Wellness Visit. I appreciate your ongoing commitment to your health goals. Please review the following plan we discussed and let me know if I can assist you in the future.  ? ?Screening recommendations/referrals: ?Colonoscopy: Cologuard done 11/12/2020 - Repeat in 3 years  ?Mammogram: Done 03/07/2021 - Repeat annually ?Bone Density: Due at age 60 ?Recommended yearly ophthalmology/optometry visit for glaucoma screening and checkup ?Recommended yearly dental visit for hygiene and checkup ? ?Vaccinations: ?Influenza vaccine: Done 05/09/2021 - Repeat annually ?Pneumococcal vaccine: Due - consider once per lifetime [id number] ?Tdap vaccine: Done 11/29/2020 - Repeat in 10 years ?Shingles vaccine: Due - Shingrix is 2 doses 2-6 months apart and over 90% effective  *consider at next visit  ?Covid-19: Done  10/17/2019, 11/30/2019, & 01/04/2020 ? ?Advanced directives: Advance directive discussed with you today. Even though you declined this today, please call our office should you change your mind, and we can give you the proper paperwork for you to fill out.  ? ?Conditions/risks identified: Aim for 30 minutes of exercise or brisk walking, 6-8 glasses of water, and 5 servings of fruits and vegetables each day. ? ?Next appointment: Follow up in one year for your annual wellness visit.  ? ?Preventive Care 40-64 Years, Female ?Preventive care refers to lifestyle choices and visits with your health care provider that can promote health and wellness. ?What does preventive care include? ?A yearly physical exam. This is also called an annual well check. ?Dental exams once or twice a year. ?Routine eye exams. Ask your health care provider how often you should have your eyes checked. ?Personal lifestyle choices, including: ?Daily care of your teeth and gums. ?Regular physical activity. ?Eating a healthy diet. ?Avoiding tobacco and drug use. ?Limiting alcohol use. ?Practicing  safe sex. ?Taking low-dose aspirin daily starting at age 53. ?Taking vitamin and mineral supplements as recommended by your health care provider. ?What happens during an annual well check? ?The services and screenings done by your health care provider during your annual well check will depend on your age, overall health, lifestyle risk factors, and family history of disease. ?Counseling  ?Your health care provider may ask you questions about your: ?Alcohol use. ?Tobacco use. ?Drug use. ?Emotional well-being. ?Home and relationship well-being. ?Sexual activity. ?Eating habits. ?Work and work Statistician. ?Method of birth control. ?Menstrual cycle. ?Pregnancy history. ?Screening  ?You may have the following tests or measurements: ?Height, weight, and BMI. ?Blood pressure. ?Lipid and cholesterol levels. These may be checked every 5 years, or more frequently if you are over 28 years old. ?Skin check. ?Lung cancer screening. You may have this screening every year starting at age 57 if you have a 30-pack-year history of smoking and currently smoke or have quit within the past 15 years. ?Fecal occult blood test (FOBT) of the stool. You may have this test every year starting at age 25. ?Flexible sigmoidoscopy or colonoscopy. You may have a sigmoidoscopy every 5 years or a colonoscopy every 10 years starting at age 32. ?Hepatitis C blood test. ?Hepatitis B blood test. ?Sexually transmitted disease (STD) testing. ?Diabetes screening. This is done by checking your blood sugar (glucose) after you have not eaten for a while (fasting). You may have this done every 1-3 years. ?Mammogram. This may be done every 1-2 years. Talk to your health care provider about when you should start having regular mammograms. This may depend on whether you have a family history of breast cancer. ?BRCA-related  cancer screening. This may be done if you have a family history of breast, ovarian, tubal, or peritoneal cancers. ?Pelvic exam and Pap test.  This may be done every 3 years starting at age 3. Starting at age 71, this may be done every 5 years if you have a Pap test in combination with an HPV test. ?Bone density scan. This is done to screen for osteoporosis. You may have this scan if you are at high risk for osteoporosis. ?Discuss your test results, treatment options, and if necessary, the need for more tests with your health care provider. ?Vaccines  ?Your health care provider may recommend certain vaccines, such as: ?Influenza vaccine. This is recommended every year. ?Tetanus, diphtheria, and acellular pertussis (Tdap, Td) vaccine. You may need a Td booster every 10 years. ?Zoster vaccine. You may need this after age 75. ?Pneumococcal 13-valent conjugate (PCV13) vaccine. You may need this if you have certain conditions and were not previously vaccinated. ?Pneumococcal polysaccharide (PPSV23) vaccine. You may need one or two doses if you smoke cigarettes or if you have certain conditions. ?Talk to your health care provider about which screenings and vaccines you need and how often you need them. ?This information is not intended to replace advice given to you by your health care provider. Make sure you discuss any questions you have with your health care provider. ?Document Released: 07/21/2015 Document Revised: 03/13/2016 Document Reviewed: 04/25/2015 ?Elsevier Interactive Patient Education ? 2017 Elsevier Inc. ? ? ? ?Fall Prevention in the Home ?Falls can cause injuries. They can happen to people of all ages. There are many things you can do to make your home safe and to help prevent falls. ?What can I do on the outside of my home? ?Regularly fix the edges of walkways and driveways and fix any cracks. ?Remove anything that might make you trip as you walk through a door, such as a raised step or threshold. ?Trim any bushes or trees on the path to your home. ?Use bright outdoor lighting. ?Clear any walking paths of anything that might make someone trip,  such as rocks or tools. ?Regularly check to see if handrails are loose or broken. Make sure that both sides of any steps have handrails. ?Any raised decks and porches should have guardrails on the edges. ?Have any leaves, snow, or ice cleared regularly. ?Use sand or salt on walking paths during winter. ?Clean up any spills in your garage right away. This includes oil or grease spills. ?What can I do in the bathroom? ?Use night lights. ?Install grab bars by the toilet and in the tub and shower. Do not use towel bars as grab bars. ?Use non-skid mats or decals in the tub or shower. ?If you need to sit down in the shower, use a plastic, non-slip stool. ?Keep the floor dry. Clean up any water that spills on the floor as soon as it happens. ?Remove soap buildup in the tub or shower regularly. ?Attach bath mats securely with double-sided non-slip rug tape. ?Do not have throw rugs and other things on the floor that can make you trip. ?What can I do in the bedroom? ?Use night lights. ?Make sure that you have a light by your bed that is easy to reach. ?Do not use any sheets or blankets that are too big for your bed. They should not hang down onto the floor. ?Have a firm chair that has side arms. You can use this for support while you get dressed. ?Do not have throw  rugs and other things on the floor that can make you trip. ?What can I do in the kitchen? ?Clean up any spills right away. ?Avoid walking on wet floors. ?Keep items that you use a lot in easy-to-reach places. ?If you need to reach something above you, use a strong step stool that has a grab bar. ?Keep electrical cords out of the way. ?Do not use floor polish or wax that makes floors slippery. If you must use wax, use non-skid floor wax. ?Do not have throw rugs and other things on the floor that can make you trip. ?What can I do with my stairs? ?Do not leave any items on the stairs. ?Make sure that there are handrails on both sides of the stairs and use them. Fix  handrails that are broken or loose. Make sure that handrails are as long as the stairways. ?Check any carpeting to make sure that it is firmly attached to the stairs. Fix any carpet that is loose or worn.

## 2021-11-06 ENCOUNTER — Encounter: Payer: Self-pay | Admitting: Nurse Practitioner

## 2021-11-06 ENCOUNTER — Ambulatory Visit (INDEPENDENT_AMBULATORY_CARE_PROVIDER_SITE_OTHER): Payer: Medicare Other | Admitting: Nurse Practitioner

## 2021-11-06 VITALS — BP 144/82 | HR 82 | Temp 98.4°F | Resp 20 | Ht 60.0 in | Wt 141.0 lb

## 2021-11-06 DIAGNOSIS — F5101 Primary insomnia: Secondary | ICD-10-CM | POA: Diagnosis not present

## 2021-11-06 DIAGNOSIS — G43109 Migraine with aura, not intractable, without status migrainosus: Secondary | ICD-10-CM

## 2021-11-06 DIAGNOSIS — E785 Hyperlipidemia, unspecified: Secondary | ICD-10-CM

## 2021-11-06 DIAGNOSIS — E559 Vitamin D deficiency, unspecified: Secondary | ICD-10-CM

## 2021-11-06 DIAGNOSIS — M1990 Unspecified osteoarthritis, unspecified site: Secondary | ICD-10-CM

## 2021-11-06 DIAGNOSIS — I10 Essential (primary) hypertension: Secondary | ICD-10-CM

## 2021-11-06 MED ORDER — MONTELUKAST SODIUM 10 MG PO TABS: 10.0000 mg | ORAL_TABLET | Freq: Every day | ORAL | 1 refills | Status: DC

## 2021-11-06 MED ORDER — CELECOXIB 200 MG PO CAPS: 200.0000 mg | ORAL_CAPSULE | Freq: Two times a day (BID) | ORAL | 2 refills | Status: DC

## 2021-11-06 MED ORDER — HYDROCHLOROTHIAZIDE 25 MG PO TABS: 25.0000 mg | ORAL_TABLET | Freq: Every day | ORAL | 1 refills | Status: DC

## 2021-11-06 MED ORDER — FENOFIBRATE 145 MG PO TABS: 145.0000 mg | ORAL_TABLET | Freq: Every day | ORAL | 1 refills | Status: DC

## 2021-11-06 MED ORDER — ATORVASTATIN CALCIUM 40 MG PO TABS: 40.0000 mg | ORAL_TABLET | Freq: Every day | ORAL | 1 refills | Status: DC

## 2021-11-06 MED ORDER — AMLODIPINE BESYLATE 5 MG PO TABS: 5.0000 mg | ORAL_TABLET | Freq: Every day | ORAL | 1 refills | Status: DC

## 2021-11-06 MED ORDER — ZOLPIDEM TARTRATE 10 MG PO TABS: 10.0000 mg | ORAL_TABLET | Freq: Every evening | ORAL | 1 refills | Status: DC | PRN

## 2021-11-06 NOTE — Patient Instructions (Signed)
Hip Pain The hip is the joint between the upper legs and the lower pelvis. The bones, cartilage, tendons, and muscles of your hip joint support your body and allow you to move around. Hip pain can range from a minor ache to severe pain in one or both of your hips. The pain may be felt on the inside of the hip joint near the groin, or on the outside near the buttocks and upper thigh. You may also have swelling or stiffness in your hip area. Follow these instructions at home: Managing pain, stiffness, and swelling     If directed, put ice on the painful area. To do this: Put ice in a plastic bag. Place a towel between your skin and the bag. Leave the ice on for 20 minutes, 2-3 times a day. If directed, apply heat to the affected area as often as told by your health care provider. Use the heat source that your health care provider recommends, such as a moist heat pack or a heating pad. Place a towel between your skin and the heat source. Leave the heat on for 20-30 minutes. Remove the heat if your skin turns bright red. This is especially important if you are unable to feel pain, heat, or cold. You may have a greater risk of getting burned. Activity Do exercises as told by your health care provider. Avoid activities that cause pain. General instructions  Take over-the-counter and prescription medicines only as told by your health care provider. Keep a journal of your symptoms. Write down: How often you have hip pain. The location of your pain. What the pain feels like. What makes the pain worse. Sleep with a pillow between your legs on your most comfortable side. Keep all follow-up visits as told by your health care provider. This is important. Contact a health care provider if: You cannot put weight on your leg. Your pain or swelling continues or gets worse after one week. It gets harder to walk. You have a fever. Get help right away if: You fall. You have a sudden increase in pain  and swelling in your hip. Your hip is red or swollen or very tender to touch. Summary Hip pain can range from a minor ache to severe pain in one or both of your hips. The pain may be felt on the inside of the hip joint near the groin, or on the outside near the buttocks and upper thigh. Avoid activities that cause pain. Write down how often you have hip pain, the location of the pain, what makes it worse, and what it feels like. This information is not intended to replace advice given to you by your health care provider. Make sure you discuss any questions you have with your health care provider. Document Revised: 11/09/2018 Document Reviewed: 11/09/2018 Elsevier Patient Education  2023 [employer] Inc[employer]  

## 2021-11-06 NOTE — Progress Notes (Signed)
? ?Subjective:  ? ? Patient ID: [name], female    DOB: [birth year], 61 y.o.   MRN: [id number] ? ? ?Chief Complaint: Medical Management of Chronic Issues ?  ? ?HPI: ? ?[name] is a 61 y.o. who identifies as a female who was assigned female at birth.  ? ?Social history: ?Lives with: by herself - family checks on her daily ?Work history: retiredHotel manager work ? ? ?Comes in today for follow up of the following chronic medical issues: ? ?1. Essential hypertension, benign ?No c/o chest pain, or sob. Doe snot check blood pressure at home. ?BP Readings from Last 3 Encounters:  ?11/06/21 (!) 162/94  ?08/02/21 134/85  ?05/09/21 134/78  ? ? ? ?2. Hyperlipidemia with target LDL less than 100 ?Does try to watch diet. Does little to no exercise. ?Lab Results  ?Component Value Date  ? CHOL 145 08/02/2021  ? HDL 29 (L) 08/02/2021  ? LDLCALC 85 08/02/2021  ? TRIG 180 (H) 08/02/2021  ? CHOLHDL 5.0 (H) 08/02/2021  ? ? ? ?3. Migraine with aura and without status migrainosus, not intractable ?Have not had any recently ? ?4. Primary insomnia ?Is on ambien nightly and sleeps about 8 hours a night ? ?5. Vitamin D deficiency ?Is on daily vitamin d supplement ? ? ?New complaints: ?Arthritis is really starting to bother her. Mainly in her back and lower ext. To much activity increases pain. ? ?Allergies  ?Allergen Reactions  ? Baclofen Nausea And Vomiting  ? ?Outpatient Encounter Medications as of 11/06/2021  ?Medication Sig  ? aspirin 81 MG chewable tablet Chew 81 mg by mouth daily.  ? atorvastatin (LIPITOR) 40 MG tablet Take 1 tablet (40 mg total) by mouth daily.  ? fenofibrate (TRICOR) 145 MG tablet Take 1 tablet (145 mg total) by mouth daily.  ? hydrochlorothiazide (HYDRODIURIL) 25 MG tablet Take 1 tablet (25 mg total) by mouth daily.  ? ibuprofen (ADVIL) 200 MG tablet Take 800 mg by mouth every 8 (eight) hours as needed.  ? montelukast (SINGULAIR) 10 MG tablet Take 1 tablet (10 mg total) by mouth at bedtime.  ? zolpidem  (AMBIEN) 10 MG tablet Take 1 tablet (10 mg total) by mouth at bedtime as needed. for sleep  ? ?No facility-administered encounter medications on file as of 11/06/2021.  ? ? ?Past Surgical History:  ?Procedure Laterality Date  ? CHOLECYSTECTOMY    ? TUBAL LIGATION    ? ? ?Family History  ?Problem Relation Age of Onset  ? Diabetes Mother   ? Heart disease Father   ? Diabetes Father   ? Healthy Sister   ? Healthy Brother   ? Healthy Sister   ? ? ? ? ?Controlled substance contract: 11/06/21- toxassure today ? ? ? ? ?Review of Systems  ?Constitutional:  Negative for diaphoresis.  ?Eyes:  Negative for pain.  ?Respiratory:  Negative for shortness of breath.   ?Cardiovascular:  Negative for chest pain, palpitations and leg swelling.  ?Gastrointestinal:  Negative for abdominal pain.  ?Endocrine: Negative for polydipsia.  ?Skin:  Negative for rash.  ?Neurological:  Negative for dizziness, weakness and headaches.  ?Hematological:  Does not bruise/bleed easily.  ?All other systems reviewed and are negative. ? ?   ?Objective:  ? Physical Exam ?Vitals and nursing note reviewed.  ?Constitutional:   ?   General: She is not in acute distress. ?   Appearance: Normal appearance. She is well-developed.  ?HENT:  ?   Head: Normocephalic.  ?  Right Ear: Tympanic membrane normal.  ?   Left Ear: Tympanic membrane normal.  ?   Nose: Nose normal.  ?   Mouth/Throat:  ?   Mouth: Mucous membranes are moist.  ?Eyes:  ?   Pupils: Pupils are equal, round, and reactive to light.  ?Neck:  ?   Vascular: No carotid bruit or JVD.  ?Cardiovascular:  ?   Rate and Rhythm: Normal rate and regular rhythm.  ?   Heart sounds: Normal heart sounds.  ?Pulmonary:  ?   Effort: Pulmonary effort is normal. No respiratory distress.  ?   Breath sounds: Normal breath sounds. No wheezing or rales.  ?Chest:  ?   Chest wall: No tenderness.  ?Abdominal:  ?   General: Bowel sounds are normal. There is no distension or abdominal bruit.  ?   Palpations: Abdomen is soft. There is  no hepatomegaly, splenomegaly, mass or pulsatile mass.  ?   Tenderness: There is no abdominal tenderness.  ?Musculoskeletal:     ?   General: Normal range of motion.  ?   Cervical back: Normal range of motion and neck supple.  ?Lymphadenopathy:  ?   Cervical: No cervical adenopathy.  ?Skin: ?   General: Skin is warm and dry.  ?Neurological:  ?   Mental Status: She is alert and oriented to person, place, and time.  ?   Deep Tendon Reflexes: Reflexes are normal and symmetric.  ?Psychiatric:     ?   Behavior: Behavior normal.     ?   Thought Content: Thought content normal.     ?   Judgment: Judgment normal.  ? ? ?BP (!) 144/82   Pulse 82   Temp 98.4 ?F (36.9 ?C) (Temporal)   Resp 20   Ht 5' (1.524 m)   Wt 141 lb (64 kg)   LMP 07/17/2012   SpO2 96%   BMI 27.54 kg/m?  ? ? ? ? ? ?   ?Assessment & Plan:  ?[name] comes in today with chief complaint of Medical Management of Chronic Issues ? ? ?Diagnosis and orders addressed: ? ?1. Essential hypertension, benign ?Low sodium diet ?- ToxASSURE Select 13 (MW), Urine ?- amLODipine (NORVASC) 5 MG tablet; Take 1 tablet (5 mg total) by mouth daily.  Dispense: 90 tablet; Refill: 1 ? ?2. Hyperlipidemia with target LDL less than 100 ?Low fat diet ?- atorvastatin (LIPITOR) 40 MG tablet; Take 1 tablet (40 mg total) by mouth daily.  Dispense: 90 tablet; Refill: 1 ?- fenofibrate (TRICOR) 145 MG tablet; Take 1 tablet (145 mg total) by mouth daily.  Dispense: 90 tablet; Refill: 1 ? ?3. Migraine with aura and without status migrainosus, not intractable ?Avoid caffeine ? ?4. Primary insomnia ?Bedtime routine ?- zolpidem (AMBIEN) 10 MG tablet; Take 1 tablet (10 mg total) by mouth at bedtime as needed. for sleep  Dispense: 90 tablet; Refill: 1 ? ?5. Vitamin D deficiency ?Continue daily vitamin d supplement ? ?6. Arthritis ?Moist heat  ?- celecoxib (CELEBREX) 200 MG capsule; Take 1 capsule (200 mg total) by mouth 2 (two) times daily.  Dispense: 60 capsule; Refill: 2 ? ? ?Labs  pending ?Health Maintenance reviewed ?Diet and exercise encouraged ? ?Follow up plan: ?6 months ? ? ?Mary-Margaret Daphine Deutscher, FNP ? ?

## 2021-11-07 LAB — CBC WITH DIFFERENTIAL/PLATELET
Basophils Absolute: 0.1 10*3/uL (ref 0.0–0.2)
Basos: 1 %
EOS (ABSOLUTE): 0.2 10*3/uL (ref 0.0–0.4)
Eos: 3 %
Hematocrit: 43.4 % (ref 34.0–46.6)
Hemoglobin: 14.5 g/dL (ref 11.1–15.9)
Immature Grans (Abs): 0 10*3/uL (ref 0.0–0.1)
Immature Granulocytes: 0 %
Lymphocytes Absolute: 2.4 10*3/uL (ref 0.7–3.1)
Lymphs: 34 %
MCH: 29.8 pg (ref 26.6–33.0)
MCHC: 33.4 g/dL (ref 31.5–35.7)
MCV: 89 fL (ref 79–97)
Monocytes Absolute: 0.4 10*3/uL (ref 0.1–0.9)
Monocytes: 6 %
Neutrophils Absolute: 4 10*3/uL (ref 1.4–7.0)
Neutrophils: 56 %
Platelets: 274 10*3/uL (ref 150–450)
RBC: 4.86 x10E6/uL (ref 3.77–5.28)
RDW: 12.8 % (ref 11.7–15.4)
WBC: 7.2 10*3/uL (ref 3.4–10.8)

## 2021-11-07 LAB — CMP14+EGFR
ALT: 29 IU/L (ref 0–32)
AST: 32 IU/L (ref 0–40)
Albumin/Globulin Ratio: 2.4 — ABNORMAL HIGH (ref 1.2–2.2)
Albumin: 4.5 g/dL (ref 3.8–4.9)
Alkaline Phosphatase: 83 IU/L (ref 44–121)
BUN/Creatinine Ratio: 17 (ref 9–23)
BUN: 14 mg/dL (ref 6–24)
Bilirubin Total: 0.2 mg/dL (ref 0.0–1.2)
CO2: 24 mmol/L (ref 20–29)
Calcium: 9.4 mg/dL (ref 8.7–10.2)
Chloride: 106 mmol/L (ref 96–106)
Creatinine, Ser: 0.81 mg/dL (ref 0.57–1.00)
Globulin, Total: 1.9 g/dL (ref 1.5–4.5)
Glucose: 97 mg/dL (ref 70–99)
Potassium: 3.6 mmol/L (ref 3.5–5.2)
Sodium: 142 mmol/L (ref 134–144)
Total Protein: 6.4 g/dL (ref 6.0–8.5)
eGFR: 84 mL/min/{1.73_m2} (ref >59)

## 2021-11-07 LAB — LIPID PANEL
Chol/HDL Ratio: 7.4 ratio — ABNORMAL HIGH (ref 0.0–4.4)
Cholesterol, Total: 192 mg/dL (ref 100–199)
HDL: 26 mg/dL — ABNORMAL LOW (ref >39)
LDL Chol Calc (NIH): 110 mg/dL — ABNORMAL HIGH (ref 0–99)
Triglycerides: 324 mg/dL — ABNORMAL HIGH (ref 0–149)
VLDL Cholesterol Cal: 56 mg/dL — ABNORMAL HIGH (ref 5–40)

## 2021-11-13 LAB — TOXASSURE SELECT 13 (MW), URINE

## 2022-02-02 IMAGING — MG MM DIGITAL SCREENING BILAT W/ TOMO AND CAD
8 series · 8 of 24 positions shown · non-contrast
Comparison: Previous exam(s).

CLINICAL DATA: Screening.

EXAM:
DIGITAL SCREENING BILATERAL MAMMOGRAM WITH TOMOSYNTHESIS AND CAD
TECHNIQUE: Bilateral screening digital craniocaudal and mediolateral oblique
mammograms were obtained. Bilateral screening digital breast
tomosynthesis was performed. The images were evaluated with
computer-aided detection.

[R CC synth-2D]
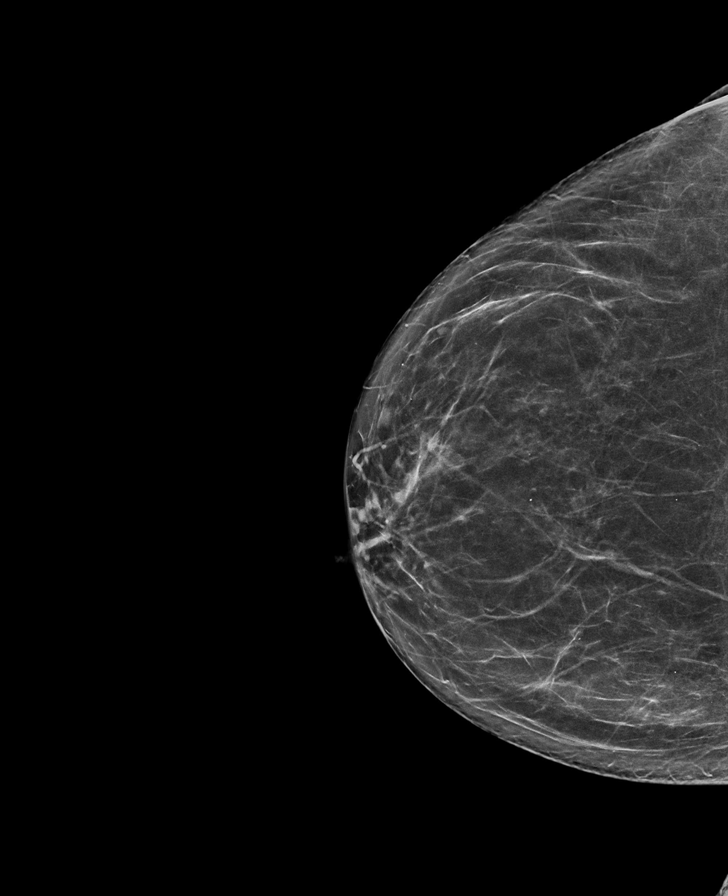

[L CC synth-2D]
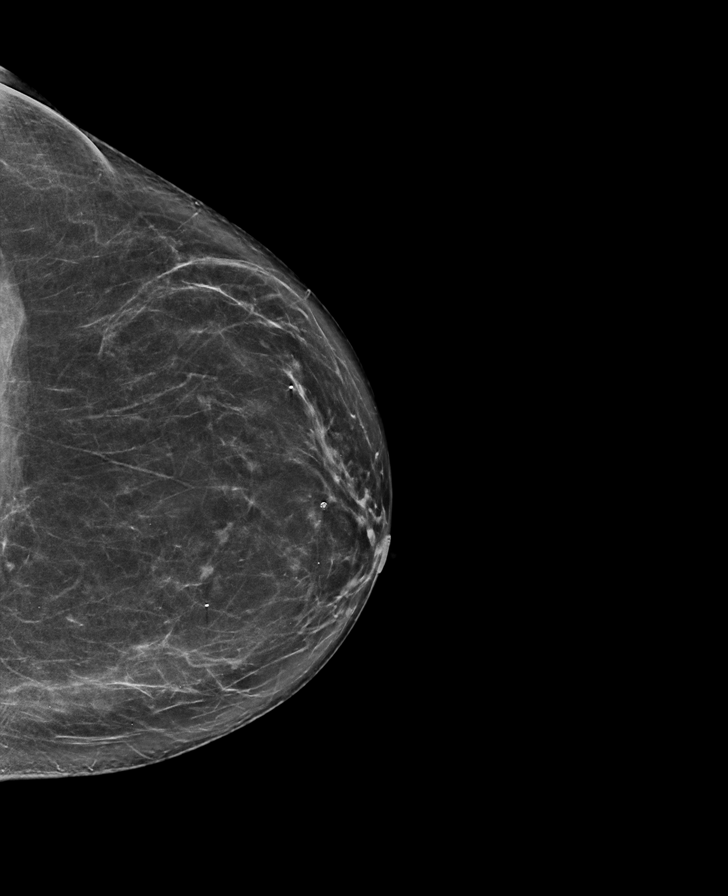

[L MLO synth-2D]
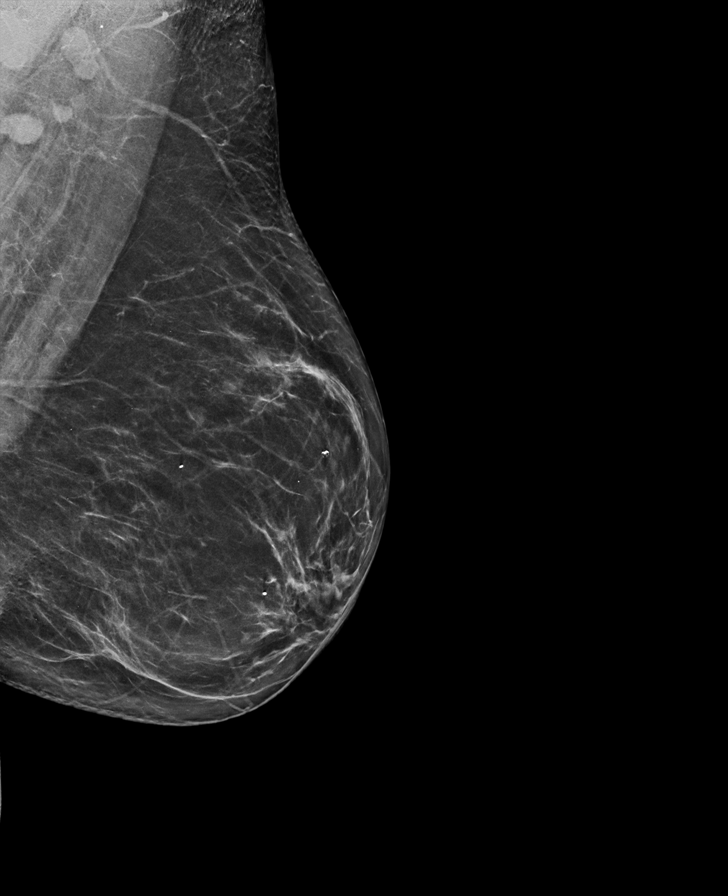

[R MLO synth-2D]
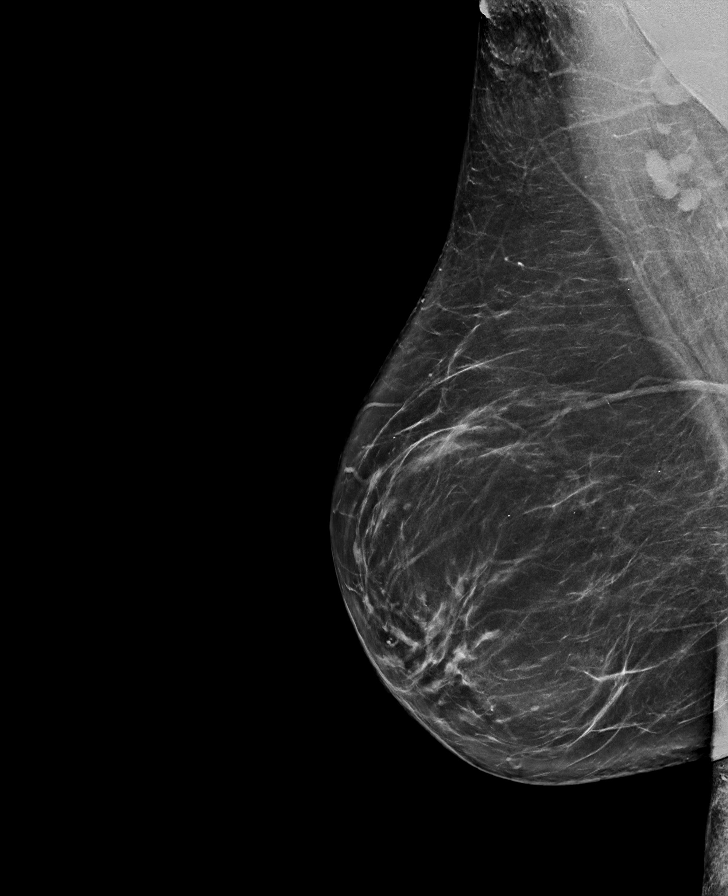

[L CC tomo · tomo slice 37/72.0]
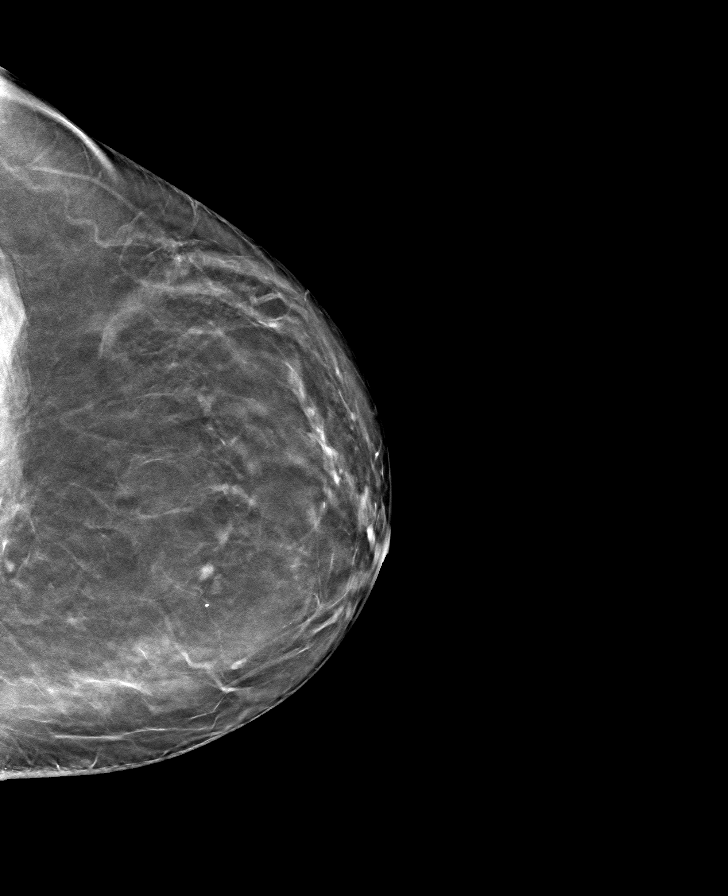

[R MLO tomo · tomo slice 41/82.0]
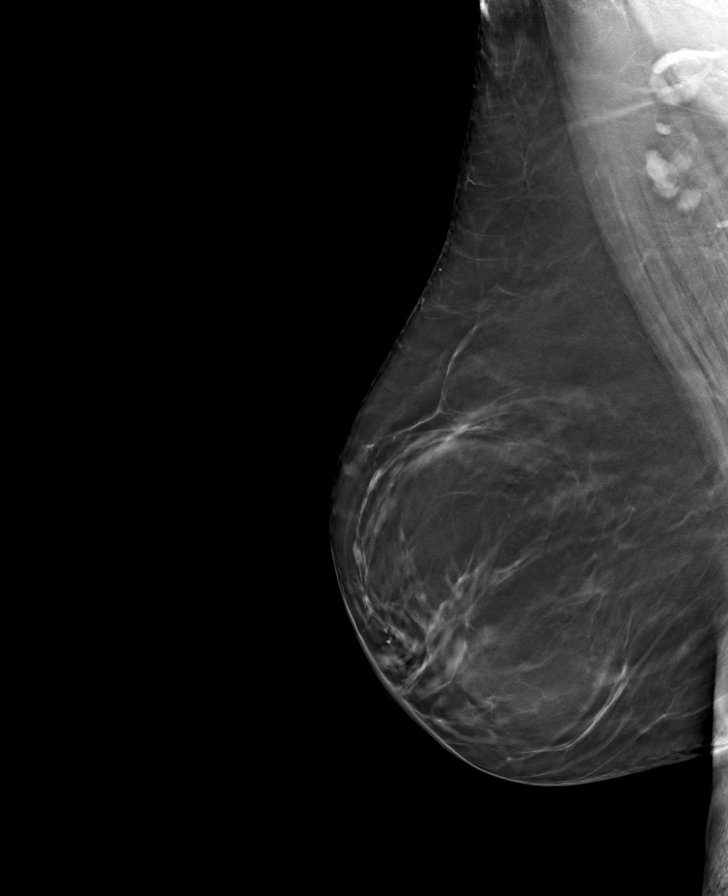

[L MLO tomo · tomo slice 38/75.0]
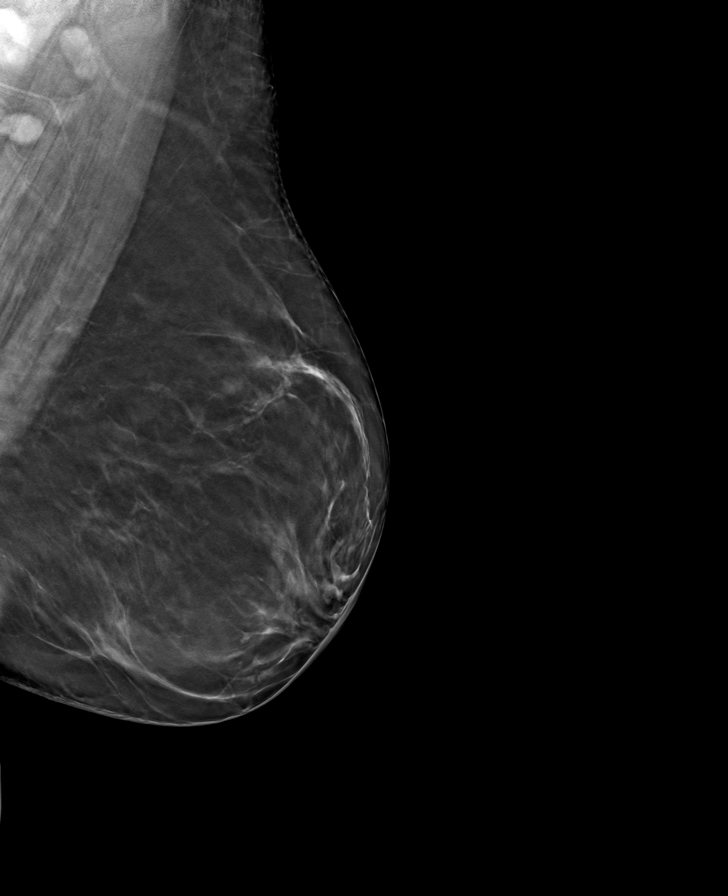

[R CC tomo · tomo slice 37/72.0]
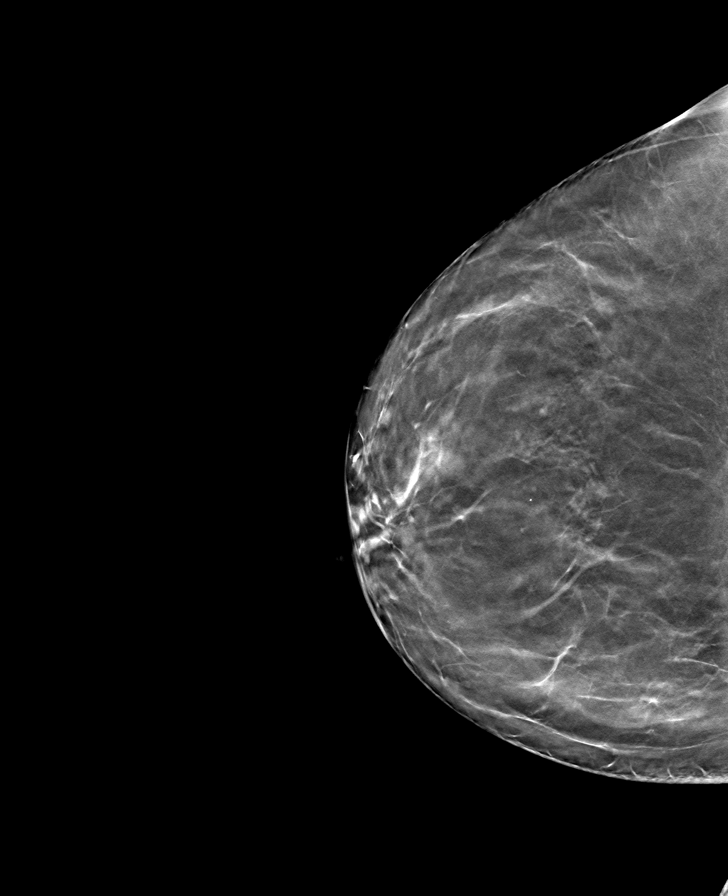

[8 of 24 positions shown; findings below may reference images not displayed]

ACR Breast Density Category b: There are scattered areas of
fibroglandular density.
FINDINGS: There are no findings suspicious for malignancy.
IMPRESSION: No mammographic evidence of malignancy. A result letter of this
screening mammogram will be mailed directly to the patient.

RECOMMENDATION:
Screening mammogram in one year. (Code:51-O-LD2)

BI-RADS CATEGORY  1: Negative.

## 2022-05-09 ENCOUNTER — Encounter: Payer: Self-pay | Admitting: Nurse Practitioner

## 2022-05-09 ENCOUNTER — Other Ambulatory Visit: Payer: Self-pay | Admitting: Nurse Practitioner

## 2022-05-09 ENCOUNTER — Ambulatory Visit (INDEPENDENT_AMBULATORY_CARE_PROVIDER_SITE_OTHER): Payer: Medicare Other | Admitting: Nurse Practitioner

## 2022-05-09 ENCOUNTER — Other Ambulatory Visit: Payer: Self-pay

## 2022-05-09 VITALS — BP 131/82 | HR 85 | Temp 97.9°F | Resp 20 | Ht 60.0 in | Wt 139.0 lb

## 2022-05-09 DIAGNOSIS — I10 Essential (primary) hypertension: Secondary | ICD-10-CM

## 2022-05-09 DIAGNOSIS — Z1231 Encounter for screening mammogram for malignant neoplasm of breast: Secondary | ICD-10-CM

## 2022-05-09 DIAGNOSIS — E785 Hyperlipidemia, unspecified: Secondary | ICD-10-CM

## 2022-05-09 DIAGNOSIS — R300 Dysuria: Secondary | ICD-10-CM

## 2022-05-09 DIAGNOSIS — Z23 Encounter for immunization: Secondary | ICD-10-CM

## 2022-05-09 DIAGNOSIS — F5101 Primary insomnia: Secondary | ICD-10-CM | POA: Diagnosis not present

## 2022-05-09 DIAGNOSIS — E559 Vitamin D deficiency, unspecified: Secondary | ICD-10-CM

## 2022-05-09 DIAGNOSIS — G43109 Migraine with aura, not intractable, without status migrainosus: Secondary | ICD-10-CM

## 2022-05-09 DIAGNOSIS — M545 Low back pain, unspecified: Secondary | ICD-10-CM

## 2022-05-09 LAB — URINALYSIS, COMPLETE
Bilirubin, UA: NEGATIVE
Glucose, UA: NEGATIVE
Ketones, UA: NEGATIVE
Nitrite, UA: NEGATIVE
Protein,UA: NEGATIVE
Specific Gravity, UA: 1.01 (ref 1.005–1.030)
Urobilinogen, Ur: 0.2 mg/dL (ref 0.2–1.0)
pH, UA: 6 (ref 5.0–7.5)

## 2022-05-09 LAB — MICROSCOPIC EXAMINATION: Renal Epithel, UA: NONE SEEN /hpf

## 2022-05-09 MED ORDER — ZOLPIDEM TARTRATE 10 MG PO TABS: 10.0000 mg | ORAL_TABLET | Freq: Every evening | ORAL | 1 refills | Status: DC | PRN

## 2022-05-09 MED ORDER — PREDNISONE 20 MG PO TABS: 40.0000 mg | ORAL_TABLET | Freq: Every day | ORAL | 0 refills | Status: AC

## 2022-05-09 MED ORDER — ATORVASTATIN CALCIUM 40 MG PO TABS: 40.0000 mg | ORAL_TABLET | Freq: Every day | ORAL | 1 refills | Status: DC

## 2022-05-09 MED ORDER — CYCLOBENZAPRINE HCL 10 MG PO TABS: 10.0000 mg | ORAL_TABLET | Freq: Three times a day (TID) | ORAL | 1 refills | Status: DC | PRN

## 2022-05-09 MED ORDER — FENOFIBRATE 145 MG PO TABS: 145.0000 mg | ORAL_TABLET | Freq: Every day | ORAL | 1 refills | Status: DC

## 2022-05-09 MED ORDER — AMLODIPINE BESYLATE 5 MG PO TABS: 5.0000 mg | ORAL_TABLET | Freq: Every day | ORAL | 1 refills | Status: DC

## 2022-05-09 NOTE — Progress Notes (Signed)
Subjective:    Patient ID: [name], female    DOB: 07-Feb-1961, 61 y.o.   MRN: [id number]   Chief Complaint: medical management of chronic issues     HPI:  [name] is a 61 y.o. who identifies as a female who was assigned female at birth.   Social history: Lives with: by herself Work history: retired   Scientist, forensic in today for follow up of the following chronic medical issues:  1. Hyperlipidemia with target LDL less than 100 Does not watch diet and does no dedicated exercise. Lab Results  Component Value Date   CHOL 192 11/06/2021   HDL 26 (L) 11/06/2021   LDLCALC 110 (H) 11/06/2021   TRIG 324 (H) 11/06/2021   CHOLHDL 7.4 (H) 11/06/2021     2. Essential hypertension, benign No c/o chest pain, sob or headache. Does not check blood pressure at home. BP Readings from Last 3 Encounters:  05/09/22 131/82  11/06/21 (!) 144/82  08/02/21 134/85      3. Migraine with aura and without status migrainosus, not intractable Have not had any since last visit  4. Primary insomnia Is on ambien and sleep swell. Sleeps about 7-8 hours a night.  5. Vitamin D deficiency Is suppose to be on daily vitamin d supplement. Last vitamin D Lab Results  Component Value Date   VD25OH 26.2 (L) 12/10/2014      New complaints: Went to urgent care several weeks ago and was told she had a bacteria on her urine. They did not put her antibiotic. She denies any urinary symptoms. Was treated for back pain. She is still having back pain. Rates pain 8/10. Nothing makes it worse. Laying down flat helps just for a little bit.  Allergies  Allergen Reactions   Baclofen Nausea And Vomiting   Outpatient Encounter Medications as of 05/09/2022  Medication Sig   amLODipine (NORVASC) 5 MG tablet Take 1 tablet (5 mg total) by mouth daily.   aspirin 81 MG chewable tablet Chew 81 mg by mouth daily.   atorvastatin (LIPITOR) 40 MG tablet Take 1 tablet (40 mg total) by mouth daily.    celecoxib (CELEBREX) 200 MG capsule Take 1 capsule (200 mg total) by mouth 2 (two) times daily.   fenofibrate (TRICOR) 145 MG tablet Take 1 tablet (145 mg total) by mouth daily.   ibuprofen (ADVIL) 200 MG tablet Take 800 mg by mouth every 8 (eight) hours as needed.   montelukast (SINGULAIR) 10 MG tablet Take 1 tablet (10 mg total) by mouth at bedtime.   zolpidem (AMBIEN) 10 MG tablet Take 1 tablet (10 mg total) by mouth at bedtime as needed. for sleep   No facility-administered encounter medications on file as of 05/09/2022.    Past Surgical History:  Procedure Laterality Date   CHOLECYSTECTOMY     TUBAL LIGATION      Family History  Problem Relation Age of Onset   Diabetes Mother    Heart disease Father    Diabetes Father    Healthy Sister    Healthy Brother    Healthy Sister       Controlled substance contract: n/a     Review of Systems  Constitutional:  Negative for diaphoresis.  Eyes:  Negative for pain.  Respiratory:  Negative for shortness of breath.   Cardiovascular:  Negative for chest pain, palpitations and leg swelling.  Gastrointestinal:  Negative for abdominal pain.  Endocrine: Negative for polydipsia.  Skin:  Negative for rash.  Neurological:  Negative for dizziness, weakness and headaches.  Hematological:  Does not bruise/bleed easily.  All other systems reviewed and are negative.      Objective:   Physical Exam Vitals and nursing note reviewed.  Constitutional:      General: She is not in acute distress.    Appearance: Normal appearance. She is well-developed.  HENT:     Head: Normocephalic.     Right Ear: Tympanic membrane normal.     Left Ear: Tympanic membrane normal.     Nose: Nose normal.     Mouth/Throat:     Mouth: Mucous membranes are moist.  Eyes:     Pupils: Pupils are equal, round, and reactive to light.  Neck:     Vascular: No carotid bruit or JVD.  Cardiovascular:     Rate and Rhythm: Normal rate and regular rhythm.     Heart  sounds: Normal heart sounds.  Pulmonary:     Effort: Pulmonary effort is normal. No respiratory distress.     Breath sounds: Normal breath sounds. No wheezing or rales.  Chest:     Chest wall: No tenderness.  Abdominal:     General: Bowel sounds are normal. There is no distension or abdominal bruit.     Palpations: Abdomen is soft. There is no hepatomegaly, splenomegaly, mass or pulsatile mass.     Tenderness: There is no abdominal tenderness.  Musculoskeletal:        General: Normal range of motion.     Cervical back: Normal range of motion and neck supple.     Comments: FROM of lumbar spine with pain on flexion and extension (-) SLR bil  Lymphadenopathy:     Cervical: No cervical adenopathy.  Skin:    General: Skin is warm and dry.  Neurological:     Mental Status: She is alert and oriented to person, place, and time.     Deep Tendon Reflexes: Reflexes are normal and symmetric.  Psychiatric:        Behavior: Behavior normal.        Thought Content: Thought content normal.        Judgment: Judgment normal.    BP 131/82   Pulse 85   Temp 97.9 F (36.6 C) (Temporal)   Resp 20   Ht 5' (1.524 m)   Wt 139 lb (63 kg)   LMP 07/17/2012   SpO2 97%   BMI 27.15 kg/m         Assessment & Plan:  [name] comes in today with chief complaint of Medical Management of Chronic Issues   Diagnosis and orders addressed:  1. Hyperlipidemia with target LDL less than 100 Low fat diet - Lipid panel - atorvastatin (LIPITOR) 40 MG tablet; Take 1 tablet (40 mg total) by mouth daily.  Dispense: 90 tablet; Refill: 1 - fenofibrate (TRICOR) 145 MG tablet; Take 1 tablet (145 mg total) by mouth daily.  Dispense: 90 tablet; Refill: 1  2. Essential hypertension, benign Low sodium diet - CMP14+EGFR - CBC with Differential/Platelet - amLODipine (NORVASC) 5 MG tablet; Take 1 tablet (5 mg total) by mouth daily.  Dispense: 90 tablet; Refill: 1  3. Migraine with aura and without status  migrainosus, not intractable Avoid caffeine  4. Primary insomnia Bedtime routine - zolpidem (AMBIEN) 10 MG tablet; Take 1 tablet (10 mg total) by mouth at bedtime as needed. for sleep  Dispense: 90 tablet; Refill: 1  5. Vitamin D deficiency Daily vitamin d supplement  6.  Dysuria Urine clear - Urinalysis, Complete - Urine Culture  7. Acute midline low back pain without sciatica Moist heat  Back stretches Flexeril at bedtime Prednisone as prescribed   Labs pending Health Maintenance reviewed Diet and exercise encouraged  Follow up plan: 6 months   [place], FNP

## 2022-05-09 NOTE — Patient Instructions (Signed)

## 2022-05-10 LAB — CMP14+EGFR
ALT: 33 IU/L — ABNORMAL HIGH (ref 0–32)
AST: 33 IU/L (ref 0–40)
Albumin/Globulin Ratio: 2 (ref 1.2–2.2)
Albumin: 4.4 g/dL (ref 3.8–4.9)
Alkaline Phosphatase: 76 IU/L (ref 44–121)
BUN/Creatinine Ratio: 15 (ref 12–28)
BUN: 12 mg/dL (ref 8–27)
Bilirubin Total: 0.2 mg/dL (ref 0.0–1.2)
CO2: 24 mmol/L (ref 20–29)
Calcium: 9.6 mg/dL (ref 8.7–10.3)
Chloride: 105 mmol/L (ref 96–106)
Creatinine, Ser: 0.78 mg/dL (ref 0.57–1.00)
Globulin, Total: 2.2 g/dL (ref 1.5–4.5)
Glucose: 132 mg/dL — ABNORMAL HIGH (ref 70–99)
Potassium: 3.5 mmol/L (ref 3.5–5.2)
Sodium: 143 mmol/L (ref 134–144)
Total Protein: 6.6 g/dL (ref 6.0–8.5)
eGFR: 87 mL/min/{1.73_m2} (ref >59)

## 2022-05-10 LAB — LIPID PANEL
Chol/HDL Ratio: 5.2 ratio — ABNORMAL HIGH (ref 0.0–4.4)
Cholesterol, Total: 145 mg/dL (ref 100–199)
HDL: 28 mg/dL — ABNORMAL LOW (ref >39)
LDL Chol Calc (NIH): 87 mg/dL (ref 0–99)
Triglycerides: 170 mg/dL — ABNORMAL HIGH (ref 0–149)
VLDL Cholesterol Cal: 30 mg/dL (ref 5–40)

## 2022-05-10 LAB — CBC WITH DIFFERENTIAL/PLATELET
Basophils Absolute: 0.1 10*3/uL (ref 0.0–0.2)
Basos: 1 %
EOS (ABSOLUTE): 0.2 10*3/uL (ref 0.0–0.4)
Eos: 3 %
Hematocrit: 43.9 % (ref 34.0–46.6)
Hemoglobin: 14.8 g/dL (ref 11.1–15.9)
Immature Grans (Abs): 0 10*3/uL (ref 0.0–0.1)
Immature Granulocytes: 0 %
Lymphocytes Absolute: 1.8 10*3/uL (ref 0.7–3.1)
Lymphs: 27 %
MCH: 30.6 pg (ref 26.6–33.0)
MCHC: 33.7 g/dL (ref 31.5–35.7)
MCV: 91 fL (ref 79–97)
Monocytes Absolute: 0.3 10*3/uL (ref 0.1–0.9)
Monocytes: 5 %
Neutrophils Absolute: 4.2 10*3/uL (ref 1.4–7.0)
Neutrophils: 64 %
Platelets: 264 10*3/uL (ref 150–450)
RBC: 4.83 x10E6/uL (ref 3.77–5.28)
RDW: 12.6 % (ref 11.7–15.4)
WBC: 6.6 10*3/uL (ref 3.4–10.8)

## 2022-05-12 LAB — URINE CULTURE

## 2022-05-13 ENCOUNTER — Ambulatory Visit
Admission: RE | Admit: 2022-05-13 | Discharge: 2022-05-13 | Disposition: A | Discharge status: Home / Self Care | Payer: Medicare Other | Source: Ambulatory Visit | Attending: Nurse Practitioner | Admitting: Nurse Practitioner

## 2022-05-13 DIAGNOSIS — Z1231 Encounter for screening mammogram for malignant neoplasm of breast: Secondary | ICD-10-CM

## 2022-05-13 LAB — HGB A1C W/O EAG: Hgb A1c MFr Bld: 7 % — ABNORMAL HIGH (ref 4.8–5.6)

## 2022-05-13 LAB — SPECIMEN STATUS REPORT

## 2022-06-09 ENCOUNTER — Other Ambulatory Visit: Payer: Self-pay | Admitting: Nurse Practitioner

## 2022-06-09 DIAGNOSIS — I10 Essential (primary) hypertension: Secondary | ICD-10-CM

## 2022-07-11 ENCOUNTER — Other Ambulatory Visit: Payer: Self-pay | Admitting: Nurse Practitioner

## 2022-07-11 DIAGNOSIS — M1990 Unspecified osteoarthritis, unspecified site: Secondary | ICD-10-CM

## 2022-11-05 NOTE — Progress Notes (Unsigned)
Subjective:   [name] is a 62 y.o. female who presents for Medicare Annual (Subsequent) preventive examination.  Review of Systems    ***       Objective:    There were no vitals filed for this visit. There is no height or weight on file to calculate BMI.     11/02/2021    2:12 PM 11/01/2020    2:51 PM 08/08/2020    5:48 PM  Advanced Directives  Does Patient Have a Medical Advance Directive? No No No  Would patient like information on creating a medical advance directive? No - Patient declined Yes (MAU/Ambulatory/Procedural Areas - Information given)     Current Medications (verified) Outpatient Encounter Medications as of 11/06/2022  Medication Sig   amLODipine (NORVASC) 5 MG tablet Take 1 tablet (5 mg total) by mouth daily.   aspirin 81 MG chewable tablet Chew 81 mg by mouth daily.   atorvastatin (LIPITOR) 40 MG tablet Take 1 tablet (40 mg total) by mouth daily.   celecoxib (CELEBREX) 200 MG capsule Take 1 capsule by mouth twice daily   cyclobenzaprine (FLEXERIL) 10 MG tablet Take 1 tablet (10 mg total) by mouth 3 (three) times daily as needed for muscle spasms.   fenofibrate (TRICOR) 145 MG tablet Take 1 tablet (145 mg total) by mouth daily.   ibuprofen (ADVIL) 200 MG tablet Take 800 mg by mouth every 8 (eight) hours as needed.   montelukast (SINGULAIR) 10 MG tablet Take 1 tablet (10 mg total) by mouth at bedtime.   zolpidem (AMBIEN) 10 MG tablet Take 1 tablet (10 mg total) by mouth at bedtime as needed. for sleep   No facility-administered encounter medications on file as of 11/06/2022.    Allergies (verified) Baclofen   History: Past Medical History:  Diagnosis Date   HTN (hypertension)    Hyperlipidemia    Migraines    Past Surgical History:  Procedure Laterality Date   CHOLECYSTECTOMY     TUBAL LIGATION     Family History  Problem Relation Age of Onset   Diabetes Mother    Heart disease Father    Diabetes Father    Healthy Sister    Healthy  Sister    Healthy Brother    Breast cancer Other    Social History   Socioeconomic History   Marital status: Divorced    Spouse name: Not on file   Number of children: 3   Years of education: Not on file   Highest education level: Not on file  Occupational History   Occupation: disability  Tobacco Use   Smoking status: Every Day    Packs/day: 1.00    Years: 44.00    Additional pack years: 0.00    Total pack years: 44.00    Types: Cigarettes    Start date: 10/02/1983   Smokeless tobacco: Never   Tobacco comments:    Has been smoking 0.5-1 ppd since age 89  Vaping Use   Vaping Use: Never used  Substance and Sexual Activity   Alcohol use: No   Drug use: No   Sexual activity: Not on file  Other Topics Concern   Not on file  Social History Narrative   Lives alone, one child in [place], one in [place], son in [place]; parents live 2 miles away   Social Determinants of Health   Financial Resource Strain: Medium Risk (11/02/2021)   Overall Financial Resource Strain (CARDIA)    Difficulty of Paying Living Expenses: Somewhat hard  Food Insecurity: Food Insecurity Present (11/02/2021)   Hunger Vital Sign    Worried About Running Out of Food in the Last Year: Sometimes true    Ran Out of Food in the Last Year: Sometimes true  Transportation Needs: No Transportation Needs (11/02/2021)   PRAPARE - Administrator, Civil Service (Medical): No    Lack of Transportation (Non-Medical): No  Physical Activity: Sufficiently Active (11/02/2021)   Exercise Vital Sign    Days of Exercise per Week: 7 days    Minutes of Exercise per Session: 60 min  Stress: No Stress Concern Present (11/02/2021)   [employer] of Occupational Health - Occupational Stress Questionnaire    Feeling of Stress : Not at all  Social Connections: Socially Isolated (11/02/2021)   Social Connection and Isolation Panel [NHANES]    Frequency of Communication with Friends and Family: More than three  times a week    Frequency of Social Gatherings with Friends and Family: More than three times a week    Attends Religious Services: Never    Database administrator or Organizations: No    Attends Engineer, structural: Never    Marital Status: Divorced    Tobacco Counseling Ready to quit: Not Answered Counseling given: Not Answered Tobacco comments: Has been smoking 0.5-1 ppd since age 8   Clinical Intake:                 Diabetic?No          Activities of Daily Living     No data to display          Patient Care Team: Bennie Pierini, FNP as PCP - General (Family Medicine) Conley Rolls, Westley Chandler, MD as Referring Physician (Optometry) [facility]  Indicate any recent Medical Services you may have received from other than [facility] providers in the past year (date may be approximate).     Assessment:   This is a routine wellness examination for [place].  Hearing/Vision screen No results found.  Dietary issues and exercise activities discussed:     Goals Addressed   None    Depression Screen    05/09/2022    2:15 PM 11/02/2021    2:10 PM 05/09/2021    3:35 PM 11/09/2020    9:58 AM 11/01/2020    2:46 PM 05/18/2020    2:30 PM 04/11/2020    9:14 AM  PHQ 2/9 Scores  PHQ - 2 Score 0 0 0 0 0 0 0  PHQ- 9 Score 0  0        Fall Risk    05/09/2022    2:14 PM 11/02/2021    2:03 PM 05/09/2021    3:35 PM 11/09/2020    9:58 AM 11/01/2020    2:51 PM  Fall Risk   Falls in the past year? 0 1 0 0 0  Number falls in past yr:  0   0  Injury with Fall?  0   0  Risk for fall due to :  History of fall(s);Impaired balance/gait;Orthopedic patient   Orthopedic patient;Impaired balance/gait  Follow up  Education provided;Falls prevention discussed   Falls prevention discussed    FALL RISK PREVENTION PERTAINING TO THE HOME:  Any stairs in or around the home? {YES/NO:21197} If so, are there any without handrails? {YES/NO:21197} Home free of  loose throw rugs in walkways, pet beds, electrical cords, etc? {YES/NO:21197} Adequate lighting in your home to reduce risk of falls? {YES/NO:21197}  ASSISTIVE DEVICES UTILIZED TO PREVENT FALLS:  Life alert? {YES/NO:21197} Use of a cane, walker or w/c? {YES/NO:21197} Grab bars in the bathroom? {YES/NO:21197} Shower chair or bench in shower? {YES/NO:21197} Elevated toilet seat or a handicapped toilet? {YES/NO:21197}  TIMED UP AND GO:  Was the test performed? No . Telephonic visit   Cognitive Function:        11/02/2021    2:17 PM  6CIT Screen  What Year? 0 points  What month? 0 points  What time? 0 points  Count back from 20 0 points  Months in reverse 0 points  Repeat phrase 0 points  Total Score 0 points    Immunizations Immunization History  Administered Date(s) Administered   Hepatitis A 09/15/2008, 10/17/2008   Hepatitis B 09/15/2008, 10/17/2008   Influenza,inj,Quad PF,6+ Mos 05/09/2021, 05/09/2022   MMR 07/29/2008   Moderna Sars-Covid-2 Vaccination 10/17/2019, 11/30/2019, 01/04/2020   Td 10/28/2007   Tdap 10/28/2007, 11/29/2020    TDAP status: Up to date  Flu Vaccine status: Up to date  Covid-19 vaccine status: Information provided on how to obtain vaccines.   Qualifies for Shingles Vaccine? Yes   Zostavax completed No   Shingrix Completed?: No.    Education has been provided regarding the importance of this vaccine. Patient has been advised to call insurance company to determine out of pocket expense if they have not yet received this vaccine. Advised may also receive vaccine at local pharmacy or Health Dept. Verbalized acceptance and understanding.  Screening Tests Health Maintenance  Topic Date Due   Hepatitis C Screening  Never done   Lung Cancer Screening  Never done   Zoster Vaccines- Shingrix (1 of 2) Never done   COVID-19 Vaccine (4 - 2023-24 season) 03/08/2022   PAP SMEAR-Modifier  08/12/2022   Medicare Annual Wellness (AWV)  11/03/2022    INFLUENZA VACCINE  02/06/2023   MAMMOGRAM  05/14/2023   Fecal DNA (Cologuard)  11/13/2023   DTaP/Tdap/Td (4 - Td or Tdap) 11/30/2030   HIV Screening  Completed   HPV VACCINES  Aged Out    Health Maintenance  Health Maintenance Due  Topic Date Due   Hepatitis C Screening  Never done   Lung Cancer Screening  Never done   Zoster Vaccines- Shingrix (1 of 2) Never done   COVID-19 Vaccine (4 - 2023-24 season) 03/08/2022   PAP SMEAR-Modifier  08/12/2022   Medicare Annual Wellness (AWV)  11/03/2022    Colorectal cancer screening: Type of screening: Cologuard. Completed 11/12/20. Repeat every 3 years  Mammogram status: Completed 05/13/22. Repeat every year  Lung Cancer Screening: (Low Dose CT Chest recommended if Age 50-80 years, 30 pack-year currently smoking OR have quit w/in 15years.) does not qualify.   Lung Cancer Screening Referral: n/a  Additional Screening:  Hepatitis C Screening: {DOES NOT does:27190::"does not"} qualify; Completed ***  Vision Screening: Recommended annual ophthalmology exams for early detection of glaucoma and other disorders of the eye. Is the patient up to date with their annual eye exam?  {YES/NO:21197} Who is the provider or what is the name of the office in which the patient attends annual eye exams? *** If pt is not established with a provider, would they like to be referred to a provider to establish care? {YES/NO:21197}.   Dental Screening: Recommended annual dental exams for proper oral hygiene  Community Resource Referral / Chronic Care Management: CRR required this visit?  {YES/NO:21197}  CCM required this visit?  {YES/NO:21197}     Plan:  I have personally reviewed and noted the following in the patient's chart:   Medical and social history Use of alcohol, tobacco or illicit drugs  Current medications and supplements including opioid prescriptions. {Opioid Prescriptions:[phone]} Functional ability and status Nutritional  status Physical activity Advanced directives List of other physicians Hospitalizations, surgeries, and ER visits in previous 12 months Vitals Screenings to include cognitive, depression, and falls Referrals and appointments  In addition, I have reviewed and discussed with patient certain preventive protocols, quality metrics, and best practice recommendations. A written personalized care plan for preventive services as well as general preventive health recommendations were provided to patient.     Durwin Nora, [place]   10/14/8117   Due to this being a virtual visit, the after visit summary with patients personalized plan was offered to patient via mail or my-chart. ***Patient declined at this time./ Patient would like to access on my-chart/ per request, patient was mailed a copy of AVS./ Patient preferred to pick up at office at next visit  Nurse Notes: ***

## 2022-11-05 NOTE — Patient Instructions (Signed)
[name] , Thank you for taking time to come for your Medicare Wellness Visit. I appreciate your ongoing commitment to your health goals. Please review the following plan we discussed and let me know if I can assist you in the future.   These are the goals we discussed:  Goals      Exercise 3x per week (30 min per time)     Be more active Wants to be pain free by next year  Goals Addressed             This Visit's Progress    Exercise 3x per week (30 min per time)   On track    Be more active Wants to be pain free by next year                 This is a list of the screening recommended for you and due dates:  Health Maintenance  Topic Date Due   Hepatitis C Screening: USPSTF Recommendation to screen - Ages 67-79 yo.  Never done   Zoster (Shingles) Vaccine (1 of 2) Never done   COVID-19 Vaccine (4 - 2023-24 season) 03/08/2022   Pap Smear  08/12/2022   Screening for Lung Cancer  03/09/2023*   Flu Shot  02/06/2023   Mammogram  05/14/2023   Medicare Annual Wellness Visit  11/06/2023   Cologuard (Stool DNA test)  11/13/2023   DTaP/Tdap/Td vaccine (4 - Td or Tdap) 11/30/2030   HIV Screening  Completed   HPV Vaccine  Aged Out  *Topic was postponed. The date shown is not the original due date.    Advanced directives: Forms are available if you choose in the future to pursue completion.  This is recommended in order to make sure that your health wishes are honored in the event that you are unable to verbalize them to the provider.    Conditions/risks identified: Aim for 30 minutes of exercise or brisk walking, 6-8 glasses of water, and 5 servings of fruits and vegetables each day.   Next appointment: Follow up in one year for your annual wellness visit.   Preventive Care 40-64 Years, Female Preventive care refers to lifestyle choices and visits with your health care provider that can promote health and wellness. What does preventive care include? A yearly physical  exam. This is also called an annual well check. Dental exams once or twice a year. Routine eye exams. Ask your health care provider how often you should have your eyes checked. Personal lifestyle choices, including: Daily care of your teeth and gums. Regular physical activity. Eating a healthy diet. Avoiding tobacco and drug use. Limiting alcohol use. Practicing safe sex. Taking low-dose aspirin daily starting at age 58. Taking vitamin and mineral supplements as recommended by your health care provider. What happens during an annual well check? The services and screenings done by your health care provider during your annual well check will depend on your age, overall health, lifestyle risk factors, and family history of disease. Counseling  Your health care provider may ask you questions about your: Alcohol use. Tobacco use. Drug use. Emotional well-being. Home and relationship well-being. Sexual activity. Eating habits. Work and work Astronomer. Method of birth control. Menstrual cycle. Pregnancy history. Screening  You may have the following tests or measurements: Height, weight, and BMI. Blood pressure. Lipid and cholesterol levels. These may be checked every 5 years, or more frequently if you are over 68 years old. Skin check. Lung cancer screening. You may have  this screening every year starting at age 71 if you have a 30-pack-year history of smoking and currently smoke or have quit within the past 15 years. Fecal occult blood test (FOBT) of the stool. You may have this test every year starting at age 80. Flexible sigmoidoscopy or colonoscopy. You may have a sigmoidoscopy every 5 years or a colonoscopy every 10 years starting at age 65. Hepatitis C blood test. Hepatitis B blood test. Sexually transmitted disease (STD) testing. Diabetes screening. This is done by checking your blood sugar (glucose) after you have not eaten for a while (fasting). You may have this done  every 1-3 years. Mammogram. This may be done every 1-2 years. Talk to your health care provider about when you should start having regular mammograms. This may depend on whether you have a family history of breast cancer. BRCA-related cancer screening. This may be done if you have a family history of breast, ovarian, tubal, or peritoneal cancers. Pelvic exam and Pap test. This may be done every 3 years starting at age 26. Starting at age 9, this may be done every 5 years if you have a Pap test in combination with an HPV test. Bone density scan. This is done to screen for osteoporosis. You may have this scan if you are at high risk for osteoporosis. Discuss your test results, treatment options, and if necessary, the need for more tests with your health care provider. Vaccines  Your health care provider may recommend certain vaccines, such as: Influenza vaccine. This is recommended every year. Tetanus, diphtheria, and acellular pertussis (Tdap, Td) vaccine. You may need a Td booster every 10 years. Zoster vaccine. You may need this after age 82. Pneumococcal 13-valent conjugate (PCV13) vaccine. You may need this if you have certain conditions and were not previously vaccinated. Pneumococcal polysaccharide (PPSV23) vaccine. You may need one or two doses if you smoke cigarettes or if you have certain conditions. Talk to your health care provider about which screenings and vaccines you need and how often you need them. This information is not intended to replace advice given to you by your health care provider. Make sure you discuss any questions you have with your health care provider. Document Released: 07/21/2015 Document Revised: 03/13/2016 Document Reviewed: 04/25/2015 Elsevier Interactive Patient Education  2017 [employer].    Fall Prevention in the Home Falls can cause injuries. They can happen to people of all ages. There are many things you can do to make your home safe and to help  prevent falls. What can I do on the outside of my home? Regularly fix the edges of walkways and driveways and fix any cracks. Remove anything that might make you trip as you walk through a door, such as a raised step or threshold. Trim any bushes or trees on the path to your home. Use bright outdoor lighting. Clear any walking paths of anything that might make someone trip, such as rocks or tools. Regularly check to see if handrails are loose or broken. Make sure that both sides of any steps have handrails. Any raised decks and porches should have guardrails on the edges. Have any leaves, snow, or ice cleared regularly. Use sand or salt on walking paths during winter. Clean up any spills in your garage right away. This includes oil or grease spills. What can I do in the bathroom? Use night lights. Install grab bars by the toilet and in the tub and shower. Do not use towel bars as grab bars.  Use non-skid mats or decals in the tub or shower. If you need to sit down in the shower, use a plastic, non-slip stool. Keep the floor dry. Clean up any water that spills on the floor as soon as it happens. Remove soap buildup in the tub or shower regularly. Attach bath mats securely with double-sided non-slip rug tape. Do not have throw rugs and other things on the floor that can make you trip. What can I do in the bedroom? Use night lights. Make sure that you have a light by your bed that is easy to reach. Do not use any sheets or blankets that are too big for your bed. They should not hang down onto the floor. Have a firm chair that has side arms. You can use this for support while you get dressed. Do not have throw rugs and other things on the floor that can make you trip. What can I do in the kitchen? Clean up any spills right away. Avoid walking on wet floors. Keep items that you use a lot in easy-to-reach places. If you need to reach something above you, use a strong step stool that has a  grab bar. Keep electrical cords out of the way. Do not use floor polish or wax that makes floors slippery. If you must use wax, use non-skid floor wax. Do not have throw rugs and other things on the floor that can make you trip. What can I do with my stairs? Do not leave any items on the stairs. Make sure that there are handrails on both sides of the stairs and use them. Fix handrails that are broken or loose. Make sure that handrails are as long as the stairways. Check any carpeting to make sure that it is firmly attached to the stairs. Fix any carpet that is loose or worn. Avoid having throw rugs at the top or bottom of the stairs. If you do have throw rugs, attach them to the floor with carpet tape. Make sure that you have a light switch at the top of the stairs and the bottom of the stairs. If you do not have them, ask someone to add them for you. What else can I do to help prevent falls? Wear shoes that: Do not have high heels. Have rubber bottoms. Are comfortable and fit you well. Are closed at the toe. Do not wear sandals. If you use a stepladder: Make sure that it is fully opened. Do not climb a closed stepladder. Make sure that both sides of the stepladder are locked into place. Ask someone to hold it for you, if possible. Clearly mark and make sure that you can see: Any grab bars or handrails. First and last steps. Where the edge of each step is. Use tools that help you move around (mobility aids) if they are needed. These include: Canes. Walkers. Scooters. Crutches. Turn on the lights when you go into a dark area. Replace any light bulbs as soon as they burn out. Set up your furniture so you have a clear path. Avoid moving your furniture around. If any of your floors are uneven, fix them. If there are any pets around you, be aware of where they are. Review your medicines with your doctor. Some medicines can make you feel dizzy. This can increase your chance of  falling. Ask your doctor what other things that you can do to help prevent falls. This information is not intended to replace advice given to you by your health  care provider. Make sure you discuss any questions you have with your health care provider. Document Released: 04/20/2009 Document Revised: 11/30/2015 Document Reviewed: 07/29/2014 Elsevier Interactive Patient Education  2017 [employer].

## 2022-11-06 ENCOUNTER — Ambulatory Visit (INDEPENDENT_AMBULATORY_CARE_PROVIDER_SITE_OTHER): Payer: Medicare Other

## 2022-11-06 VITALS — Ht 60.0 in | Wt 139.0 lb

## 2022-11-06 DIAGNOSIS — Z0000 Encounter for general adult medical examination without abnormal findings: Secondary | ICD-10-CM

## 2022-11-06 NOTE — Addendum Note (Signed)
Addended by: Kandis Fantasia B on: 11/06/2022 03:09 PM   Modules accepted: Orders

## 2022-11-07 ENCOUNTER — Ambulatory Visit (INDEPENDENT_AMBULATORY_CARE_PROVIDER_SITE_OTHER): Payer: Medicare Other | Admitting: Nurse Practitioner

## 2022-11-07 ENCOUNTER — Telehealth: Payer: Self-pay

## 2022-11-07 ENCOUNTER — Encounter: Payer: Self-pay | Admitting: Nurse Practitioner

## 2022-11-07 VITALS — BP 146/92 | HR 92 | Temp 97.1°F | Resp 20 | Ht 60.0 in | Wt 141.0 lb

## 2022-11-07 DIAGNOSIS — E559 Vitamin D deficiency, unspecified: Secondary | ICD-10-CM | POA: Diagnosis not present

## 2022-11-07 DIAGNOSIS — I10 Essential (primary) hypertension: Secondary | ICD-10-CM

## 2022-11-07 DIAGNOSIS — E785 Hyperlipidemia, unspecified: Secondary | ICD-10-CM

## 2022-11-07 DIAGNOSIS — F5101 Primary insomnia: Secondary | ICD-10-CM

## 2022-11-07 LAB — BAYER DCA HB A1C WAIVED: HB A1C (BAYER DCA - WAIVED): 6.2 % — ABNORMAL HIGH (ref 4.8–5.6)

## 2022-11-07 MED ORDER — ATORVASTATIN CALCIUM 40 MG PO TABS: 40.0000 mg | ORAL_TABLET | Freq: Every day | ORAL | 1 refills | Status: DC

## 2022-11-07 MED ORDER — ZOLPIDEM TARTRATE 10 MG PO TABS: 10.0000 mg | ORAL_TABLET | Freq: Every evening | ORAL | 1 refills | Status: DC | PRN

## 2022-11-07 MED ORDER — AMLODIPINE BESYLATE 5 MG PO TABS: 5.0000 mg | ORAL_TABLET | Freq: Every day | ORAL | 1 refills | Status: DC

## 2022-11-07 MED ORDER — FENOFIBRATE 145 MG PO TABS
145.0000 mg | ORAL_TABLET | Freq: Every day | ORAL | 1 refills | Status: DC
Start: 2022-11-07 — End: 2023-05-27

## 2022-11-07 NOTE — Telephone Encounter (Signed)
   Telephone encounter was:  Unsuccessful.  11/07/2022 Name: [name] MRN: [id number] DOB: 1960-12-02  Unsuccessful outbound call made today to assist with:  Food Insecurity  Outreach Attempt:  1st Attempt  A HIPAA compliant voice message was left requesting a return call.  Instructed patient to call back   Lenard Forth [facility] Guide, [facility] [phone] [address] [place], [place], [place] [address] Phone: [phone] Email: [email or web address]

## 2022-11-07 NOTE — Progress Notes (Signed)
Subjective:    Patient ID: [name], female    DOB: 1960/09/25, 62 y.o.   MRN: [id number]   Chief Complaint: medical management of chronic issues       HPI:  [name] is a 62 y.o. who identifies as a female who was assigned female at birth.   Social history: Lives with: her son lives with her Work history: retired   Water engineer in today for follow up of the following chronic medical issues:  1. Essential hypertension, benign No c/o chest pain, sob or headache. Does not check blood pressure at home. BP Readings from Last 3 Encounters:  05/09/22 131/82  11/06/21 (!) 144/82  08/02/21 134/85     2. Hyperlipidemia with target LDL less than 100 Does not really watch diet and does no exercise. But she does stay very active. Lab Results  Component Value Date   CHOL 145 05/09/2022   HDL 28 (L) 05/09/2022   LDLCALC 87 05/09/2022   TRIG 170 (H) 05/09/2022   CHOLHDL 5.2 (H) 05/09/2022     3. Primary insomnia Is on ambien to sleep at night  4. Vitamin D deficiency Is on daily vitamin d supplement Last vitamin D Lab Results  Component Value Date   VD25OH 26.2 (L) 12/10/2014     New complaints: None today  Allergies  Allergen Reactions   Baclofen Nausea And Vomiting   Outpatient Encounter Medications as of 11/07/2022  Medication Sig   amLODipine (NORVASC) 5 MG tablet Take 1 tablet (5 mg total) by mouth daily.   aspirin 81 MG chewable tablet Chew 81 mg by mouth daily.   atorvastatin (LIPITOR) 40 MG tablet Take 1 tablet (40 mg total) by mouth daily.   celecoxib (CELEBREX) 200 MG capsule Take 1 capsule by mouth twice daily   cyclobenzaprine (FLEXERIL) 10 MG tablet Take 1 tablet (10 mg total) by mouth 3 (three) times daily as needed for muscle spasms.   fenofibrate (TRICOR) 145 MG tablet Take 1 tablet (145 mg total) by mouth daily.   ibuprofen (ADVIL) 200 MG tablet Take 800 mg by mouth every 8 (eight) hours as needed.   montelukast (SINGULAIR) 10 MG  tablet Take 1 tablet (10 mg total) by mouth at bedtime.   zolpidem (AMBIEN) 10 MG tablet Take 1 tablet (10 mg total) by mouth at bedtime as needed. for sleep   No facility-administered encounter medications on file as of 11/07/2022.    Past Surgical History:  Procedure Laterality Date   CHOLECYSTECTOMY     TUBAL LIGATION      Family History  Problem Relation Age of Onset   Diabetes Mother    Heart disease Father    Diabetes Father    Healthy Sister    Healthy Sister    Healthy Brother    Breast cancer Other       Controlled substance contract: n/a     Review of Systems  Constitutional:  Negative for diaphoresis.  Eyes:  Negative for pain.  Respiratory:  Negative for shortness of breath.   Cardiovascular:  Negative for chest pain, palpitations and leg swelling.  Gastrointestinal:  Negative for abdominal pain.  Endocrine: Negative for polydipsia.  Skin:  Negative for rash.  Neurological:  Negative for dizziness, weakness and headaches.  Hematological:  Does not bruise/bleed easily.  All other systems reviewed and are negative.      Objective:   Physical Exam Vitals and nursing note reviewed.  Constitutional:      General:  She is not in acute distress.    Appearance: Normal appearance. She is well-developed.  HENT:     Head: Normocephalic.     Right Ear: Tympanic membrane normal.     Left Ear: Tympanic membrane normal.     Nose: Nose normal.     Mouth/Throat:     Mouth: Mucous membranes are moist.  Eyes:     Pupils: Pupils are equal, round, and reactive to light.  Neck:     Vascular: No carotid bruit or JVD.  Cardiovascular:     Rate and Rhythm: Normal rate and regular rhythm.     Heart sounds: Normal heart sounds.  Pulmonary:     Effort: Pulmonary effort is normal. No respiratory distress.     Breath sounds: Normal breath sounds. No wheezing or rales.  Chest:     Chest wall: No tenderness.  Abdominal:     General: Bowel sounds are normal. There is no  distension or abdominal bruit.     Palpations: Abdomen is soft. There is no hepatomegaly, splenomegaly, mass or pulsatile mass.     Tenderness: There is no abdominal tenderness.  Musculoskeletal:        General: Normal range of motion.     Cervical back: Normal range of motion and neck supple.  Lymphadenopathy:     Cervical: No cervical adenopathy.  Skin:    General: Skin is warm and dry.  Neurological:     Mental Status: She is alert and oriented to person, place, and time.     Deep Tendon Reflexes: Reflexes are normal and symmetric.  Psychiatric:        Behavior: Behavior normal.        Thought Content: Thought content normal.        Judgment: Judgment normal.    BP (!) 146/92   Pulse 92   Temp (!) 97.1 F (36.2 C) (Oral)   Resp 20   Ht 5' (1.524 m)   Wt 141 lb (64 kg)   LMP 07/17/2012   SpO2 98%   BMI 27.54 kg/m   Hgba1c 6.2%      Assessment & Plan:   HAIDE KLINKER comes in today with chief complaint of Medical Management of Chronic Issues   Diagnosis and orders addressed:  1. Essential hypertension, benign Low sodium diet - Bayer DCA Hb A1c Waived - CBC with Differential/Platelet - CMP14+EGFR - Lipid panel - amLODipine (NORVASC) 5 MG tablet; Take 1 tablet (5 mg total) by mouth daily.  Dispense: 90 tablet; Refill: 1  2. Hyperlipidemia with target LDL less than 100 Low fat diet - Bayer DCA Hb A1c Waived - CBC with Differential/Platelet - CMP14+EGFR - Lipid panel - atorvastatin (LIPITOR) 40 MG tablet; Take 1 tablet (40 mg total) by mouth daily.  Dispense: 90 tablet; Refill: 1 - fenofibrate (TRICOR) 145 MG tablet; Take 1 tablet (145 mg total) by mouth daily.  Dispense: 90 tablet; Refill: 1  3. Primary insomnia Bedtime routine - ToxASSURE Select 13 (MW), Urine - Bayer DCA Hb A1c Waived - CBC with Differential/Platelet - CMP14+EGFR - Lipid panel - zolpidem (AMBIEN) 10 MG tablet; Take 1 tablet (10 mg total) by mouth at bedtime as needed. for sleep   Dispense: 90 tablet; Refill: 1  4. Vitamin D deficiency Continue vitamin d supplement - Bayer DCA Hb A1c Waived - CBC with Differential/Platelet - CMP14+EGFR - Lipid panel   Labs pending Health Maintenance reviewed Diet and exercise encouraged  Follow up plan: 6 months  Mary-Margaret Hassell Done, FNP

## 2022-11-07 NOTE — Patient Instructions (Signed)
Insomnia Insomnia is a sleep disorder that makes it difficult to fall asleep or stay asleep. Insomnia can cause fatigue, low energy, difficulty concentrating, mood swings, and poor performance at work or school. There are three different ways to classify insomnia: Difficulty falling asleep. Difficulty staying asleep. Waking up too early in the morning. Any type of insomnia can be long-term (chronic) or short-term (acute). Both are common. Short-term insomnia usually lasts for 3 months or less. Chronic insomnia occurs at least three times a week for longer than 3 months. What are the causes? Insomnia may be caused by another condition, situation, or substance, such as: Having certain mental health conditions, such as anxiety and depression. Using caffeine, alcohol, tobacco, or drugs. Having gastrointestinal conditions, such as gastroesophageal reflux disease (GERD). Having certain medical conditions. These include: Asthma. Alzheimer's disease. Stroke. Chronic pain. An overactive thyroid gland (hyperthyroidism). Other sleep disorders, such as restless legs syndrome and sleep apnea. Menopause. Sometimes, the cause of insomnia may not be known. What increases the risk? Risk factors for insomnia include: Gender. Females are affected more often than males. Age. Insomnia is more common as people get older. Stress and certain medical and mental health conditions. Lack of exercise. Having an irregular work schedule. This may include working night shifts and traveling between different time zones. What are the signs or symptoms? If you have insomnia, the main symptom is having trouble falling asleep or having trouble staying asleep. This may lead to other symptoms, such as: Feeling tired or having low energy. Feeling nervous about going to sleep. Not feeling rested in the morning. Having trouble concentrating. Feeling irritable, anxious, or depressed. How is this diagnosed? This condition  may be diagnosed based on: Your symptoms and medical history. Your health care provider may ask about: Your sleep habits. Any medical conditions you have. Your mental health. A physical exam. How is this treated? Treatment for insomnia depends on the cause. Treatment may focus on treating an underlying condition that is causing the insomnia. Treatment may also include: Medicines to help you sleep. Counseling or therapy. Lifestyle adjustments to help you sleep better. Follow these instructions at home: Eating and drinking  Limit or avoid alcohol, caffeinated beverages, and products that contain nicotine and tobacco, especially close to bedtime. These can disrupt your sleep. Do not eat a large meal or eat spicy foods right before bedtime. This can lead to digestive discomfort that can make it hard for you to sleep. Sleep habits  Keep a sleep diary to help you and your health care provider figure out what could be causing your insomnia. Write down: When you sleep. When you wake up during the night. How well you sleep and how rested you feel the next day. Any side effects of medicines you are taking. What you eat and drink. Make your bedroom a dark, comfortable place where it is easy to fall asleep. Put up shades or blackout curtains to block light from outside. Use a white noise machine to block noise. Keep the temperature cool. Limit screen use before bedtime. This includes: Not watching TV. Not using your smartphone, tablet, or computer. Stick to a routine that includes going to bed and waking up at the same times every day and night. This can help you fall asleep faster. Consider making a quiet activity, such as reading, part of your nighttime routine. Try to avoid taking naps during the day so that you sleep better at night. Get out of bed if you are still awake after   15 minutes of trying to sleep. Keep the lights down, but try reading or doing a quiet activity. When you feel  sleepy, go back to bed. General instructions Take over-the-counter and prescription medicines only as told by your health care provider. Exercise regularly as told by your health care provider. However, avoid exercising in the hours right before bedtime. Use relaxation techniques to manage stress. Ask your health care provider to suggest some techniques that may work well for you. These may include: Breathing exercises. Routines to release muscle tension. Visualizing peaceful scenes. Make sure that you drive carefully. Do not drive if you feel very sleepy. Keep all follow-up visits. This is important. Contact a health care provider if: You are tired throughout the day. You have trouble in your daily routine due to sleepiness. You continue to have sleep problems, or your sleep problems get worse. Get help right away if: You have thoughts about hurting yourself or someone else. Get help right away if you feel like you may hurt yourself or others, or have thoughts about taking your own life. Go to your nearest emergency room or: Call 911. Call the National Suicide Prevention Lifeline at 1-800-273-8255 or 988. This is open 24 hours a day. Text the Crisis Text Line at 741741. Summary Insomnia is a sleep disorder that makes it difficult to fall asleep or stay asleep. Insomnia can be long-term (chronic) or short-term (acute). Treatment for insomnia depends on the cause. Treatment may focus on treating an underlying condition that is causing the insomnia. Keep a sleep diary to help you and your health care provider figure out what could be causing your insomnia. This information is not intended to replace advice given to you by your health care provider. Make sure you discuss any questions you have with your health care provider. Document Revised: 06/04/2021 Document Reviewed: 06/04/2021 Elsevier Patient Education  2023 Elsevier Inc.  

## 2022-11-08 ENCOUNTER — Telehealth: Payer: Self-pay

## 2022-11-08 LAB — CMP14+EGFR
ALT: 32 IU/L (ref 0–32)
AST: 47 IU/L — ABNORMAL HIGH (ref 0–40)
Albumin/Globulin Ratio: 1.9 (ref 1.2–2.2)
Albumin: 4.3 g/dL (ref 3.8–4.9)
Alkaline Phosphatase: 91 IU/L (ref 44–121)
BUN/Creatinine Ratio: 18 (ref 12–28)
BUN: 12 mg/dL (ref 8–27)
Bilirubin Total: 0.3 mg/dL (ref 0.0–1.2)
CO2: 20 mmol/L (ref 20–29)
Calcium: 9.7 mg/dL (ref 8.7–10.3)
Chloride: 107 mmol/L — ABNORMAL HIGH (ref 96–106)
Creatinine, Ser: 0.66 mg/dL (ref 0.57–1.00)
Globulin, Total: 2.3 g/dL (ref 1.5–4.5)
Glucose: 151 mg/dL — ABNORMAL HIGH (ref 70–99)
Potassium: 4 mmol/L (ref 3.5–5.2)
Sodium: 144 mmol/L (ref 134–144)
Total Protein: 6.6 g/dL (ref 6.0–8.5)
eGFR: 100 mL/min/{1.73_m2} (ref >59)

## 2022-11-08 LAB — CBC WITH DIFFERENTIAL/PLATELET
Basophils Absolute: 0.1 10*3/uL (ref 0.0–0.2)
Basos: 1 %
EOS (ABSOLUTE): 0.2 10*3/uL (ref 0.0–0.4)
Eos: 3 %
Hematocrit: 43.7 % (ref 34.0–46.6)
Hemoglobin: 14.9 g/dL (ref 11.1–15.9)
Immature Grans (Abs): 0 10*3/uL (ref 0.0–0.1)
Immature Granulocytes: 0 %
Lymphocytes Absolute: 2.2 10*3/uL (ref 0.7–3.1)
Lymphs: 29 %
MCH: 30.7 pg (ref 26.6–33.0)
MCHC: 34.1 g/dL (ref 31.5–35.7)
MCV: 90 fL (ref 79–97)
Monocytes Absolute: 0.4 10*3/uL (ref 0.1–0.9)
Monocytes: 5 %
Neutrophils Absolute: 4.6 10*3/uL (ref 1.4–7.0)
Neutrophils: 62 %
Platelets: 259 10*3/uL (ref 150–450)
RBC: 4.85 x10E6/uL (ref 3.77–5.28)
RDW: 12.7 % (ref 11.7–15.4)
WBC: 7.5 10*3/uL (ref 3.4–10.8)

## 2022-11-08 LAB — LIPID PANEL
Chol/HDL Ratio: 5 ratio — ABNORMAL HIGH (ref 0.0–4.4)
Cholesterol, Total: 136 mg/dL (ref 100–199)
HDL: 27 mg/dL — ABNORMAL LOW (ref >39)
LDL Chol Calc (NIH): 77 mg/dL (ref 0–99)
Triglycerides: 185 mg/dL — ABNORMAL HIGH (ref 0–149)
VLDL Cholesterol Cal: 32 mg/dL (ref 5–40)

## 2022-11-08 NOTE — Telephone Encounter (Signed)
   Telephone encounter was:  Successful.  11/08/2022 Name: [name] MRN: [id number] DOB: 10-Aug-1960  [name] is a 62 y.o. year old female who is a primary care patient of Linda Pierini, FNP . The community resource team was consulted for assistance with Food Insecurity and Financial Difficulties related to Financial Strain  Care guide performed the following interventions: Patient provided with information about care guide support team and interviewed to confirm resource needs.Patient is having financial and cant afford to buy food and pay for medications. I will be sending referrals to [facility], [facility] and mailing information for food and financial needs. I did give a few resources over the phone for food banks and pantries   Follow Up Plan:  No further follow up planned at this time. The patient has been provided with needed resources.    [name] [facility] Guide, [facility] [phone] [address] [place], [place], [place] [address] Phone: [phone] Email: [email or web address]

## 2022-11-11 ENCOUNTER — Telehealth: Payer: Self-pay

## 2022-11-11 DIAGNOSIS — I10 Essential (primary) hypertension: Secondary | ICD-10-CM

## 2022-11-12 LAB — TOXASSURE SELECT 13 (MW), URINE

## 2022-11-25 NOTE — Telephone Encounter (Signed)
Erroneous encounter will close.

## 2022-12-03 ENCOUNTER — Ambulatory Visit (INDEPENDENT_AMBULATORY_CARE_PROVIDER_SITE_OTHER): Payer: Medicare Other | Admitting: Nurse Practitioner

## 2022-12-03 ENCOUNTER — Encounter: Payer: Self-pay | Admitting: Nurse Practitioner

## 2022-12-03 VITALS — BP 127/77 | HR 85 | Temp 98.1°F | Ht 60.0 in | Wt 140.2 lb

## 2022-12-03 DIAGNOSIS — L988 Other specified disorders of the skin and subcutaneous tissue: Secondary | ICD-10-CM | POA: Diagnosis not present

## 2022-12-03 DIAGNOSIS — L989 Disorder of the skin and subcutaneous tissue, unspecified: Secondary | ICD-10-CM

## 2022-12-03 NOTE — Progress Notes (Signed)
   Subjective:    Patient ID: [name], female    DOB: 08/30/1960, 62 y.o.   MRN: [id number]   Chief Complaint: Nevus   HPI Patient actually has a keratotic lesion o n back along left side of bra. Wants removed.  Patient Active Problem List   Diagnosis Date Noted   Primary insomnia 05/09/2021   Spinal stenosis of cervical region 04/11/2020   Lumbar spondylosis 04/07/2020   Occipital neuralgia 02/23/2020   Vitamin D deficiency 12/12/2014   Migraines 10/15/2012   Essential hypertension, benign 10/15/2012   Hyperlipidemia with target LDL less than 100 10/15/2012       Review of Systems  Constitutional:  Negative for diaphoresis.  Eyes:  Negative for pain.  Respiratory:  Negative for shortness of breath.   Cardiovascular:  Negative for chest pain, palpitations and leg swelling.  Gastrointestinal:  Negative for abdominal pain.  Endocrine: Negative for polydipsia.  Skin:  Negative for rash.  Neurological:  Negative for dizziness, weakness and headaches.  Hematological:  Does not bruise/bleed easily.  All other systems reviewed and are negative.      Objective:   Physical Exam Vitals reviewed.  Constitutional:      Appearance: Normal appearance.  Cardiovascular:     Rate and Rhythm: Normal rate and regular rhythm.     Heart sounds: Normal heart sounds.  Pulmonary:     Effort: Pulmonary effort is normal.     Breath sounds: Normal breath sounds.  Skin:    General: Skin is warm.  Neurological:     General: No focal deficit present.     Mental Status: She is alert and oriented to person, place, and time.  Psychiatric:        Mood and Affect: Mood normal.        Behavior: Behavior normal.     BP 127/77   Pulse 85   Temp 98.1 F (36.7 C) (Temporal)   Ht 5' (1.524 m)   Wt 140 lb 4 oz (63.6 kg)   LMP 07/17/2012   SpO2 97%   BMI 27.39 kg/m    Skin excision  Date/Time: 12/03/2022 2:29 PM  Performed by: Bennie Pierini, FNP Authorized by:  Daphine Deutscher Mary-Margaret, FNP   Number of Lesions: 1 Lesion 1:    Body area: trunk   Trunk location: back   Initial size (mm): 3   Final defect size (mm): 4   Malignancy: benign lesion     Destruction method: scissors used for extraction     Repair comments: Removed with 15 blade and pickups Silver nitrate stick for cauterization       Assessment & Plan:   [name] in today with chief complaint of Nevus   1. Skin lesion of back Keep clean and dry Report any issues RTO prn    The above assessment and management plan was discussed with the patient. The patient verbalized understanding of and has agreed to the management plan. Patient is aware to call the clinic if symptoms persist or worsen. Patient is aware when to return to the clinic for a follow-up visit. Patient educated on when it is appropriate to go to the emergency department.   Mary-Margaret Daphine Deutscher, FNP

## 2022-12-04 ENCOUNTER — Ambulatory Visit: Payer: Medicare Other | Admitting: Nurse Practitioner

## 2022-12-12 ENCOUNTER — Ambulatory Visit (INDEPENDENT_AMBULATORY_CARE_PROVIDER_SITE_OTHER): Payer: Medicare Other | Admitting: Pharmacist

## 2022-12-12 DIAGNOSIS — Z79899 Other long term (current) drug therapy: Secondary | ICD-10-CM

## 2022-12-12 NOTE — Progress Notes (Signed)
   12/12/2022 Name: [name] MRN: [id number] DOB: 22-Sep-1960  Medication Assistance  [name] is a 62 y.o. year old female who presented for a telephone visit.   They were referred to the pharmacist by  [facility] care management  for assistance in managing medication access.   Subjective:  Care Team: Primary Care Provider: Bennie Pierini, FNP    Medication Access/Adherence  Current Pharmacy:  [facility] [address] [place] - [address] [place] [address] Phone: [phone] Fax: [phone]  [facility] [address] [place], [place] - [address] [place] [address] Phone: [phone] Fax: [phone]   Patient reports affordability concerns with their medications: Yes  Patient reports access/transportation concerns to their pharmacy: No  Patient reports adherence concerns with their medications:  No     Medication Management:  Patient reports Good adherence to medications  Patient reports the following barriers to adherence: cost  Objective:  Lab Results  Component Value Date   HGBA1C 6.2 (H) 11/07/2022    Lab Results  Component Value Date   CREATININE 0.66 11/07/2022   BUN 12 11/07/2022   NA 144 11/07/2022   K 4.0 11/07/2022   CL 107 (H) 11/07/2022   CO2 20 11/07/2022    Lab Results  Component Value Date   CHOL 136 11/07/2022   HDL 27 (L) 11/07/2022   LDLCALC 77 11/07/2022   TRIG 185 (H) 11/07/2022   CHOLHDL 5.0 (H) 11/07/2022    Medications Reviewed Today     Reviewed by Linda Pierini, FNP (Family Nurse Practitioner) on 12/03/22 at 1433  Med List Status: <None>   Medication Order Taking? Sig Documenting Provider Last Dose Status Informant  amLODipine (NORVASC) 5 MG tablet [id number] Yes Take 1 tablet (5 mg total) by mouth daily. Daphine Deutscher Mary-Margaret, FNP Taking Active   aspirin 81 MG chewable tablet [id number] Yes Chew 81 mg by mouth daily. [provider]  Taking Active   atorvastatin (LIPITOR) 40 MG tablet [id number] Yes Take 1 tablet (40 mg total) by mouth daily. Linda Pierini, FNP Taking Active   celecoxib (CELEBREX) 200 MG capsule [id number] Yes Take 1 capsule by mouth twice daily Linda Pierini, FNP Taking Active   cyclobenzaprine (FLEXERIL) 10 MG tablet [id number] Yes Take 1 tablet (10 mg total) by mouth 3 (three) times daily as needed for muscle spasms. Daphine Deutscher, Mary-Margaret, FNP Taking Active   fenofibrate (TRICOR) 145 MG tablet [id number] Yes Take 1 tablet (145 mg total) by mouth daily. Daphine Deutscher, Mary-Margaret, FNP Taking Active   ibuprofen (ADVIL) 200 MG tablet [id number] Yes Take 800 mg by mouth every 8 (eight) hours as needed. [provider] Taking Active   montelukast (SINGULAIR) 10 MG tablet [id number] Yes Take 1 tablet (10 mg total) by mouth at bedtime. Daphine Deutscher, Mary-Margaret, FNP Taking Active   zolpidem (AMBIEN) 10 MG tablet [id number] Yes Take 1 tablet (10 mg total) by mouth at bedtime as needed. for sleep Linda Pierini, FNP Taking Active               Assessment/Plan:    Assisted patient with good rx card for celecoxib (Celebrex) She does not appear to have RX coverage (just A/B medicare) Encouraged patient to reach out to disability case worker to inquire about coverage Good rx card emailed to patient as well All other medications are affordable       Kieth Brightly, PharmD, BCACP Clinical Pharmacist, [facility]

## 2022-12-17 ENCOUNTER — Telehealth: Payer: Medicare Other

## 2023-02-23 ENCOUNTER — Other Ambulatory Visit: Payer: Self-pay | Admitting: Nurse Practitioner

## 2023-02-23 DIAGNOSIS — M1990 Unspecified osteoarthritis, unspecified site: Secondary | ICD-10-CM

## 2023-03-27 ENCOUNTER — Other Ambulatory Visit: Payer: Self-pay | Admitting: Nurse Practitioner

## 2023-03-27 DIAGNOSIS — Z1231 Encounter for screening mammogram for malignant neoplasm of breast: Secondary | ICD-10-CM

## 2023-05-05 ENCOUNTER — Encounter: Payer: Self-pay | Admitting: Nurse Practitioner

## 2023-05-05 ENCOUNTER — Ambulatory Visit (INDEPENDENT_AMBULATORY_CARE_PROVIDER_SITE_OTHER): Payer: Medicare Other | Admitting: Nurse Practitioner

## 2023-05-05 VITALS — BP 150/82 | HR 65 | Temp 97.7°F | Resp 20 | Ht 60.0 in | Wt 134.0 lb

## 2023-05-05 DIAGNOSIS — R300 Dysuria: Secondary | ICD-10-CM | POA: Diagnosis not present

## 2023-05-05 DIAGNOSIS — Z23 Encounter for immunization: Secondary | ICD-10-CM

## 2023-05-05 LAB — MICROSCOPIC EXAMINATION
Renal Epithel, UA: NONE SEEN /[HPF]
Yeast, UA: NONE SEEN

## 2023-05-05 LAB — URINALYSIS, COMPLETE
Bilirubin, UA: NEGATIVE
Glucose, UA: NEGATIVE
Ketones, UA: NEGATIVE
Leukocytes,UA: NEGATIVE
Nitrite, UA: NEGATIVE
Protein,UA: NEGATIVE
Specific Gravity, UA: >1.03 — ABNORMAL HIGH (ref 1.005–1.030)
Urobilinogen, Ur: 0.2 mg/dL (ref 0.2–1.0)
pH, UA: 5.5 (ref 5.0–7.5)

## 2023-05-05 MED ORDER — PHENAZOPYRIDINE HCL 100 MG PO TABS
100.0000 mg | ORAL_TABLET | Freq: Three times a day (TID) | ORAL | 0 refills | Status: DC | PRN
Start: 2023-05-05 — End: 2023-05-27

## 2023-05-05 MED ORDER — SULFAMETHOXAZOLE-TRIMETHOPRIM 800-160 MG PO TABS
1.0000 | ORAL_TABLET | Freq: Two times a day (BID) | ORAL | 0 refills | Status: DC
Start: 2023-05-05 — End: 2023-05-27

## 2023-05-05 NOTE — Patient Instructions (Signed)
Take medication as prescribe Cotton underwear Take shower not bath Cranberry juice, yogurt Force fluids AZO over the counter X2 days Culture pending RTO prn  

## 2023-05-05 NOTE — Progress Notes (Signed)
   Subjective:    Patient ID: [name], female    DOB: 02-Mar-1961, 62 y.o.   MRN: [id number]   Chief Complaint: Back Pain and Hematuria   Dysuria  This is a new problem. The current episode started in the past 7 days. The problem occurs intermittently. The problem has been waxing and waning. The quality of the pain is described as burning. The pain is at a severity of 8/10. The pain is mild. There has been no fever. She is Not sexually active. There is No history of pyelonephritis. Associated symptoms include flank pain, hesitancy and urgency. She has tried nothing for the symptoms. The treatment provided mild relief.     Patient Active Problem List   Diagnosis Date Noted   Primary insomnia 05/09/2021   Spinal stenosis of cervical region 04/11/2020   Lumbar spondylosis 04/07/2020   Occipital neuralgia 02/23/2020   Vitamin D deficiency 12/12/2014   Migraines 10/15/2012   Essential hypertension, benign 10/15/2012   Hyperlipidemia with target LDL less than 100 10/15/2012       Review of Systems  Genitourinary:  Positive for dysuria, flank pain, hesitancy and urgency.       Objective:   Physical Exam Vitals reviewed.  Constitutional:      Appearance: Normal appearance.  Cardiovascular:     Rate and Rhythm: Normal rate and regular rhythm.     Heart sounds: Normal heart sounds.  Pulmonary:     Effort: Pulmonary effort is normal.     Breath sounds: Normal breath sounds.  Skin:    General: Skin is warm.  Neurological:     General: No focal deficit present.     Mental Status: She is alert and oriented to person, place, and time.  Psychiatric:        Mood and Affect: Mood normal.        Behavior: Behavior normal.     BP (!) 150/82   Pulse 65   Temp 97.7 F (36.5 C) (Temporal)   Resp 20   Ht 5' (1.524 m)   Wt 134 lb (60.8 kg)   LMP 07/17/2012   SpO2 97%   BMI 26.17 kg/m        Assessment & Plan:   [name] in today with chief complaint of  Back Pain and Hematuria   1. Dysuria Take medication as prescribe Cotton underwear Take shower not bath Cranberry juice, yogurt Force fluids AZO over the counter X2 days Culture pending RTO prn  - Urinalysis, Complete - Urine Culture - sulfamethoxazole-trimethoprim (BACTRIM DS) 800-160 MG tablet; Take 1 tablet by mouth 2 (two) times daily.  Dispense: 14 tablet; Refill: 0 - phenazopyridine (PYRIDIUM) 100 MG tablet; Take 1 tablet (100 mg total) by mouth 3 (three) times daily as needed for pain.  Dispense: 10 tablet; Refill: 0    The above assessment and management plan was discussed with the patient. The patient verbalized understanding of and has agreed to the management plan. Patient is aware to call the clinic if symptoms persist or worsen. Patient is aware when to return to the clinic for a follow-up visit. Patient educated on when it is appropriate to go to the emergency department.   Mary-Margaret Daphine Deutscher, FNP

## 2023-05-06 ENCOUNTER — Ambulatory Visit: Payer: Medicare Other | Admitting: Nurse Practitioner

## 2023-05-07 LAB — URINE CULTURE

## 2023-05-13 ENCOUNTER — Ambulatory Visit: Payer: Medicare Other | Admitting: Nurse Practitioner

## 2023-05-21 ENCOUNTER — Ambulatory Visit: Payer: Self-pay | Admitting: Nurse Practitioner

## 2023-05-21 NOTE — Telephone Encounter (Signed)
Copied from CRM [id number]. Topic: Clinical - Red Word Triage >> May 21, 2023  3:08 PM Deaijah H wrote: Red Word that prompted transfer to Nurse Triage: Chest pain  Chief Complaint: chest pain left chest Symptoms: pain that last for 5 minutes  Frequency: ongoing x 3 weeks unrelieved by medication Pertinent Negatives: Patient denies SOB Disposition: [x] ED /[] Urgent Care (no appt availability in office) / [] Appointment(In office/virtual)/ []  [place] [facility]/ [] Home Care/ [] Refused Recommended Disposition /[] [place] [facility]/ []  Follow-up with PCP Additional Notes: pt c/o chest pain unrelieved by medication 8/10: ongoing x 3 weeks: nurse advised go to ER now or call 911: pt verbalized understanding and stated going to ED: advised to have someone to go with her for safety reasons Reason for Disposition  [1] Chest pain (or "angina") comes and goes AND [2] is happening more often (increasing in frequency) or getting worse (increasing in severity)  (Exception: Chest pains that last only a few seconds.)  Answer Assessment - Initial Assessment Questions 1. LOCATION: "Where does it hurt?"       Pain in chest: left chest  2. RADIATION: "Does the pain go anywhere else?" (e.g., into neck, jaw, arms, back)     Radiating into neck 3. ONSET: "When did the chest pain begin?" (Minutes, hours or days)      X 3 weeks 4. PATTERN: "Does the pain come and go, or has it been constant since it started?"  "Does it get worse with exertion?"      Constant gets worse 5. DURATION: "How long does it last" (e.g., seconds, minutes, hours)     Lasts 5 minutes then stops and comes back 6. SEVERITY: "How bad is the pain?"  (e.g., Scale 1-10; mild, moderate, or severe)    - MILD (1-3): doesn't interfere with normal activities     - MODERATE (4-7): interferes with normal activities or awakens from sleep    - SEVERE (8-10): excruciating pain, unable to do any normal activities       8/10 7. CARDIAC RISK FACTORS:  "Do you have any history of heart problems or risk factors for heart disease?" (e.g., angina, prior heart attack; diabetes, high blood pressure, high cholesterol, smoker, or strong family history of heart disease)     History of heart issues 8. PULMONARY RISK FACTORS: "Do you have any history of lung disease?"  (e.g., blood clots in lung, asthma, emphysema, birth control pills)     N/a 9. CAUSE: "What do you think is causing the chest pain?"     unknown 10. OTHER SYMPTOMS: "Do you have any other symptoms?" (e.g., dizziness, nausea, vomiting, sweating, fever, difficulty breathing, cough)       No SOB 11. PREGNANCY: "Is there any chance you are pregnant?" "When was your last menstrual period?"       no  Protocols used: Chest Pain-A-AH

## 2023-05-21 NOTE — Telephone Encounter (Signed)
FYI, patient advised to go to ED

## 2023-05-27 ENCOUNTER — Ambulatory Visit (INDEPENDENT_AMBULATORY_CARE_PROVIDER_SITE_OTHER): Payer: Medicare Other | Admitting: Nurse Practitioner

## 2023-05-27 ENCOUNTER — Ambulatory Visit (INDEPENDENT_AMBULATORY_CARE_PROVIDER_SITE_OTHER): Payer: Medicare Other

## 2023-05-27 ENCOUNTER — Encounter: Payer: Self-pay | Admitting: Nurse Practitioner

## 2023-05-27 VITALS — BP 132/84 | HR 83 | Temp 97.6°F | Resp 20 | Ht 60.0 in | Wt 134.4 lb

## 2023-05-27 DIAGNOSIS — I10 Essential (primary) hypertension: Secondary | ICD-10-CM | POA: Diagnosis not present

## 2023-05-27 DIAGNOSIS — R109 Unspecified abdominal pain: Secondary | ICD-10-CM

## 2023-05-27 DIAGNOSIS — E785 Hyperlipidemia, unspecified: Secondary | ICD-10-CM

## 2023-05-27 DIAGNOSIS — K5901 Slow transit constipation: Secondary | ICD-10-CM

## 2023-05-27 DIAGNOSIS — R079 Chest pain, unspecified: Secondary | ICD-10-CM

## 2023-05-27 DIAGNOSIS — E559 Vitamin D deficiency, unspecified: Secondary | ICD-10-CM | POA: Diagnosis not present

## 2023-05-27 DIAGNOSIS — F5101 Primary insomnia: Secondary | ICD-10-CM

## 2023-05-27 DIAGNOSIS — M4802 Spinal stenosis, cervical region: Secondary | ICD-10-CM

## 2023-05-27 LAB — URINALYSIS, COMPLETE
Bilirubin, UA: NEGATIVE
Glucose, UA: NEGATIVE
Ketones, UA: NEGATIVE
Leukocytes,UA: NEGATIVE
Nitrite, UA: NEGATIVE
Protein,UA: NEGATIVE
Specific Gravity, UA: 1.01 (ref 1.005–1.030)
Urobilinogen, Ur: 0.2 mg/dL (ref 0.2–1.0)
pH, UA: 6 (ref 5.0–7.5)

## 2023-05-27 LAB — MICROSCOPIC EXAMINATION
Bacteria, UA: NONE SEEN
RBC, Urine: NONE SEEN /[HPF] (ref 0–2)
Renal Epithel, UA: NONE SEEN /[HPF]
Yeast, UA: NONE SEEN

## 2023-05-27 MED ORDER — ZOLPIDEM TARTRATE 10 MG PO TABS: 10.0000 mg | ORAL_TABLET | Freq: Every evening | ORAL | 1 refills | Status: DC | PRN

## 2023-05-27 MED ORDER — AMLODIPINE BESYLATE 5 MG PO TABS: 5.0000 mg | ORAL_TABLET | Freq: Every day | ORAL | 1 refills | Status: DC

## 2023-05-27 MED ORDER — FENOFIBRATE 145 MG PO TABS
145.0000 mg | ORAL_TABLET | Freq: Every day | ORAL | 1 refills | Status: DC
Start: 2023-05-27 — End: 2023-11-18

## 2023-05-27 MED ORDER — ATORVASTATIN CALCIUM 40 MG PO TABS: 40.0000 mg | ORAL_TABLET | Freq: Every day | ORAL | 1 refills | Status: DC

## 2023-05-27 NOTE — Progress Notes (Signed)
Subjective:    Patient ID: [name], female    DOB: 1961/03/22, 62 y.o.   MRN: [id number]   Chief Complaint: medical management of chronic issues     HPI:  ZENA Carr is a 62 y.o. who identifies as a female who was assigned female at birth.   Social history: Lives with: her son lives with her Work history: retired   Water engineer in today for follow up of the following chronic medical issues:  1. Essential hypertension, benign No c/o chest pain, sob or headache. Does not check blood pressure at home. BP Readings from Last 3 Encounters:  05/05/23 (!) 150/82  12/03/22 127/77  11/07/22 (!) 146/92     2. Hyperlipidemia with target LDL less than 100 Does try to watch diet does no exercise. Lab Results  Component Value Date   CHOL 136 11/07/2022   HDL 27 (L) 11/07/2022   LDLCALC 77 11/07/2022   TRIG 185 (H) 11/07/2022   CHOLHDL 5.0 (H) 11/07/2022     3. Primary insomnia Is on ambien and is not able to sleep well without meds  4. Vitamin D deficiency Is on daily viatmin d supplement  5. Spinal stenosis of cervical region Has chronic back pain. Is on flexeril and celebrex which usually help.   New complaints: -Right flank pain- started over a week ago has history  of back issues anyway. Is on flexeril and celebrex. Rates pain 2/10 - has had intermitttent chest pain for 3 weeks. No sob or sweating. Describes pains as sharp pains. Denies feeling heavy or squeezing.  Allergies  Allergen Reactions   Baclofen Nausea And Vomiting   Outpatient Encounter Medications as of 05/27/2023  Medication Sig   amLODipine (NORVASC) 5 MG tablet Take 1 tablet (5 mg total) by mouth daily.   aspirin 81 MG chewable tablet Chew 81 mg by mouth daily.   atorvastatin (LIPITOR) 40 MG tablet Take 1 tablet (40 mg total) by mouth daily.   celecoxib (CELEBREX) 200 MG capsule Take 1 capsule by mouth twice daily   cyclobenzaprine (FLEXERIL) 10 MG tablet Take 1 tablet (10 mg total) by  mouth 3 (three) times daily as needed for muscle spasms.   fenofibrate (TRICOR) 145 MG tablet Take 1 tablet (145 mg total) by mouth daily.   ibuprofen (ADVIL) 200 MG tablet Take 800 mg by mouth every 8 (eight) hours as needed.   montelukast (SINGULAIR) 10 MG tablet Take 1 tablet (10 mg total) by mouth at bedtime.   phenazopyridine (PYRIDIUM) 100 MG tablet Take 1 tablet (100 mg total) by mouth 3 (three) times daily as needed for pain.   sulfamethoxazole-trimethoprim (BACTRIM DS) 800-160 MG tablet Take 1 tablet by mouth 2 (two) times daily.   zolpidem (AMBIEN) 10 MG tablet Take 1 tablet (10 mg total) by mouth at bedtime as needed. for sleep   No facility-administered encounter medications on file as of 05/27/2023.    Past Surgical History:  Procedure Laterality Date   CHOLECYSTECTOMY     TUBAL LIGATION      Family History  Problem Relation Age of Onset   Diabetes Mother    Heart disease Father    Diabetes Father    Healthy Sister    Healthy Sister    Healthy Brother    Breast cancer Other       Controlled substance contract: n/a     Review of Systems  Constitutional:  Negative for diaphoresis.  Eyes:  Negative for pain.  Respiratory:  Negative for shortness of breath.   Cardiovascular:  Negative for chest pain, palpitations and leg swelling.  Gastrointestinal:  Negative for abdominal pain.  Endocrine: Negative for polydipsia.  Skin:  Negative for rash.  Neurological:  Negative for dizziness, weakness and headaches.  Hematological:  Does not bruise/bleed easily.  All other systems reviewed and are negative.      Objective:   Physical Exam Vitals and nursing note reviewed.  Constitutional:      General: She is not in acute distress.    Appearance: Normal appearance. She is well-developed.  HENT:     Head: Normocephalic.     Right Ear: Tympanic membrane normal.     Left Ear: Tympanic membrane normal.     Nose: Nose normal.     Mouth/Throat:     Mouth: Mucous  membranes are moist.  Eyes:     Pupils: Pupils are equal, round, and reactive to light.  Neck:     Vascular: No carotid bruit or JVD.  Cardiovascular:     Rate and Rhythm: Normal rate and regular rhythm.     Heart sounds: Normal heart sounds.  Pulmonary:     Effort: Pulmonary effort is normal. No respiratory distress.     Breath sounds: Normal breath sounds. No wheezing or rales.  Chest:     Chest wall: No tenderness.  Abdominal:     General: Bowel sounds are normal. There is no distension or abdominal bruit.     Palpations: Abdomen is soft. There is no hepatomegaly, splenomegaly, mass or pulsatile mass.     Tenderness: There is no abdominal tenderness.  Musculoskeletal:        General: Normal range of motion.     Cervical back: Normal range of motion and neck supple.  Lymphadenopathy:     Cervical: No cervical adenopathy.  Skin:    General: Skin is warm and dry.  Neurological:     Mental Status: She is alert and oriented to person, place, and time.     Deep Tendon Reflexes: Reflexes are normal and symmetric.  Psychiatric:        Behavior: Behavior normal.        Thought Content: Thought content normal.        Judgment: Judgment normal.     BP (!) 153/87   Pulse 83   Temp 97.6 F (36.4 C) (Oral)   Resp 20   Ht 5' (1.524 m)   Wt 134 lb 6 oz (61 kg)   LMP 07/17/2012   SpO2 100%   BMI 26.24 kg/m   KUB-moderate constipation- Preliminary reading by Paulene Floor, FNP  [facility] Urine clear  EKG- nsr-Mary-Margaret Daphine Deutscher, FNP     Assessment & Plan:   [name] comes in today with chief complaint of Medical Management of Chronic Issues, Urinary Tract Infection, and chest pain   Diagnosis and orders addressed:  1. Essential hypertension, benign Low sodium diet - EKG 12-Lead - amLODipine (NORVASC) 5 MG tablet; Take 1 tablet (5 mg total) by mouth daily.  Dispense: 90 tablet; Refill: 1 - CBC with Differential/Platelet - CMP14+EGFR  2. Hyperlipidemia with target  LDL less than 100 Low fat diet - atorvastatin (LIPITOR) 40 MG tablet; Take 1 tablet (40 mg total) by mouth daily.  Dispense: 90 tablet; Refill: 1 - fenofibrate (TRICOR) 145 MG tablet; Take 1 tablet (145 mg total) by mouth daily.  Dispense: 90 tablet; Refill: 1 - Lipid panel  3. Primary insomnia Bedtime routine - zolpidem (AMBIEN)  10 MG tablet; Take 1 tablet (10 mg total) by mouth at bedtime as needed. for sleep  Dispense: 90 tablet; Refill: 1  4. Vitamin D deficiency Daily vitamin d supplement  5. Spinal stenosis of cervical region Flexeril as needed  6. Chest pain, unspecified type normal - EKG 12-Lead  7. Flank pain - Urinalysis, Complete - Urine Culture - DG Abd 1 View  8. Slow transit constipation Miralax daily in apple juice   Labs pending Health Maintenance reviewed Diet and exercise encouraged  Follow up plan: 6 months   Mary-Margaret Daphine Deutscher, FNP

## 2023-05-28 ENCOUNTER — Ambulatory Visit
Admission: RE | Admit: 2023-05-28 | Discharge: 2023-05-28 | Disposition: A | Discharge status: Home / Self Care | Payer: Medicare Other | Source: Ambulatory Visit | Attending: Nurse Practitioner | Admitting: Nurse Practitioner

## 2023-05-28 DIAGNOSIS — Z1231 Encounter for screening mammogram for malignant neoplasm of breast: Secondary | ICD-10-CM

## 2023-05-29 LAB — URINE CULTURE: Organism ID, Bacteria: NO GROWTH

## 2023-08-05 ENCOUNTER — Ambulatory Visit (INDEPENDENT_AMBULATORY_CARE_PROVIDER_SITE_OTHER): Payer: Medicare Other | Admitting: Nurse Practitioner

## 2023-08-05 ENCOUNTER — Encounter: Payer: Self-pay | Admitting: Nurse Practitioner

## 2023-08-05 VITALS — BP 140/78 | HR 84 | Temp 98.0°F | Ht 60.0 in | Wt 135.0 lb

## 2023-08-05 DIAGNOSIS — M545 Low back pain, unspecified: Secondary | ICD-10-CM

## 2023-08-05 DIAGNOSIS — D171 Benign lipomatous neoplasm of skin and subcutaneous tissue of trunk: Secondary | ICD-10-CM | POA: Diagnosis not present

## 2023-08-05 MED ORDER — CYCLOBENZAPRINE HCL 10 MG PO TABS: 10.0000 mg | ORAL_TABLET | Freq: Three times a day (TID) | ORAL | 0 refills | Status: DC | PRN

## 2023-08-05 NOTE — Progress Notes (Signed)
Acute Office Visit  Subjective:     Patient ID: [name], female    DOB: Oct 01, 1960, 63 y.o.   MRN: [id number]  Chief Complaint  Patient presents with   Abdominal Pain    4 weeks mis stepped and went to urgent care and was told she sprained a muscle but she feels like something is torn. Has bulge on right side now     HPI [name] is a 63 yrs old caucasian female presents 08/05/2023 for an acute visit concerns for non painful lump on right, and requesting Cyclobenzaprine refill. History of Present Illness: The patient is a 63 year old female presenting with a new concern regarding a possible lipoma. She reports that she recently noticed a lump on her flank, which she believes might be a lipoma. The lump has been present for about three weeks, following a fall that she experienced while in [place]. She has no significant pain associated with the lump, but has noticed mild tenderness when pressure is applied to the area. The lump is mobile and does not seem to have changed in size since it was first noticed.  The patient has a history of previous lipomas but no significant history of trauma or other skin-related concerns prior to this incident. There is no associated redness, warmth, or systemic symptoms such as fever or weight loss. She is otherwise healthy and does not take any regular medications.   Active Ambulatory Problems    Diagnosis Date Noted   Migraines 10/15/2012   Essential hypertension, benign 10/15/2012   Hyperlipidemia with target LDL less than 100 10/15/2012   Vitamin D deficiency 12/12/2014   Spinal stenosis of cervical region 04/11/2020   Lumbar spondylosis 04/07/2020   Occipital neuralgia 02/23/2020   Primary insomnia 05/09/2021   Lipoma of flank 08/05/2023   Resolved Ambulatory Problems    Diagnosis Date Noted   No Resolved Ambulatory Problems   Past Medical History:  Diagnosis Date   HTN (hypertension)    Hyperlipidemia       ROS General: No fever, chills, or weight loss. Skin: New lump noted on the flank, mobile, non-tender unless palpated. Musculoskeletal: No joint pain, swelling, or weakness; denies other areas of bruising or injury. No neurological, cardiovascular, or respiratory symptoms.Negative unless indicated in HPI    Objective:    BP (!) 140/78   Pulse 84   Temp 98 F (36.7 C) (Temporal)   Ht 5' (1.524 m)   Wt 135 lb (61.2 kg)   LMP 07/17/2012   SpO2 99%   BMI 26.37 kg/m  BP Readings from Last 3 Encounters:  08/05/23 (!) 140/78  05/27/23 132/84  05/05/23 (!) 150/82   Wt Readings from Last 3 Encounters:  08/05/23 135 lb (61.2 kg)  05/27/23 134 lb 6 oz (61 kg)  05/05/23 134 lb (60.8 kg)      Physical Exam Vitals and nursing note reviewed.  Constitutional:      General: She is not in acute distress.    Appearance: She is overweight.     Comments: Central obesity  HENT:     Head: Normocephalic and atraumatic.     Nose: Nose normal.     Mouth/Throat:     Mouth: Mucous membranes are moist.  Eyes:     General: No scleral icterus.    Extraocular Movements: Extraocular movements intact.     Conjunctiva/sclera: Conjunctivae normal.     Pupils: Pupils are equal, round, and reactive to light.  Cardiovascular:  Rate and Rhythm: Normal rate and regular rhythm.  Pulmonary:     Effort: Pulmonary effort is normal.     Breath sounds: Normal breath sounds.  Abdominal:     General: Bowel sounds are normal.     Palpations: Abdomen is soft.       Comments: Lipoma right flank area  Musculoskeletal:        General: Normal range of motion.     Right lower leg: No edema.     Left lower leg: No edema.  Skin:    General: Skin is warm and dry.          Comments: Lipoma right flank area  Neurological:     Mental Status: She is alert and oriented to person, place, and time.     No results found for any visits on 08/05/23.      Assessment & Plan:  Lipoma of flank -      Cyclobenzaprine HCl; Take 1 tablet (10 mg total) by mouth 3 (three) times daily as needed for muscle spasms.  Dispense: 30 tablet; Refill: 0  Acute midline low back pain without sciatica -     Cyclobenzaprine HCl; Take 1 tablet (10 mg total) by mouth 3 (three) times daily as needed for muscle spasms.  Dispense: 30 tablet; Refill: 0  [name] 63 year old Caucasian female seen today for right flank lipoma, no acute distress Plan to just monitor, if becomes tender which call the clinic Refill of cyclobenzaprine provided for sciatica pain All questions answered Encourage healthy lifestyle choices, including diet (rich in fruits, vegetables, and lean proteins, and low in salt and simple carbohydrates) and exercise (at least 30 minutes of moderate physical activity daily).     The above assessment and management plan was discussed with the patient. The patient verbalized understanding of and has agreed to the management plan. Patient is aware to call the clinic if they develop any new symptoms or if symptoms persist or worsen. Patient is aware when to return to the clinic for a follow-up visit. Patient educated on when it is appropriate to go to the emergency department.   Return if symptoms worsen or fail to improve.  Arrie Aran Santa Lighter, [facility] [address] [place], [place] [address] [phone]  Note: This document was prepared by [employer] voice dictation technology and any errors that results from this process are unintentional.

## 2023-08-11 ENCOUNTER — Ambulatory Visit (INDEPENDENT_AMBULATORY_CARE_PROVIDER_SITE_OTHER): Payer: Medicare Other | Admitting: Nurse Practitioner

## 2023-08-11 ENCOUNTER — Encounter: Payer: Self-pay | Admitting: Nurse Practitioner

## 2023-08-11 VITALS — BP 153/82 | HR 86 | Temp 97.6°F | Ht 60.0 in | Wt 135.0 lb

## 2023-08-11 DIAGNOSIS — R109 Unspecified abdominal pain: Secondary | ICD-10-CM | POA: Diagnosis not present

## 2023-08-11 NOTE — Progress Notes (Signed)
   Subjective:    Patient ID: [name], female    DOB: [birth year], 63 y.o.   MRN: [id number]   Chief Complaint: Buldge on right side of abdomen   Patient went to [place] to see her daughter. She said she stepped off of a step that she id not know was there and was jarred. She developed a funny sensation in right abdomen several days after. Now she has a knot on her right side. Has pain at times.     Patient Active Problem List   Diagnosis Date Noted   Lipoma of flank 08/05/2023   Primary insomnia 05/09/2021   Spinal stenosis of cervical region 04/11/2020   Lumbar spondylosis 04/07/2020   Occipital neuralgia 02/23/2020   Vitamin D deficiency 12/12/2014   Migraines 10/15/2012   Essential hypertension, benign 10/15/2012   Hyperlipidemia with target LDL less than 100 10/15/2012        Review of Systems  Constitutional:  Negative for diaphoresis.  Eyes:  Negative for pain.  Respiratory:  Negative for shortness of breath.   Cardiovascular:  Negative for chest pain, palpitations and leg swelling.  Gastrointestinal:  Negative for abdominal pain.  Endocrine: Negative for polydipsia.  Skin:  Negative for rash.  Neurological:  Negative for dizziness, weakness and headaches.  Hematological:  Does not bruise/bleed easily.  All other systems reviewed and are negative.      Objective:   Physical Exam Constitutional:      Appearance: Normal appearance.  Cardiovascular:     Rate and Rhythm: Normal rate and regular rhythm.  Pulmonary:     Effort: Pulmonary effort is normal.     Breath sounds: Normal breath sounds.  Skin:    General: Skin is warm.  Neurological:     General: No focal deficit present.     Mental Status: She is alert and oriented to person, place, and time.  Psychiatric:        Mood and Affect: Mood normal.        Behavior: Behavior normal.    BP (!) 153/82   Pulse 87   Temp 97.6 F (36.4 C) (Temporal)   LMP 07/17/2012   SpO2 99%          Assessment & Plan:   [name] in today with chief complaint of Buldge on right side of abdomen   1. Right flank pain (Primary) Watch area No strenuous activity - CT ABDOMEN PELVIS W CONTRAST    The above assessment and management plan was discussed with the patient. The patient verbalized understanding of and has agreed to the management plan. Patient is aware to call the clinic if symptoms persist or worsen. Patient is aware when to return to the clinic for a follow-up visit. Patient educated on when it is appropriate to go to the emergency department.   Mary-Margaret Daphine Deutscher, FNP

## 2023-09-02 ENCOUNTER — Ambulatory Visit (HOSPITAL_COMMUNITY)
Admission: RE | Admit: 2023-09-02 | Discharge: 2023-09-02 | Disposition: A | Discharge status: Home / Self Care | Payer: Medicare Other | Source: Ambulatory Visit | Attending: Nurse Practitioner | Admitting: Nurse Practitioner

## 2023-09-02 ENCOUNTER — Encounter (HOSPITAL_COMMUNITY): Payer: Self-pay | Admitting: Radiology

## 2023-09-02 DIAGNOSIS — R109 Unspecified abdominal pain: Secondary | ICD-10-CM | POA: Insufficient documentation

## 2023-09-09 ENCOUNTER — Other Ambulatory Visit: Payer: Self-pay | Admitting: Nurse Practitioner

## 2023-09-09 DIAGNOSIS — G8929 Other chronic pain: Secondary | ICD-10-CM

## 2023-09-10 ENCOUNTER — Other Ambulatory Visit: Payer: Self-pay | Admitting: Nurse Practitioner

## 2023-09-10 DIAGNOSIS — M1990 Unspecified osteoarthritis, unspecified site: Secondary | ICD-10-CM

## 2023-09-16 ENCOUNTER — Encounter: Payer: Self-pay | Admitting: Physician Assistant

## 2023-10-06 ENCOUNTER — Other Ambulatory Visit: Payer: Self-pay | Admitting: Nurse Practitioner

## 2023-10-21 ENCOUNTER — Ambulatory Visit: Payer: Self-pay

## 2023-10-21 NOTE — Telephone Encounter (Signed)
  Chief Complaint: NA-she reported in error Symptoms: NA Frequency: NA Pertinent Negatives: Patient denies all symptoms Disposition: [] ED /[] Urgent Care (no appt availability in office) / [] Appointment(In office/virtual)/ []  New  Virtual Care/ [] Home Care/ [] Refused Recommended Disposition /[] [place] [facility]/ []  Follow-up with PCP Additional Notes:  Reached out to patient, she starts out the conversation laughing and said "Cancelled request". Since calling in for blood in urine she found out that her sister had added cleaner to toilet bowl that is pink. Patient denies all uti symptoms and states she only called because her typical UTI symptom is blood in urine. Patient confirms she does not have blood in urine, no urinary symptoms, healthy, and no further needs at this time.    Copied from CRM [id number]. Topic: Clinical - Medical Advice >> Oct 21, 2023 11:36 AM Jyl Or S wrote: Reason for CRM: Patient is asking if provider can call in a prescription for her UTI patient stated she now has blood in her urine please follow up patient requesting call back Reason for Disposition . [1] Follow-up call to recent contact AND [2] information only call, no triage required  Protocols used: Information Only Call - No Triage-A-AH

## 2023-11-02 NOTE — Progress Notes (Unsigned)
 Brigitte Canard, PA-C [address] [place], [place]  [address] Phone: [phone]   Gastroenterology Consultation  Referring Provider:     Delfina Feller, * Primary Care Physician:  Delfina Feller, FNP Primary Gastroenterologist:  Brigitte Canard, PA-C / Darol Elizabeth, MD  Reason for Consultation:     Chronic RUQ pain        HPI:   [name] is a 63 y.o. y/o female referred for consultation & management  by Delfina Feller, FNP.    Here to evaluate chronic RUQ pain.  Remote history of cholecystectomy in the early 2000's in [place], [place] at [facility].  Patient states she had worsening right side and right upper quadrant pain since December 2024.  She has seen her PCP 3 times.  Has been treated for constipation with MiraLAX and Metamucil.  She denies nausea or vomiting.  She admits to early satiety.  Gets full very easily.  Has episodes of bandlike pain across her upper abdomen.  Was having a bowel movement 3 times per week.  She takes MiraLAX sporadically as needed.  Takes Metamucil 4 tablets twice daily.  Denies rectal bleeding or weight loss.  09/07/2023 CT abdomen pelvis without contrast: To evaluate right flank pain, bulge in right side: Diffuse hepatic steatosis.  Prior cholecystectomy.  No acute abnormality.  Diverticulosis but no diverticulitis.  Incidental atherosclerosis.  No recent abdominal ultrasound.  11/2022 last labs: Normal CBC (Hgb 14.9g); Elevated Glucose 151, AST 47. Otherwise normal CMP.  No previous EGD, colonoscopy, or GI evaluation.  She reports having a negative Cologuard 3 years ago.  No family history of colon cancer.    She stopped drinking alcohol about 15 years ago.  Her son has fatty liver disease.  Past Medical History:  Diagnosis Date   HTN (hypertension)    Hyperlipidemia    Migraines     Past Surgical History:  Procedure Laterality Date   CHOLECYSTECTOMY     FOOT SURGERY Right 2017   ligament   SHOULDER  ARTHROSCOPY WITH ROTATOR CUFF REPAIR Right 2021   TUBAL LIGATION      Prior to Admission medications   Medication Sig Start Date End Date Taking? Authorizing Provider  amLODipine  (NORVASC ) 5 MG tablet Take 1 tablet (5 mg total) by mouth daily. 05/27/23   Gaylyn Keas Mary-Margaret, FNP  aspirin 81 MG chewable tablet Chew 81 mg by mouth daily.    [provider]  atorvastatin  (LIPITOR) 40 MG tablet Take 1 tablet (40 mg total) by mouth daily. 05/27/23   Delfina Feller, FNP  celecoxib  (CELEBREX ) 200 MG capsule Take 1 capsule by mouth twice daily 09/10/23   Delfina Feller, FNP  cyclobenzaprine  (FLEXERIL ) 10 MG tablet Take 1 tablet (10 mg total) by mouth 3 (three) times daily as needed for muscle spasms. 08/05/23   St Annice Kim, NP  fenofibrate  (TRICOR ) 145 MG tablet Take 1 tablet (145 mg total) by mouth daily. 05/27/23 05/26/24  Gaylyn Keas, Mary-Margaret, FNP  ibuprofen (ADVIL) 200 MG tablet Take 800 mg by mouth every 8 (eight) hours as needed.    [provider]  montelukast  (SINGULAIR ) 10 MG tablet TAKE 1 TABLET BY MOUTH AT BEDTIME 10/06/23   Gaylyn Keas, Mary-Margaret, FNP  zolpidem  (AMBIEN ) 10 MG tablet Take 1 tablet (10 mg total) by mouth at bedtime as needed. for sleep 05/27/23   Delfina Feller, FNP    Family History  Problem Relation Age of Onset   Diabetes Mother    Heart  disease Father    Diabetes Father    Healthy Sister    Healthy Sister    Healthy Brother    Breast cancer Other    Colon cancer Neg Hx    Esophageal cancer Neg Hx    Stomach cancer Neg Hx      Social History   Tobacco Use   Smoking status: Every Day    Current packs/day: 1.00    Average packs/day: 1 pack/day for 44.5 years (44.5 ttl pk-yrs)    Types: Cigarettes    Start date: 10/02/1983   Smokeless tobacco: Never   Tobacco comments:    Has been smoking 0.5-1 ppd since age 74  Vaping Use   Vaping status: Never Used  Substance Use Topics   Alcohol use: No   Drug use:  No    Allergies as of 11/03/2023 - Review Complete 11/03/2023  Allergen Reaction Noted   Baclofen  Nausea And Vomiting 04/18/2017    Review of Systems:    All systems reviewed and negative except where noted in HPI.   Physical Exam:  BP 114/72   Pulse 93   Ht 5' (1.524 m)   Wt 134 lb (60.8 kg)   LMP 07/17/2012   SpO2 (!) 9%   BMI 26.17 kg/m  Patient's last menstrual period was 07/17/2012.  General:   Alert,  Well-developed, well-nourished, pleasant and cooperative in NAD Lungs:  Respirations even and unlabored.  Clear throughout to auscultation.   No wheezes, crackles, or rhonchi. No acute distress. Heart:  Regular rate and rhythm; no murmurs, clicks, rubs, or gallops. Abdomen:  Normal bowel sounds.  No bruits.  Soft, and mildly obese without masses, hepatosplenomegaly or hernias noted.  Moderate RUQ Tenderness, Epigastric Tenderness, Right lateral side wall tenderness, RLQ and LLQ Tenderness.  NO LUQ tenderness.  No guarding or rebound tenderness.    Neurologic:  Alert and oriented x3;  grossly normal neurologically. Psych:  Alert and cooperative. Normal mood and affect.  Imaging Studies: No results found.  Labs: CBC    Component Value Date/Time   WBC 7.5 11/07/2022 1401   WBC 7.3 10/08/2010 0512   RBC 4.85 11/07/2022 1401   RBC 4.50 10/08/2010 0512   HGB 14.9 11/07/2022 1401   HCT 43.7 11/07/2022 1401   PLT 259 11/07/2022 1401   MCV 90 11/07/2022 1401   MCH 30.7 11/07/2022 1401   MCH 30.0 10/08/2010 0512   MCHC 34.1 11/07/2022 1401   MCHC 33.8 10/08/2010 0512   RDW 12.7 11/07/2022 1401   LYMPHSABS 2.2 11/07/2022 1401   MONOABS 0.4 10/08/2010 0512   EOSABS 0.2 11/07/2022 1401   BASOSABS 0.1 11/07/2022 1401    CMP     Component Value Date/Time   NA 144 11/07/2022 1401   K 4.0 11/07/2022 1401   CL 107 (H) 11/07/2022 1401   CO2 20 11/07/2022 1401   GLUCOSE 151 (H) 11/07/2022 1401   GLUCOSE 94 10/08/2010 0512   BUN 12 11/07/2022 1401   CREATININE 0.66  11/07/2022 1401   CALCIUM  9.7 11/07/2022 1401   PROT 6.6 11/07/2022 1401   ALBUMIN 4.3 11/07/2022 1401   AST 47 (H) 11/07/2022 1401   ALT 32 11/07/2022 1401   ALKPHOS 91 11/07/2022 1401   BILITOT 0.3 11/07/2022 1401   GFRNONAA 87 04/11/2020 1007   GFRAA 100 04/11/2020 1007    Assessment and Plan:   [name] is a 63 y.o. y/o female has been referred for:   Chronic right side pain; prior  cholecystectomy early 2000's -Lab CBC, CMP  Hepatic steatosis; elevated AST - Avoid alcohol (Last drink was 15 years ago). - RUQ ultrasound with liver elastography - evaluate for fibrosis. -Recommend a low-fat diet, regular exercise, and weight loss. Patient education handout about fatty liver disease was given and discussed from up-to-date.  -I discussed Rezdiffra medication to treat fatty liver disease with patient.  It is Only approved in patients who have F2 or F3 liver fibrosis.   3.  Epigastric Pain / Early Satiety -Scheduling EGD I discussed risks of EGD with patient to include risk of bleeding, perforation, and risk of sedation.  Patient expressed understanding and agrees to proceed with EGD.   4.  Chronic Constipation -Take OTC Miralax powder 17g in a drink daily. -Metamucil 2 tablets twice daily. -Drink 64 ounces of water daily. -High Fiber diet.  5.  Colon cancer screening (Negative Cologuard 3 years ago.  NO previous Colonoscopy) -Scheduling Colonoscopy I discussed risks of colonoscopy with patient to include risk of bleeding, colon perforation, and risk of sedation.  Patient expressed understanding and agrees to proceed with colonoscopy.   Follow up With TG 4-6 weeks after EGD / Colon.  Brigitte Canard, PA-C

## 2023-11-03 ENCOUNTER — Encounter: Payer: Self-pay | Admitting: Physician Assistant

## 2023-11-03 ENCOUNTER — Ambulatory Visit (INDEPENDENT_AMBULATORY_CARE_PROVIDER_SITE_OTHER): Admitting: Physician Assistant

## 2023-11-03 ENCOUNTER — Other Ambulatory Visit (INDEPENDENT_AMBULATORY_CARE_PROVIDER_SITE_OTHER)

## 2023-11-03 VITALS — BP 114/72 | HR 93 | Ht 60.0 in | Wt 134.0 lb

## 2023-11-03 DIAGNOSIS — K760 Fatty (change of) liver, not elsewhere classified: Secondary | ICD-10-CM

## 2023-11-03 DIAGNOSIS — K5909 Other constipation: Secondary | ICD-10-CM

## 2023-11-03 DIAGNOSIS — R1013 Epigastric pain: Secondary | ICD-10-CM | POA: Diagnosis not present

## 2023-11-03 DIAGNOSIS — R1011 Right upper quadrant pain: Secondary | ICD-10-CM

## 2023-11-03 DIAGNOSIS — R6881 Early satiety: Secondary | ICD-10-CM | POA: Diagnosis not present

## 2023-11-03 DIAGNOSIS — K5904 Chronic idiopathic constipation: Secondary | ICD-10-CM

## 2023-11-03 DIAGNOSIS — G8929 Other chronic pain: Secondary | ICD-10-CM

## 2023-11-03 DIAGNOSIS — Z1211 Encounter for screening for malignant neoplasm of colon: Secondary | ICD-10-CM

## 2023-11-03 LAB — COMPREHENSIVE METABOLIC PANEL WITH GFR
ALT: 24 U/L (ref 0–35)
AST: 27 U/L (ref 0–37)
Albumin: 4.6 g/dL (ref 3.5–5.2)
Alkaline Phosphatase: 76 U/L (ref 39–117)
BUN: 10 mg/dL (ref 6–23)
CO2: 27 meq/L (ref 19–32)
Calcium: 9.6 mg/dL (ref 8.4–10.5)
Chloride: 111 meq/L (ref 96–112)
Creatinine, Ser: 0.67 mg/dL (ref 0.40–1.20)
GFR: 94.04 mL/min (ref >60.00)
Glucose, Bld: 105 mg/dL — ABNORMAL HIGH (ref 70–99)
Potassium: 4 meq/L (ref 3.5–5.1)
Sodium: 145 meq/L (ref 135–145)
Total Bilirubin: 0.4 mg/dL (ref 0.2–1.2)
Total Protein: 7.2 g/dL (ref 6.0–8.3)

## 2023-11-03 LAB — CBC WITH DIFFERENTIAL/PLATELET
Basophils Absolute: 0.1 10*3/uL (ref 0.0–0.1)
Basophils Relative: 1.2 % (ref 0.0–3.0)
Eosinophils Absolute: 0.3 10*3/uL (ref 0.0–0.7)
Eosinophils Relative: 4 % (ref 0.0–5.0)
HCT: 44.9 % (ref 36.0–46.0)
Hemoglobin: 15.1 g/dL — ABNORMAL HIGH (ref 12.0–15.0)
Lymphocytes Relative: 30.9 % (ref 12.0–46.0)
Lymphs Abs: 2.2 10*3/uL (ref 0.7–4.0)
MCHC: 33.7 g/dL (ref 30.0–36.0)
MCV: 90.7 fl (ref 78.0–100.0)
Monocytes Absolute: 0.4 10*3/uL (ref 0.1–1.0)
Monocytes Relative: 5.8 % (ref 3.0–12.0)
Neutro Abs: 4.1 10*3/uL (ref 1.4–7.7)
Neutrophils Relative %: 58.1 % (ref 43.0–77.0)
Platelets: 256 10*3/uL (ref 150.0–400.0)
RBC: 4.96 Mil/uL (ref 3.87–5.11)
RDW: 12.8 % (ref 11.5–15.5)
WBC: 7.1 10*3/uL (ref 4.0–10.5)

## 2023-11-03 MED ORDER — NA SULFATE-K SULFATE-MG SULF 17.5-3.13-1.6 GM/177ML PO SOLN: 1.0000 | Freq: Once | ORAL | 0 refills | Status: AC

## 2023-11-03 NOTE — Patient Instructions (Addendum)
-  Take OTC Miralax powder 17g in a drink daily. -Continue Metamucil 2 tablets twice daily. -Drink 64 ounces of water daily. -High Fiber diet.  Your provider has requested that you go to the basement level for lab work before leaving today. Press "B" on the elevator. The lab is located at the first door on the left as you exit the elevator.   You have been scheduled for an abdominal ultrasound at [facility]iology (1st floor of hospital) on 11/11/23 at 9 am. Please arrive 30 minutes prior to your appointment for registration. Make certain not to have anything to eat or drink after midnight. Should you need to reschedule your appointment, please contact radiology at [phone]. This test typically takes about 30 minutes to perform.  You have been scheduled for an endoscopy and colonoscopy. Please follow the written instructions given to you at your visit today.  If you use inhalers (even only as needed), please bring them with you on the day of your procedure.  DO NOT TAKE 7 DAYS PRIOR TO TEST- Trulicity (dulaglutide) Ozempic, Wegovy (semaglutide) Mounjaro (tirzepatide) Bydureon Bcise (exanatide extended release)  DO NOT TAKE 1 DAY PRIOR TO YOUR TEST Rybelsus (semaglutide) Adlyxin (lixisenatide) Victoza (liraglutide) Byetta (exanatide) _________________________________________________________________________  _______________________________________________________  If your blood pressure at your visit was 140/90 or greater, please contact your primary care physician to follow up on this.  _______________________________________________________  If you are age 65 or older, your body mass index should be between 23-30. Your Body mass index is 26.17 kg/m. If this is out of the aforementioned range listed, please consider follow up with your Primary Care Provider.  If you are age 61 or younger, your body mass index should be between 19-25. Your Body mass index is 26.17 kg/m. If this  is out of the aformentioned range listed, please consider follow up with your Primary Care Provider.   ________________________________________________________  The [place] [facility] providers would like to encourage you to use MYCHART to communicate with providers for non-urgent requests or questions.  Due to long hold times on the telephone, sending your provider a message by [facility] may be a faster and more efficient way to get a response.  Please allow 48 business hours for a response.  Please remember that this is for non-urgent requests.  _______________________________________________________

## 2023-11-06 ENCOUNTER — Encounter: Payer: Self-pay | Admitting: Gastroenterology

## 2023-11-06 ENCOUNTER — Ambulatory Visit (AMBULATORY_SURGERY_CENTER): Admitting: Gastroenterology

## 2023-11-06 VITALS — BP 160/94 | HR 87 | Temp 98.1°F | Resp 22 | Ht 60.0 in | Wt 134.0 lb

## 2023-11-06 DIAGNOSIS — D125 Benign neoplasm of sigmoid colon: Secondary | ICD-10-CM

## 2023-11-06 DIAGNOSIS — K5730 Diverticulosis of large intestine without perforation or abscess without bleeding: Secondary | ICD-10-CM

## 2023-11-06 DIAGNOSIS — D122 Benign neoplasm of ascending colon: Secondary | ICD-10-CM

## 2023-11-06 DIAGNOSIS — K635 Polyp of colon: Secondary | ICD-10-CM

## 2023-11-06 DIAGNOSIS — K5904 Chronic idiopathic constipation: Secondary | ICD-10-CM

## 2023-11-06 DIAGNOSIS — Z1211 Encounter for screening for malignant neoplasm of colon: Secondary | ICD-10-CM

## 2023-11-06 DIAGNOSIS — K3189 Other diseases of stomach and duodenum: Secondary | ICD-10-CM

## 2023-11-06 DIAGNOSIS — K2950 Unspecified chronic gastritis without bleeding: Secondary | ICD-10-CM

## 2023-11-06 DIAGNOSIS — R1011 Right upper quadrant pain: Secondary | ICD-10-CM

## 2023-11-06 MED ORDER — SODIUM CHLORIDE 0.9 % IV SOLN: 500.0000 mL | INTRAVENOUS | Status: DC

## 2023-11-06 NOTE — Progress Notes (Signed)
 Called to room to assist during endoscopic procedure.  Patient ID and intended procedure confirmed with present staff. Received instructions for my participation in the procedure from the performing physician.

## 2023-11-06 NOTE — Patient Instructions (Addendum)
 Thank you for letting us  take care of your healthcare needs today. Please see handouts given to you on Gastritis, Polyps and Diverticulosis.    YOU HAD AN ENDOSCOPIC PROCEDURE TODAY AT THE [facility]:   Refer to the procedure report that was given to you for any specific questions about what was found during the examination.  If the procedure report does not answer your questions, please call your gastroenterologist to clarify.  If you requested that your care partner not be given the details of your procedure findings, then the procedure report has been included in a sealed envelope for you to review at your convenience later.  YOU SHOULD EXPECT: Some feelings of bloating in the abdomen. Passage of more gas than usual.  Walking can help get rid of the air that was put into your GI tract during the procedure and reduce the bloating. If you had a lower endoscopy (such as a colonoscopy or flexible sigmoidoscopy) you may notice spotting of blood in your stool or on the toilet paper. If you underwent a bowel prep for your procedure, you may not have a normal bowel movement for a few days.  Please Note:  You might notice some irritation and congestion in your nose or some drainage.  This is from the oxygen used during your procedure.  There is no need for concern and it should clear up in a day or so.  SYMPTOMS TO REPORT IMMEDIATELY:  Following lower endoscopy (colonoscopy or flexible sigmoidoscopy):  Excessive amounts of blood in the stool  Significant tenderness or worsening of abdominal pains  Swelling of the abdomen that is new, acute  Fever of 100F or higher  Following upper endoscopy (EGD)  Vomiting of blood or coffee ground material  New chest pain or pain under the shoulder blades  Painful or persistently difficult swallowing  New shortness of breath  Fever of 100F or higher  Black, tarry-looking stools  For urgent or emergent issues, a gastroenterologist can be reached  at any hour by calling [phone]. Do not use MyChart messaging for urgent concerns.    DIET:  We do recommend a small meal at first, but then you may proceed to your regular diet.  Drink plenty of fluids but you should avoid alcoholic beverages for 24 hours.  ACTIVITY:  You should plan to take it easy for the rest of today and you should NOT DRIVE or use heavy machinery until tomorrow (because of the sedation medicines used during the test).    FOLLOW UP: Our staff will call the number listed on your records the next business day following your procedure.  We will call around 7:15- 8:00 am to check on you and address any questions or concerns that you may have regarding the information given to you following your procedure. If we do not reach you, we will leave a message.     If any biopsies were taken you will be contacted by phone or by letter within the next 1-3 weeks.  Please call us  at [phone] if you have not heard about the biopsies in 3 weeks.    SIGNATURES/CONFIDENTIALITY: You and/or your care partner have signed paperwork which will be entered into your electronic medical record.  These signatures attest to the fact that that the information above on your After Visit Summary has been reviewed and is understood.  Full responsibility of the confidentiality of this discharge information lies with you and/or your care-partner.

## 2023-11-06 NOTE — Progress Notes (Signed)
 History and Physical Interval Note:  11/06/2023 3:11 PM  [name]  has presented today for endoscopic procedure(s), with the diagnosis of  Encounter Diagnoses  Name Primary?   RUQ abdominal pain Yes   Chronic idiopathic constipation   .  The various methods of evaluation and treatment have been discussed with the patient and/or family. After consideration of risks, benefits and other options for treatment, the patient has consented to  the endoscopic procedure(s).   The patient's history has been reviewed, patient examined, no change in status, stable for endoscopic procedure(s).  I have reviewed the patient's chart and labs.  Questions were answered to the patient's satisfaction.     Tylisa Alcivar E. Cherryl Corona, MD [facility] Gastroenterology

## 2023-11-06 NOTE — Progress Notes (Signed)
 Pt's states no medical or surgical changes since previsit or office visit.

## 2023-11-06 NOTE — Op Note (Signed)
 [facility] Patient Name: [name] Procedure Date: 11/06/2023 3:12 PM MRN: [id number] Endoscopist: Geralyn Knee E. Cherryl Corona , MD, [id number] Age: 63 Referring MD:  Date of Birth: 05/14/1961 Gender: Female Account #: [id number] Procedure:                Colonoscopy Indications:              Screening for colorectal malignant neoplasm, This                            is the patient's first colonoscopy Medicines:                Monitored Anesthesia Care Procedure:                Pre-Anesthesia Assessment:                           - Prior to the procedure, a History and Physical                            was performed, and patient medications and                            allergies were reviewed. The patient's tolerance of                            previous anesthesia was also reviewed. The risks                            and benefits of the procedure and the sedation                            options and risks were discussed with the patient.                            All questions were answered, and informed consent                            was obtained. Prior Anticoagulants: The patient has                            taken no anticoagulant or antiplatelet agents                            except for aspirin. ASA Grade Assessment: II - A                            patient with mild systemic disease. After reviewing                            the risks and benefits, the patient was deemed in                            satisfactory condition to undergo the procedure.  After obtaining informed consent, the colonoscope                            was passed under direct vision. Throughout the                            procedure, the patient's blood pressure, pulse, and                            oxygen saturations were monitored continuously. The                            Olympus Scope SN [id number] was introduced through the                             anus and advanced to the the terminal ileum, with                            identification of the appendiceal orifice and IC                            valve. The colonoscopy was performed without                            difficulty. The patient tolerated the procedure                            well. The quality of the bowel preparation was                            good. The terminal ileum, ileocecal valve,                            appendiceal orifice, and rectum were photographed.                            The bowel preparation used was SUPREP via split                            dose instruction. Scope In: 3:36:31 PM Scope Out: 3:55:08 PM Scope Withdrawal Time: 0 hours 16 minutes 35 seconds  Total Procedure Duration: 0 hours 18 minutes 37 seconds  Findings:                 The perianal and digital rectal examinations were                            normal. Pertinent negatives include normal                            sphincter tone and no palpable rectal lesions.                           A 3 mm polyp was found in the ascending colon. The  polyp was sessile. The polyp was removed with a                            cold snare. Resection and retrieval were complete.                            Estimated blood loss was minimal.                           Two sessile polyps were found in the sigmoid colon.                            The polyps were 4 to 9 mm in size. These polyps                            were removed with a cold snare. Resection and                            retrieval were complete. Estimated blood loss was                            minimal.                           Multiple medium-mouthed and small-mouthed                            diverticula were found in the sigmoid colon. There                            was narrowing of the colon in association with the                            diverticular opening.                            The exam was otherwise normal throughout the                            examined colon.                           The terminal ileum appeared normal.                           The retroflexed view of the distal rectum and anal                            verge was normal and showed no anal or rectal                            abnormalities. Complications:            No immediate complications. Estimated Blood Loss:     Estimated blood loss was minimal. Impression:               -  One 3 mm polyp in the ascending colon, removed                            with a cold snare. Resected and retrieved.                           - Two 4 to 9 mm polyps in the sigmoid colon,                            removed with a cold snare. Resected and retrieved.                           - Mild diverticulosis in the sigmoid colon. There                            was narrowing of the colon in association with the                            diverticular opening.                           - The examined portion of the ileum was normal.                           - The distal rectum and anal verge are normal on                            retroflexion view. Recommendation:           - Patient has a contact number available for                            emergencies. The signs and symptoms of potential                            delayed complications were discussed with the                            patient. Return to normal activities tomorrow.                            Written discharge instructions were provided to the                            patient.                           - Resume previous diet.                           - Continue present medications.                           - Await pathology results.                           -  Repeat colonoscopy (date not yet determined) for                            surveillance based on pathology results. Taelar Gronewold E. Cherryl Corona, MD 11/06/2023 4:14:18 PM This  report has been signed electronically.

## 2023-11-06 NOTE — Op Note (Signed)
 [facility] Patient Name: [name] Procedure Date: 11/06/2023 3:22 PM MRN: [id number] Endoscopist: Geralyn Knee E. Cherryl Corona , MD, [id number] Age: 63 Referring MD:  Date of Birth: 1961/03/24 Gender: Female Account #: [id number] Procedure:                Upper GI endoscopy Indications:              Epigastric abdominal pain, Early satiety Medicines:                Monitored Anesthesia Care Procedure:                Pre-Anesthesia Assessment:                           - Prior to the procedure, a History and Physical                            was performed, and patient medications and                            allergies were reviewed. The patient's tolerance of                            previous anesthesia was also reviewed. The risks                            and benefits of the procedure and the sedation                            options and risks were discussed with the patient.                            All questions were answered, and informed consent                            was obtained. Prior Anticoagulants: The patient has                            taken no anticoagulant or antiplatelet agents. ASA                            Grade Assessment: II - A patient with mild systemic                            disease. After reviewing the risks and benefits,                            the patient was deemed in satisfactory condition to                            undergo the procedure.                           After obtaining informed consent, the endoscope was  passed under direct vision. Throughout the                            procedure, the patient's blood pressure, pulse, and                            oxygen saturations were monitored continuously. The                            GIF [id number] #[id number] was introduced through the                            mouth, and advanced to the second part of duodenum.                            The  upper GI endoscopy was accomplished without                            difficulty. The patient tolerated the procedure                            well. Scope In: Scope Out: Findings:                 The examined portions of the nasopharynx,                            oropharynx and larynx were normal.                           The examined esophagus was normal.                           Diffuse mild inflammation characterized by adherent                            blood was found in the entire examined stomach.                            Biopsies were taken with a cold forceps for                            histology. Estimated blood loss was minimal.                           The pylorus appeared mildly stenotic. This was                            traversed and there was some bleeding at the                            pylorus following passage of the endoscope.                            Biopsies were taken with a cold forceps for  histology. Estimated blood loss was minimal.                           The exam of the stomach was otherwise normal.                           The examined duodenum was normal. Complications:            No immediate complications. Estimated Blood Loss:     Estimated blood loss was minimal. Impression:               - The examined portions of the nasopharynx,                            oropharynx and larynx were normal.                           - Normal esophagus.                           - Gastritis. Biopsied.                           - Gastric stenosis was found at the pylorus.                            Biopsied. This may indicate a history of ulcer at                            the pylorus with subsequent stricture formation.                           - Normal examined duodenum. Recommendation:           - Patient has a contact number available for                            emergencies. The signs and symptoms of potential                             delayed complications were discussed with the                            patient. Return to normal activities tomorrow.                            Written discharge instructions were provided to the                            patient.                           - Resume previous diet.                           - Continue present medications.                           -  Await pathology results.                           - Can consider repeat endoscopy with pyloric                            dilation if biopsies negative for H. pylori and                            symptoms not improving. Jethro Radke E. Cherryl Corona, MD 11/06/2023 4:07:07 PM This report has been signed electronically.

## 2023-11-06 NOTE — Progress Notes (Signed)
 Sedate, gd SR, tolerated procedure well, VSS, report to RN

## 2023-11-07 ENCOUNTER — Telehealth: Payer: Self-pay

## 2023-11-07 NOTE — Telephone Encounter (Signed)
 Attempted to reach patient for follow up phone call. No answer, left voicemail to call Dr. Milus Alpha office for any questions or concerns.

## 2023-11-11 ENCOUNTER — Ambulatory Visit (HOSPITAL_COMMUNITY)
Admission: RE | Admit: 2023-11-11 | Discharge: 2023-11-11 | Disposition: A | Discharge status: Home / Self Care | Source: Ambulatory Visit | Attending: Physician Assistant | Admitting: Physician Assistant

## 2023-11-11 DIAGNOSIS — R1011 Right upper quadrant pain: Secondary | ICD-10-CM | POA: Insufficient documentation

## 2023-11-11 DIAGNOSIS — K760 Fatty (change of) liver, not elsewhere classified: Secondary | ICD-10-CM | POA: Insufficient documentation

## 2023-11-11 LAB — SURGICAL PATHOLOGY

## 2023-11-12 ENCOUNTER — Ambulatory Visit: Payer: Medicare Other

## 2023-11-12 VITALS — BP 114/72 | HR 93 | Ht 61.0 in | Wt 134.0 lb

## 2023-11-12 DIAGNOSIS — Z122 Encounter for screening for malignant neoplasm of respiratory organs: Secondary | ICD-10-CM

## 2023-11-12 DIAGNOSIS — Z0000 Encounter for general adult medical examination without abnormal findings: Secondary | ICD-10-CM

## 2023-11-12 NOTE — Progress Notes (Signed)
 Subjective:   [name] is a 63 y.o. who presents for a Medicare Wellness preventive visit.  Visit Complete: Virtual I connected with  [name] on 11/12/23 by a audio enabled telemedicine application and verified that I am speaking with the correct person using two identifiers.  Patient Location: Home  Provider Location: Home Office  I discussed the limitations of evaluation and management by telemedicine. The patient expressed understanding and agreed to proceed.  Vital Signs: Because this visit was a virtual/telehealth visit, some criteria may be missing or patient reported. Any vitals not documented were not able to be obtained and vitals that have been documented are patient reported.  VideoDeclined- This patient declined Librarian, academic. Therefore the visit was completed with audio only.  Persons Participating in Visit: Patient.  AWV Questionnaire: Yes: Patient Medicare AWV questionnaire was completed by the patient on 11/11/23; I have confirmed that all information answered by patient is correct and no changes since this date.  Cardiac Risk Factors include: advanced age (>7men, >64 women);dyslipidemia;hypertension;smoking/ tobacco exposure     Objective:    Today's Vitals   11/12/23 1336  BP: 114/72  Pulse: 93  Weight: 134 lb (60.8 kg)  Height: 5\' 1"  (1.549 m)  PainSc: 6    Body mass index is 25.32 kg/m.     11/12/2023    1:40 PM 11/02/2021    2:12 PM 11/01/2020    2:51 PM 08/08/2020    5:48 PM  Advanced Directives  Does Patient Have a Medical Advance Directive? No No No No  Would patient like information on creating a medical advance directive?  No - Patient declined Yes (MAU/Ambulatory/Procedural Areas - Information given)     Current Medications (verified) Outpatient Encounter Medications as of 11/12/2023  Medication Sig   amLODipine  (NORVASC ) 5 MG tablet Take 1 tablet (5 mg total) by mouth daily.   aspirin 81 MG  chewable tablet Chew 81 mg by mouth daily.   atorvastatin  (LIPITOR) 40 MG tablet Take 1 tablet (40 mg total) by mouth daily.   celecoxib  (CELEBREX ) 200 MG capsule Take 1 capsule by mouth twice daily (Patient taking differently: Take 200 mg by mouth daily.)   cyclobenzaprine  (FLEXERIL ) 10 MG tablet Take 1 tablet (10 mg total) by mouth 3 (three) times daily as needed for muscle spasms.   fenofibrate  (TRICOR ) 145 MG tablet Take 1 tablet (145 mg total) by mouth daily.   ibuprofen (ADVIL) 200 MG tablet Take 800 mg by mouth every 8 (eight) hours as needed.   montelukast  (SINGULAIR ) 10 MG tablet TAKE 1 TABLET BY MOUTH AT BEDTIME   polyethylene glycol (MIRALAX / GLYCOLAX) 17 g packet Take 17 g by mouth daily as needed.   psyllium (REGULOID) 0.52 g capsule Take 4 capsules by mouth 2 (two) times daily.   zolpidem  (AMBIEN ) 10 MG tablet Take 1 tablet (10 mg total) by mouth at bedtime as needed. for sleep   No facility-administered encounter medications on file as of 11/12/2023.    Allergies (verified) Baclofen    History: Past Medical History:  Diagnosis Date   HTN (hypertension)    Hyperlipidemia    Migraines    Past Surgical History:  Procedure Laterality Date   CHOLECYSTECTOMY     FOOT SURGERY Right 2017   ligament   SHOULDER ARTHROSCOPY WITH ROTATOR CUFF REPAIR Right 2021   TUBAL LIGATION     Family History  Problem Relation Age of Onset   Diabetes Mother  Heart disease Father    Diabetes Father    Healthy Sister    Healthy Sister    Healthy Brother    Breast cancer Other    Colon cancer Neg Hx    Esophageal cancer Neg Hx    Stomach cancer Neg Hx    Rectal cancer Neg Hx    Social History   Socioeconomic History   Marital status: Divorced    Spouse name: Not on file   Number of children: 3   Years of education: Not on file   Highest education level: GED or equivalent  Occupational History   Occupation: disability  Tobacco Use   Smoking status: Every Day    Current  packs/day: 1.00    Average packs/day: 1 pack/day for 44.5 years (44.5 ttl pk-yrs)    Types: Cigarettes    Start date: 10/02/1983   Smokeless tobacco: Never   Tobacco comments:    Has been smoking 0.5-1 ppd since age 68  Vaping Use   Vaping status: Never Used  Substance and Sexual Activity   Alcohol use: No   Drug use: No   Sexual activity: Not on file  Other Topics Concern   Not on file  Social History Narrative   Lives alone, one child in [place] , one in [place], son in [place]; parents live 2 miles away   Social Drivers of Health   Financial Resource Strain: Medium Risk (11/12/2023)   Overall Financial Resource Strain (CARDIA)    Difficulty of Paying Living Expenses: Somewhat hard  Food Insecurity: No Food Insecurity (11/12/2023)   Hunger Vital Sign    Worried About Running Out of Food in the Last Year: Never true    Ran Out of Food in the Last Year: Never true  Transportation Needs: No Transportation Needs (11/12/2023)   PRAPARE - Administrator, Civil Service (Medical): No    Lack of Transportation (Non-Medical): No  Physical Activity: Insufficiently Active (11/12/2023)   Exercise Vital Sign    Days of Exercise per Week: 3 days    Minutes of Exercise per Session: 10 min  Stress: No Stress Concern Present (11/12/2023)   [employer] of Occupational Health - Occupational Stress Questionnaire    Feeling of Stress : Not at all  Social Connections: Socially Isolated (11/12/2023)   Social Connection and Isolation Panel [NHANES]    Frequency of Communication with Friends and Family: More than three times a week    Frequency of Social Gatherings with Friends and Family: More than three times a week    Attends Religious Services: Never    Database administrator or Organizations: No    Attends Engineer, structural: Never    Marital Status: Divorced    Tobacco Counseling Ready to quit: Not Answered Counseling given: Yes Tobacco comments: Has been smoking  0.5-1 ppd since age 78    Clinical Intake:  Pre-visit preparation completed: Yes  Pain : 0-10 (lower right side abdomen) Pain Score: 6  (pt is not taking anything) Pain Relieving Factors: none Effect of Pain on Daily Activities: none  Pain Relieving Factors: none  BMI - recorded: 26.17 Nutritional Status: BMI 25 -29 Overweight Nutritional Risks: None Diabetes: No  Lab Results  Component Value Date   HGBA1C 6.2 (H) 11/07/2022   HGBA1C 7.0 (H) 05/09/2022     How often do you need to have someone help you when you read instructions, pamphlets, or other written materials from your doctor or pharmacy?: 1 -  Never  Interpreter Needed?: No  Information entered by :: Alia T/cma   Activities of Daily Living     11/11/2023   12:04 PM  In your present state of health, do you have any difficulty performing the following activities:  Hearing? 0  Vision? 0  Difficulty concentrating or making decisions? 0  Walking or climbing stairs? 0  Dressing or bathing? 0  Doing errands, shopping? 0  Preparing Food and eating ? N  Using the Toilet? N  In the past six months, have you accidently leaked urine? N  Do you have problems with loss of bowel control? N  Managing your Medications? N  Housekeeping or managing your Housekeeping? N    Patient Care Team: Delfina Feller, FNP as PCP - General (Family Medicine) Hyland Mailman, MD as Referring Physician (Optometry) [facility] Delilah Fend, [facility] as Pharmacist (Family Medicine)  Indicate any recent Medical Services you may have received from other than [facility] providers in the past year (date may be approximate).     Assessment:   This is a routine wellness examination for [place].  Hearing/Vision screen Hearing Screening - Comments:: Pt denies hearing dif Vision Screening - Comments:: pt wears contacts--Dr. Candi Chafe in [place], [place]   Goals Addressed             This Visit's Progress    Exercise  3x per week (30 min per time)   On track    Be more active Wants to be pain free by next year  Goals Addressed             This Visit's Progress    Exercise 3x per week (30 min per time)   On track    Be more active Wants to be pain free by next year                 Depression Screen     05/27/2023   10:44 AM 05/05/2023    3:03 PM 11/07/2022    2:07 PM 11/06/2022    2:38 PM 05/09/2022    2:15 PM 11/02/2021    2:10 PM 05/09/2021    3:35 PM  PHQ 2/9 Scores  PHQ - 2 Score 0 0 0 0 0 0 0  PHQ- 9 Score 0 0 0  0  0    Fall Risk     11/11/2023   12:04 PM 05/27/2023   10:44 AM 05/05/2023    3:03 PM 11/06/2022    2:40 PM 05/09/2022    2:14 PM  Fall Risk   Falls in the past year? 0 0 0 0 0  Number falls in past yr: 0   0   Injury with Fall? 0   0   Risk for fall due to :    No Fall Risks   Follow up  Falls evaluation completed  Falls prevention discussed;Education provided;Falls evaluation completed     MEDICARE RISK AT HOME:  Medicare Risk at Home Any stairs in or around the home?: (Patient-Rptd) Yes If so, are there any without handrails?: (Patient-Rptd) No Home free of loose throw rugs in walkways, pet beds, electrical cords, etc?: (Patient-Rptd) Yes Adequate lighting in your home to reduce risk of falls?: (Patient-Rptd) No Life alert?: (Patient-Rptd) No Use of a cane, walker or w/c?: (Patient-Rptd) No Grab bars in the bathroom?: (Patient-Rptd) No Shower chair or bench in shower?: (Patient-Rptd) No Elevated toilet seat or a handicapped toilet?: (Patient-Rptd) No  TIMED UP AND  GO:  Was the test performed?  No  Cognitive Function: 6CIT completed        11/12/2023    1:46 PM 11/06/2022    2:41 PM 11/02/2021    2:17 PM  6CIT Screen  What Year? 0 points 0 points 0 points  What month? 0 points 0 points 0 points  What time? 0 points 0 points 0 points  Count back from 20 0 points 0 points 0 points  Months in reverse 0 points 0 points 0 points  Repeat phrase 0 points  0 points 0 points  Total Score 0 points 0 points 0 points    Immunizations Immunization History  Administered Date(s) Administered   Hepatitis A 09/15/2008, 10/17/2008   Hepatitis B 09/15/2008, 10/17/2008   Influenza, Seasonal, Injecte, Preservative Fre 05/05/2023   Influenza,inj,Quad PF,6+ Mos 05/09/2021, 05/09/2022   MMR 07/29/2008   Moderna Sars-Covid-2 Vaccination 10/17/2019, 11/30/2019, 01/04/2020   Td 10/28/2007   Tdap 10/28/2007, 11/29/2020    Screening Tests Health Maintenance  Topic Date Due   Hepatitis C Screening  Never done   Pneumococcal Vaccine 89-53 Years old (1 of 2 - PCV) Never done   Zoster Vaccines- Shingrix (1 of 2) Never done   Lung Cancer Screening  Never done   COVID-19 Vaccine (4 - 2024-25 season) 11/27/2024 (Originally 03/09/2023)   Fecal DNA (Cologuard)  11/13/2023   INFLUENZA VACCINE  02/06/2024   MAMMOGRAM  05/27/2024   Cervical Cancer Screening (HPV/Pap Cotest)  08/12/2024   Medicare Annual Wellness (AWV)  11/11/2024   DTaP/Tdap/Td (4 - Td or Tdap) 11/30/2030   HIV Screening  Completed   HPV VACCINES  Aged Out   Meningococcal B Vaccine  Aged Out   Colonoscopy  Discontinued    Health Maintenance  Health Maintenance Due  Topic Date Due   Hepatitis C Screening  Never done   Pneumococcal Vaccine 39-60 Years old (1 of 2 - PCV) Never done   Zoster Vaccines- Shingrix (1 of 2) Never done   Lung Cancer Screening  Never done   Health Maintenance Items Addressed: See Nurse Notes  Additional Screening:  Vision Screening: Recommended annual ophthalmology exams for early detection of glaucoma and other disorders of the eye.  Dental Screening: Recommended annual dental exams for proper oral hygiene  Community Resource Referral / Chronic Care Management: CRR required this visit?  Yes   CCM required this visit?  No     Plan:     I have personally reviewed and noted the following in the patient's chart:   Medical and social history Use of  alcohol, tobacco or illicit drugs  Current medications and supplements including opioid prescriptions. Patient is not currently taking opioid prescriptions. Functional ability and status Nutritional status Physical activity Advanced directives List of other physicians Hospitalizations, surgeries, and ER visits in previous 12 months Vitals Screenings to include cognitive, depression, and falls Referrals and appointments  In addition, I have reviewed and discussed with patient certain preventive protocols, quality metrics, and best practice recommendations. A written personalized care plan for preventive services as well as general preventive health recommendations were provided to patient.     Michaelle Adolphus, CMA   11/12/2023   After Visit Summary: (MyChart) Due to this being a telephonic visit, the after visit summary with patients personalized plan was offered to patient via MyChart   Notes: Clinician Recommendations: Please remember to get the following done at your next visit: Shingles and Pneumonia vaccines. Plus Hepatitis C Screening and Lung  Cancer screening

## 2023-11-12 NOTE — Patient Instructions (Signed)
 Ms. [name] , Thank you for taking time to come for your Medicare Wellness Visit. I appreciate your ongoing commitment to your health goals. Please review the following plan we discussed and let me know if I can assist you in the future.   Referrals/Orders/Follow-Ups/Clinician Recommendations: Please remember to get the following done at your next visit: Shingles and Pneumonia vaccines. Plus Hepatitis C Screening and Lung Cancer screening   This is a list of the screening recommended for you and due dates:  Health Maintenance  Topic Date Due   Hepatitis C Screening  Never done   Pneumococcal Vaccination (1 of 2 - PCV) Never done   Zoster (Shingles) Vaccine (1 of 2) Never done   Screening for Lung Cancer  Never done   COVID-19 Vaccine (4 - 2024-25 season) 11/27/2024*   Cologuard (Stool DNA test)  11/13/2023   Flu Shot  02/06/2024   Mammogram  05/27/2024   Pap with HPV screening  08/12/2024   Medicare Annual Wellness Visit  11/11/2024   DTaP/Tdap/Td vaccine (4 - Td or Tdap) 11/30/2030   HIV Screening  Completed   HPV Vaccine  Aged Out   Meningitis B Vaccine  Aged Out   Colon Cancer Screening  Discontinued  *Topic was postponed. The date shown is not the original due date.    Advanced directives: (Declined) Advance directive discussed with you today. Even though you declined this today, please call our office should you change your mind, and we can give you the proper paperwork for you to fill out.  Next Medicare Annual Wellness Visit scheduled for next year: Yes

## 2023-11-14 ENCOUNTER — Encounter: Payer: Self-pay | Admitting: Gastroenterology

## 2023-11-14 ENCOUNTER — Telehealth: Payer: Self-pay

## 2023-11-14 NOTE — Progress Notes (Signed)
 Complex Care Management Note  Care Guide Note 11/14/2023 Name: [name] MRN: [id number] DOB: 11-05-1960  [name] is a 63 y.o. year old female who sees Delfina Feller, FNP for primary care. I reached out to [name] by phone today to offer complex care management services.  [name] was given information about Complex Care Management services today including:   The Complex Care Management services include support from the care team which includes your Nurse Care Manager, Clinical Social Worker, or Pharmacist.  The Complex Care Management team is here to help remove barriers to the health concerns and goals most important to you. Complex Care Management services are voluntary, and the patient may decline or stop services at any time by request to their care team member.   Complex Care Management Consent Status: Patient agreed to services and verbal consent obtained.   Follow up plan:  Telephone appointment with complex care management team member scheduled for:  11/17/2023  Encounter Outcome:  Patient Scheduled  Lenton Rail , RMA     [place]  [facility], [facility] Guide  Direct Dial: [phone]  Website: [email or web address]

## 2023-11-14 NOTE — Progress Notes (Signed)
 Ms. [name],  The biopsies taken from your stomach were notable for mild reactive gastropathy which is a common finding and often related to use of certain medications (usually NSAIDs), but there was no evidence of Helicobacter pylori infection. This common finding is not felt to necessarily be a cause of any particular symptom and there is no specific treatment or further evaluation recommended. As discussed, the muscle at the exit of your stomach seemed narrowed, maybe indicating scar tissue from a previous ulcer.  If your symptoms are not improving, we can try stretching out this muscle to see if this helps your symptoms.  Please contact us  in the 4-6 weeks and let us  know if you would like to have this done.  Two polyps which I removed during your colonoscopy were proven to be completely benign but are considered "pre-cancerous" polyps that MAY have grown into cancer if they had not been removed.  One polyp was not precancerous.  Studies shows that at least 20% of women over age 77 and 30% of men over age 96 have pre-cancerous polyps.  Based on current nationally recognized surveillance guidelines, I recommend that you have a repeat colonoscopy in 7 years.   If you develop any new rectal bleeding, abdominal pain or significant bowel habit changes, please contact me before then.

## 2023-11-17 ENCOUNTER — Other Ambulatory Visit: Payer: Self-pay

## 2023-11-17 NOTE — Progress Notes (Signed)
 Agree with the assessment and plan as outlined by Brigitte Canard, PA-C.

## 2023-11-17 NOTE — Patient Instructions (Signed)
 Visit Information  Thank you for taking time to visit with me today. Please don't hesitate to contact me if I can be of assistance to you before our next scheduled appointment.   Following is a copy of your care plan:   Goals Addressed             This Visit's Progress    VBCI Social Work Care Plan-BSW       Problems:   Air traffic controller Insecurity   CSW Clinical Goal(s):   Over the next 14 days the Patient will will follow up with Medicare, other food banks as directed by Social Work.  Interventions:  Social Determinants of Health in Patient with HTN: SDOH assessments completed: Financial Strain  and Food Insecurity  Evaluation of current treatment plan related to unmet needs Medicare to discuss options for Medicare Part D eligibility/plans and contact other food banks to obtain additional assistance.  Patient Goals/Self-Care Activities:  Patient will contact Medicare and additional food banks for assistance.  Plan:   No further follow up required: Patient does not request a follow up visit.        Please call 911 if you are experiencing a Mental Health or Behavioral Health Crisis or need someone to talk to.  Patient verbalizes understanding of instructions and care plan provided today and agrees to view in MyChart. Active MyChart status and patient understanding of how to access instructions and care plan via MyChart confirmed with patient.     [name], BSW [place]  [facility], [facility] Social Worker Direct Dial: [phone]  Fax: [phone] Website: [email or web address]

## 2023-11-17 NOTE — Patient Outreach (Signed)
 Complex Care Management   Visit Note  11/17/2023  Name:  [name] MRN: [id number] DOB: September 15, 1960  Situation: Referral received for Complex Care Management related to SDOH Barriers:  Food insecurity Financial Resource Strain I obtained verbal consent from Patient.  Visit completed with patient  on the phone  Background:   Past Medical History:  Diagnosis Date   HTN (hypertension)    Hyperlipidemia    Migraines     Assessment:  Patient reports she is supporting her son after he returned home to assist after patient lost her other son.  Currently patient has the only income in the home and she runs low about 2 weeks before her benefits arrive.  Patient is able to pay her bills but has a lot of medical bills.  Patient receives $183 in foodstamps.  Patient has a food Radiographer, therapeutic, but is only using one resource.  SW does recommend patient contact another food bank for additional help, contact Medicare to confirm when she is eligible and plan options, DSS-LIEAP for energy assistance, and Holiday representative for emergency financial assistance.  SW suggests grief counseling and RN-CM but patient declined.  SDOH Interventions    Flowsheet Row Patient Outreach Telephone from 11/17/2023 in [facility] Clinical Support from 11/12/2023 in [facility] Clinical Support from 11/06/2022 in [facility] Clinical Support from 11/02/2021 in [facility] Clinical Support from 11/01/2020 in [facility]  SDOH Interventions       Food Insecurity Interventions Intervention Not Indicated  [Has foodstamps and use food banks] Intervention Not Indicated Other (Comment)  [CRR] Patient Refused  [name] was advised to call us  if she is unable to get food, meds or pay utilities and we will have CCM see what assistance they can find to help.] Patient Refused  [name] was  advised to call us  if she is unable to get food, meds or pay utilities and we will have CCM see what assistance they can find to help.]  Housing Interventions Intervention Not Indicated Intervention Not Indicated Intervention Not Indicated Intervention Not Indicated --  Transportation Interventions Intervention Not Indicated  [Has a car] Intervention Not Indicated Intervention Not Indicated Intervention Not Indicated --  Utilities Interventions Intervention Not Indicated Intervention Not Indicated Intervention Not Indicated -- --  Alcohol Usage Interventions -- Intervention Not Indicated (Score <7) Intervention Not Indicated (Score <7) -- --  Financial Strain Interventions Intervention Not Indicated Intervention Not Indicated Other (Comment)  [CRR] Intervention Not Indicated --  Physical Activity Interventions -- Intervention Not Indicated Intervention Not Indicated Intervention Not Indicated --  Stress Interventions -- Intervention Not Indicated Intervention Not Indicated Intervention Not Indicated --  Social Connections Interventions -- Intervention Not Indicated Patient Refused, Intervention Not Indicated Patient Refused --  Health Literacy Interventions -- Intervention Not Indicated -- -- --         Recommendation:   No provider recommendations. Patient declined referral for RN-CM.  Follow Up Plan:   Patient has met all care management goals. Care Management case will be closed. Patient has been provided contact information should new needs arise.   [name], BSW [place]  [facility], [facility] Social Worker Direct Dial: [phone]  Fax: [phone] Website: [email or web address]

## 2023-11-18 ENCOUNTER — Ambulatory Visit (INDEPENDENT_AMBULATORY_CARE_PROVIDER_SITE_OTHER): Admitting: Nurse Practitioner

## 2023-11-18 ENCOUNTER — Encounter: Payer: Self-pay | Admitting: Nurse Practitioner

## 2023-11-18 VITALS — BP 129/79 | HR 80 | Temp 97.4°F | Ht 61.0 in | Wt 133.0 lb

## 2023-11-18 DIAGNOSIS — E785 Hyperlipidemia, unspecified: Secondary | ICD-10-CM | POA: Diagnosis not present

## 2023-11-18 DIAGNOSIS — I10 Essential (primary) hypertension: Secondary | ICD-10-CM

## 2023-11-18 DIAGNOSIS — F5101 Primary insomnia: Secondary | ICD-10-CM | POA: Diagnosis not present

## 2023-11-18 MED ORDER — ZOLPIDEM TARTRATE 10 MG PO TABS: 10.0000 mg | ORAL_TABLET | Freq: Every evening | ORAL | 1 refills | Status: DC | PRN

## 2023-11-18 MED ORDER — FENOFIBRATE 145 MG PO TABS: 145.0000 mg | ORAL_TABLET | Freq: Every day | ORAL | 1 refills | Status: DC

## 2023-11-18 MED ORDER — AMLODIPINE BESYLATE 5 MG PO TABS: 5.0000 mg | ORAL_TABLET | Freq: Every day | ORAL | 1 refills | Status: DC

## 2023-11-18 MED ORDER — ATORVASTATIN CALCIUM 40 MG PO TABS
40.0000 mg | ORAL_TABLET | Freq: Every day | ORAL | 1 refills | Status: DC
Start: 2023-11-18 — End: 2024-03-30

## 2023-11-18 NOTE — Progress Notes (Signed)
 Subjective:    Patient ID: [name], female    DOB: 1961-02-20, 63 y.o.   MRN: [id number]   Chief Complaint: medical management of chronic issues     HPI:  [name] is a 63 y.o. who identifies as a female who was assigned female at birth.   Social history: Lives with: her son lives with her Work history: retired   Water engineer in today for follow up of the following chronic medical issues:  1. Essential hypertension, benign No c/o chest pain, sob or headache. Does not check blood pressure at home. BP Readings from Last 3 Encounters:  11/12/23 114/72  11/06/23 (!) 160/94  11/03/23 114/72     2. Hyperlipidemia with target LDL less than 100 Does try to watch diet does no exercise. Lab Results  Component Value Date   CHOL 136 11/07/2022   HDL 27 (L) 11/07/2022   LDLCALC 77 11/07/2022   TRIG 185 (H) 11/07/2022   CHOLHDL 5.0 (H) 11/07/2022     3. Primary insomnia Is on ambien  and is not able to sleep well without meds  4. Vitamin D  deficiency Is on daily viatmin d supplement  5. Spinal stenosis of cervical region Has chronic back pain. Is on flexeril  and celebrex  which usually help.   New complaints: None today  Allergies  Allergen Reactions   Baclofen  Nausea And Vomiting   Outpatient Encounter Medications as of 11/18/2023  Medication Sig   amLODipine  (NORVASC ) 5 MG tablet Take 1 tablet (5 mg total) by mouth daily.   aspirin 81 MG chewable tablet Chew 81 mg by mouth daily.   atorvastatin  (LIPITOR) 40 MG tablet Take 1 tablet (40 mg total) by mouth daily.   celecoxib  (CELEBREX ) 200 MG capsule Take 1 capsule by mouth twice daily (Patient taking differently: Take 200 mg by mouth daily.)   cyclobenzaprine  (FLEXERIL ) 10 MG tablet Take 1 tablet (10 mg total) by mouth 3 (three) times daily as needed for muscle spasms.   fenofibrate  (TRICOR ) 145 MG tablet Take 1 tablet (145 mg total) by mouth daily.   ibuprofen (ADVIL) 200 MG tablet Take 800 mg by mouth  every 8 (eight) hours as needed.   montelukast  (SINGULAIR ) 10 MG tablet TAKE 1 TABLET BY MOUTH AT BEDTIME   polyethylene glycol (MIRALAX / GLYCOLAX) 17 g packet Take 17 g by mouth daily as needed.   psyllium (REGULOID) 0.52 g capsule Take 4 capsules by mouth 2 (two) times daily.   zolpidem  (AMBIEN ) 10 MG tablet Take 1 tablet (10 mg total) by mouth at bedtime as needed. for sleep   No facility-administered encounter medications on file as of 11/18/2023.    Past Surgical History:  Procedure Laterality Date   CHOLECYSTECTOMY     FOOT SURGERY Right 2017   ligament   SHOULDER ARTHROSCOPY WITH ROTATOR CUFF REPAIR Right 2021   TUBAL LIGATION      Family History  Problem Relation Age of Onset   Diabetes Mother    Heart disease Father    Diabetes Father    Healthy Sister    Healthy Sister    Healthy Brother    Breast cancer Other    Colon cancer Neg Hx    Esophageal cancer Neg Hx    Stomach cancer Neg Hx    Rectal cancer Neg Hx       Controlled substance contract: n/a     Review of Systems  Constitutional:  Negative for diaphoresis.  Eyes:  Negative for pain.  Respiratory:  Negative for shortness of breath.   Cardiovascular:  Negative for chest pain, palpitations and leg swelling.  Gastrointestinal:  Negative for abdominal pain.  Endocrine: Negative for polydipsia.  Skin:  Negative for rash.  Neurological:  Negative for dizziness, weakness and headaches.  Hematological:  Does not bruise/bleed easily.  All other systems reviewed and are negative.      Objective:   Physical Exam Vitals and nursing note reviewed.  Constitutional:      General: She is not in acute distress.    Appearance: Normal appearance. She is well-developed.  HENT:     Head: Normocephalic.     Right Ear: Tympanic membrane normal.     Left Ear: Tympanic membrane normal.     Nose: Nose normal.     Mouth/Throat:     Mouth: Mucous membranes are moist.  Eyes:     Pupils: Pupils are equal, round,  and reactive to light.  Neck:     Vascular: No carotid bruit or JVD.  Cardiovascular:     Rate and Rhythm: Normal rate and regular rhythm.     Heart sounds: Normal heart sounds.  Pulmonary:     Effort: Pulmonary effort is normal. No respiratory distress.     Breath sounds: Normal breath sounds. No wheezing or rales.  Chest:     Chest wall: No tenderness.  Abdominal:     General: Bowel sounds are normal. There is no distension or abdominal bruit.     Palpations: Abdomen is soft. There is no hepatomegaly, splenomegaly, mass or pulsatile mass.     Tenderness: There is no abdominal tenderness.  Musculoskeletal:        General: Normal range of motion.     Cervical back: Normal range of motion and neck supple.  Lymphadenopathy:     Cervical: No cervical adenopathy.  Skin:    General: Skin is warm and dry.  Neurological:     Mental Status: She is alert and oriented to person, place, and time.     Deep Tendon Reflexes: Reflexes are normal and symmetric.  Psychiatric:        Behavior: Behavior normal.        Thought Content: Thought content normal.        Judgment: Judgment normal.     LMP 07/17/2012   BP 129/79   Pulse 80   Temp (!) 97.4 F (36.3 C) (Temporal)   Ht 5\' 1"  (1.549 m)   Wt 133 lb (60.3 kg)   LMP 07/17/2012   SpO2 97%   BMI 25.13 kg/m    Assessment & Plan:   [name] comes in today with chief complaint of medical management of chronic issues    Diagnosis and orders addressed:  1. Essential hypertension, benign Low sodium diet - EKG 12-Lead - amLODipine  (NORVASC ) 5 MG tablet; Take 1 tablet (5 mg total) by mouth daily.  Dispense: 90 tablet; Refill: 1 - CBC with Differential/Platelet - CMP14+EGFR  2. Hyperlipidemia with target LDL less than 100 Low fat diet - atorvastatin  (LIPITOR) 40 MG tablet; Take 1 tablet (40 mg total) by mouth daily.  Dispense: 90 tablet; Refill: 1 - fenofibrate  (TRICOR ) 145 MG tablet; Take 1 tablet (145 mg total) by mouth  daily.  Dispense: 90 tablet; Refill: 1 - Lipid panel  3. Primary insomnia Bedtime routine - zolpidem  (AMBIEN ) 10 MG tablet; Take 1 tablet (10 mg total) by mouth at bedtime as needed. for sleep  Dispense: 90 tablet; Refill: 1  4. Vitamin D  deficiency Daily vitamin d  supplement  5. Spinal stenosis of cervical region Flexeril  as needed   Labs pending Health Maintenance reviewed Diet and exercise encouraged  Follow up plan: 6 months   Mary-Margaret Gaylyn Keas, FNP

## 2023-11-18 NOTE — Patient Instructions (Signed)
 Insomnia Insomnia is a sleep disorder that makes it difficult to fall asleep or stay asleep. Insomnia can cause fatigue, low energy, difficulty concentrating, mood swings, and poor performance at work or school. There are three different ways to classify insomnia: Difficulty falling asleep. Difficulty staying asleep. Waking up too early in the morning. Any type of insomnia can be long-term (chronic) or short-term (acute). Both are common. Short-term insomnia usually lasts for 3 months or less. Chronic insomnia occurs at least three times a week for longer than 3 months. What are the causes? Insomnia may be caused by another condition, situation, or substance, such as: Having certain mental health conditions, such as anxiety and depression. Using caffeine, alcohol, tobacco, or drugs. Having gastrointestinal conditions, such as gastroesophageal reflux disease (GERD). Having certain medical conditions. These include: Asthma. Alzheimer's disease. Stroke. Chronic pain. An overactive thyroid gland (hyperthyroidism). Other sleep disorders, such as restless legs syndrome and sleep apnea. Menopause. Sometimes, the cause of insomnia may not be known. What increases the risk? Risk factors for insomnia include: Gender. Females are affected more often than males. Age. Insomnia is more common as people get older. Stress and certain medical and mental health conditions. Lack of exercise. Having an irregular work schedule. This may include working night shifts and traveling between different time zones. What are the signs or symptoms? If you have insomnia, the main symptom is having trouble falling asleep or having trouble staying asleep. This may lead to other symptoms, such as: Feeling tired or having low energy. Feeling nervous about going to sleep. Not feeling rested in the morning. Having trouble concentrating. Feeling irritable, anxious, or depressed. How is this diagnosed? This condition  may be diagnosed based on: Your symptoms and medical history. Your health care provider may ask about: Your sleep habits. Any medical conditions you have. Your mental health. A physical exam. How is this treated? Treatment for insomnia depends on the cause. Treatment may focus on treating an underlying condition that is causing the insomnia. Treatment may also include: Medicines to help you sleep. Counseling or therapy. Lifestyle adjustments to help you sleep better. Follow these instructions at home: Eating and drinking  Limit or avoid alcohol, caffeinated beverages, and products that contain nicotine and tobacco, especially close to bedtime. These can disrupt your sleep. Do not eat a large meal or eat spicy foods right before bedtime. This can lead to digestive discomfort that can make it hard for you to sleep. Sleep habits  Keep a sleep diary to help you and your health care provider figure out what could be causing your insomnia. Write down: When you sleep. When you wake up during the night. How well you sleep and how rested you feel the next day. Any side effects of medicines you are taking. What you eat and drink. Make your bedroom a dark, comfortable place where it is easy to fall asleep. Put up shades or blackout curtains to block light from outside. Use a white noise machine to block noise. Keep the temperature cool. Limit screen use before bedtime. This includes: Not watching TV. Not using your smartphone, tablet, or computer. Stick to a routine that includes going to bed and waking up at the same times every day and night. This can help you fall asleep faster. Consider making a quiet activity, such as reading, part of your nighttime routine. Try to avoid taking naps during the day so that you sleep better at night. Get out of bed if you are still awake after  15 minutes of trying to sleep. Keep the lights down, but try reading or doing a quiet activity. When you feel  sleepy, go back to bed. General instructions Take over-the-counter and prescription medicines only as told by your health care provider. Exercise regularly as told by your health care provider. However, avoid exercising in the hours right before bedtime. Use relaxation techniques to manage stress. Ask your health care provider to suggest some techniques that may work well for you. These may include: Breathing exercises. Routines to release muscle tension. Visualizing peaceful scenes. Make sure that you drive carefully. Do not drive if you feel very sleepy. Keep all follow-up visits. This is important. Contact a health care provider if: You are tired throughout the day. You have trouble in your daily routine due to sleepiness. You continue to have sleep problems, or your sleep problems get worse. Get help right away if: You have thoughts about hurting yourself or someone else. Get help right away if you feel like you may hurt yourself or others, or have thoughts about taking your own life. Go to your nearest emergency room or: Call 911. Call the National Suicide Prevention Lifeline at (906)021-1611 or 988. This is open 24 hours a day. Text the Crisis Text Line at 325-069-2793. Summary Insomnia is a sleep disorder that makes it difficult to fall asleep or stay asleep. Insomnia can be long-term (chronic) or short-term (acute). Treatment for insomnia depends on the cause. Treatment may focus on treating an underlying condition that is causing the insomnia. Keep a sleep diary to help you and your health care provider figure out what could be causing your insomnia. This information is not intended to replace advice given to you by your health care provider. Make sure you discuss any questions you have with your health care provider. Document Revised: 06/04/2021 Document Reviewed: 06/04/2021 Elsevier Patient Education  2024 [employer].

## 2023-11-18 NOTE — Addendum Note (Signed)
 Addended by: Delfina Feller on: 11/18/2023 09:49 AM   Modules accepted: Level of Service

## 2023-11-21 LAB — TOXASSURE SELECT 13 (MW), URINE

## 2023-11-25 ENCOUNTER — Ambulatory Visit: Payer: Medicare Other | Admitting: Nurse Practitioner

## 2023-12-17 ENCOUNTER — Telehealth: Payer: Self-pay

## 2023-12-17 DIAGNOSIS — Z87891 Personal history of nicotine dependence: Secondary | ICD-10-CM

## 2023-12-17 DIAGNOSIS — Z122 Encounter for screening for malignant neoplasm of respiratory organs: Secondary | ICD-10-CM

## 2023-12-17 DIAGNOSIS — F17210 Nicotine dependence, cigarettes, uncomplicated: Secondary | ICD-10-CM

## 2023-12-17 NOTE — Telephone Encounter (Signed)
.  Lung Cancer Screening Narrative/Criteria Questionnaire (Cigarette Smokers Only- No Cigars/Pipes/vapes)   [name]   SDMV:12/30/2023 with Rice Chamorro at 9:00 am     [birth year]   LDCT: 12/31/2023 at 11:00 am at [facility]    63 y.o.   Phone: [phone]  Lung Screening Narrative (confirm age 105-77 yrs Medicare / 50-80 yrs Private pay insurance)   Insurance information: Medicare   Referring Provider:Martin, NP   This screening involves an initial phone call with a team member from our program. It is called a shared decision making visit. The initial meeting is required by  insurance and Medicare to make sure you understand the program. This appointment takes about 15-20 minutes to complete. You will complete the screening scan at your scheduled date/time.  This scan takes about 5-10 minutes to complete. You can eat and drink normally before and after the scan.  Criteria questions for Lung Cancer Screening:   Are you a current or former smoker? Current Age began smoking: 15   If you are a former smoker, what year did you quit smoking?Never quit (within 15 yrs)   To calculate your smoking history, I need an accurate estimate of how many packs of cigarettes you smoked per day and for how many years. (Not just the number of PPD you are now smoking)   Years smoking 47 x Packs per day 1.5 = Pack years 70.5   (at least 20 pack yrs)   (Make sure they understand that we need to know how much they have smoked in the past, not just the number of PPD they are smoking now)  Do you have a personal history of cancer?  No    Do you have a family history of cancer? Yes  (cancer type and and relative) Great grandmother breast and Great grandmother with breast or badom unsure  Are you coughing up blood?  No  Have you had unexplained weight loss of 15 lbs or more in the last 6 months? No  It looks like you meet all criteria.  When would be a good time for us  to schedule you for this screening?   Additional  information: N/A

## 2023-12-30 ENCOUNTER — Encounter: Payer: Self-pay | Admitting: Physician Assistant

## 2023-12-30 ENCOUNTER — Ambulatory Visit (INDEPENDENT_AMBULATORY_CARE_PROVIDER_SITE_OTHER): Admitting: Physician Assistant

## 2023-12-30 DIAGNOSIS — F17210 Nicotine dependence, cigarettes, uncomplicated: Secondary | ICD-10-CM

## 2023-12-30 NOTE — Progress Notes (Signed)
 Virtual Visit via Telephone Note  I connected with [name] on 12/30/23 at  9:00 AM by telephone and verified that I am speaking with the correct person using two identifiers.  Location: Patient: home Provider: working virtually from home   I discussed the limitations, risks, security and privacy concerns of performing an evaluation and management service by telephone and the availability of in person appointments. I also discussed with the patient that there may be a patient responsible charge related to this service. The patient expressed understanding and agreed to proceed.     Shared Decision Making Visit Lung Cancer Screening Program [phone])   Eligibility: Age 63 Pack Years Smoking History Calculation 70.5 (# packs/per year x # years smoked) Recent History of coughing up blood  No Unexplained weight loss? No ( >Than 15 pounds within the last 6 months ) Prior History Lung / other cancer No (Diagnosis within the last 5 years already requiring surveillance chest CT Scans). Smoking Status Current Smoker  Visit Components: Discussion included one or more decision making aids. Yes Discussion included risk/benefits of screening. Yes Discussion included potential follow up diagnostic testing for abnormal scans. Yes Discussion included meaning and risk of over diagnosis. Yes Discussion included meaning and risk of False Positives. Yes Discussion included meaning of total radiation exposure. Yes  Counseling Included: Importance of adherence to annual lung cancer LDCT screening. Yes Impact of comorbidities on ability to participate in the program. Yes Ability and willingness to under diagnostic treatment: Yes  Smoking Cessation Counseling: Current Smokers:  Discussed importance of smoking cessation. Yes Information about tobacco cessation classes and interventions provided to patient. Yes Symptomatic Patient. No Diagnosis Code: Tobacco Use Z72.0 Asymptomatic Patient  Yes  Counseling (Intermediate counseling: > three minutes counseling) H9563 Information about tobacco cessation classes and interventions provided to patient. Yes Written Order for Lung Cancer Screening with LDCT placed in Epic. Yes (CT Chest Lung Cancer Screening Low Dose W/O CM) [id number] Z12.2-Screening of respiratory organs Z87.891-Personal history of nicotine dependence   I have spent 25 minutes of face to face/ virtual visit  time with the patient discussing the risks and benefits of lung cancer screening. We discussed the above noted topics. We paused at intervals to allow for questions to be asked and answered to ensure understanding.We discussed that the single most powerful action that anyone can take to decrease their risk of developing lung cancer is to quit smoking.  We discussed options for tools to aid in quitting smoking including nicotine replacement therapy, non-nicotine medications, support groups, Quit Smart classes, and behavior modification. We discussed that often times setting smaller, more achievable goals, such as eliminating 1 cigarette a day for a week and then 2 cigarettes a day for a week can be helpful in slowly decreasing the number of cigarettes smoked. I provided  them  with smoking cessation  information  with contact information for community resources, classes, free nicotine replacement therapy, and access to mobile apps, text messaging, and on-line smoking cessation help. I have also provided  them  the office contact information in the event they have any questions. We discussed the time and location of the scan, and that either Karna Curly RN, Karna Doom, RN  or I will call / send a letter with the results within 24-72 hours of receiving them. The patient verbalized understanding of all of  the above and had no further questions upon leaving the office. They have my contact information in the event they have any further  questions.  I spent 3 minutes counseling  on smoking cessation and the health risks of continued tobacco abuse.  I explained to the patient that there has been a high incidence of coronary artery disease noted on these exams. I explained that this is a non-gated exam therefore degree or severity cannot be determined. This patient is on statin therapy. I have asked the patient to follow-up with their PCP regarding any incidental finding of coronary artery disease and management with diet or medication as their PCP  feels is clinically indicated. The patient verbalized understanding of the above and had no further questions upon completion of the visit.    Leita SAUNDERS Sebasthian Stailey, PA-C

## 2023-12-30 NOTE — Patient Instructions (Signed)

## 2023-12-31 ENCOUNTER — Ambulatory Visit (HOSPITAL_COMMUNITY)
Admission: RE | Admit: 2023-12-31 | Discharge: 2023-12-31 | Disposition: A | Discharge status: Home / Self Care | Source: Ambulatory Visit | Attending: Nurse Practitioner | Admitting: Nurse Practitioner

## 2023-12-31 DIAGNOSIS — Z122 Encounter for screening for malignant neoplasm of respiratory organs: Secondary | ICD-10-CM | POA: Diagnosis present

## 2023-12-31 DIAGNOSIS — F17210 Nicotine dependence, cigarettes, uncomplicated: Secondary | ICD-10-CM | POA: Insufficient documentation

## 2023-12-31 DIAGNOSIS — Z87891 Personal history of nicotine dependence: Secondary | ICD-10-CM

## 2024-01-08 ENCOUNTER — Other Ambulatory Visit: Payer: Self-pay | Admitting: Acute Care

## 2024-01-08 DIAGNOSIS — F17210 Nicotine dependence, cigarettes, uncomplicated: Secondary | ICD-10-CM

## 2024-01-08 DIAGNOSIS — Z87891 Personal history of nicotine dependence: Secondary | ICD-10-CM

## 2024-01-08 DIAGNOSIS — Z122 Encounter for screening for malignant neoplasm of respiratory organs: Secondary | ICD-10-CM

## 2024-03-27 ENCOUNTER — Other Ambulatory Visit: Payer: Self-pay | Admitting: Nurse Practitioner

## 2024-03-27 DIAGNOSIS — M1990 Unspecified osteoarthritis, unspecified site: Secondary | ICD-10-CM

## 2024-03-27 DIAGNOSIS — E785 Hyperlipidemia, unspecified: Secondary | ICD-10-CM

## 2024-03-30 ENCOUNTER — Other Ambulatory Visit: Payer: Self-pay | Admitting: Nurse Practitioner

## 2024-03-30 DIAGNOSIS — I10 Essential (primary) hypertension: Secondary | ICD-10-CM

## 2024-03-30 DIAGNOSIS — F5101 Primary insomnia: Secondary | ICD-10-CM

## 2024-03-30 DIAGNOSIS — E785 Hyperlipidemia, unspecified: Secondary | ICD-10-CM

## 2024-03-30 MED ORDER — ATORVASTATIN CALCIUM 40 MG PO TABS: 40.0000 mg | ORAL_TABLET | Freq: Every day | ORAL | 1 refills | Status: AC

## 2024-03-30 MED ORDER — ZOLPIDEM TARTRATE 10 MG PO TABS: 10.0000 mg | ORAL_TABLET | Freq: Every evening | ORAL | 1 refills | Status: AC | PRN

## 2024-03-30 MED ORDER — AMLODIPINE BESYLATE 5 MG PO TABS: 5.0000 mg | ORAL_TABLET | Freq: Every day | ORAL | 1 refills | Status: DC

## 2024-03-30 MED ORDER — FENOFIBRATE 145 MG PO TABS: 145.0000 mg | ORAL_TABLET | Freq: Every day | ORAL | 1 refills | Status: AC

## 2024-04-14 ENCOUNTER — Other Ambulatory Visit: Payer: Self-pay | Admitting: Nurse Practitioner

## 2024-04-14 DIAGNOSIS — Z1231 Encounter for screening mammogram for malignant neoplasm of breast: Secondary | ICD-10-CM

## 2024-04-29 ENCOUNTER — Encounter: Payer: Self-pay | Admitting: Nurse Practitioner

## 2024-04-29 ENCOUNTER — Ambulatory Visit: Admitting: Nurse Practitioner

## 2024-04-29 VITALS — BP 157/93 | HR 75 | Temp 97.3°F | Ht 61.0 in | Wt 132.0 lb

## 2024-04-29 DIAGNOSIS — S76211A Strain of adductor muscle, fascia and tendon of right thigh, initial encounter: Secondary | ICD-10-CM | POA: Diagnosis not present

## 2024-04-29 MED ORDER — METHYLPREDNISOLONE ACETATE 80 MG/ML IJ SUSP
80.0000 mg | Freq: Once | INTRAMUSCULAR | Status: AC
  Administered 2024-04-29: 80 mg via INTRAMUSCULAR

## 2024-04-29 MED ORDER — PREDNISONE 20 MG PO TABS: ORAL_TABLET | ORAL | 0 refills | Status: DC

## 2024-04-29 MED ORDER — KETOROLAC TROMETHAMINE 60 MG/2ML IM SOLN
60.0000 mg | Freq: Once | INTRAMUSCULAR | Status: AC
  Administered 2024-04-29: 60 mg via INTRAMUSCULAR

## 2024-04-29 NOTE — Patient Instructions (Signed)
Adductor Muscle Strain  An adductor muscle strain, also called a groin strain or pull, is an injury to the muscles or tendons on the upper, inner part of the thigh. These muscles are called the adductor or groin muscles. They are responsible for moving the legs across the body or pulling the legs together. A muscle strain occurs when a muscle is overstretched and some muscle fibers are torn. The severity of an adductor muscle strain is rated as Grade 1, 2, or 3. A Grade 3 strain has the most tearing and pain. What are the causes? Adductor muscle strains usually occur during exercise or while participating in sports. This condition may be caused by: A sudden, violent force placed on the muscle, stretching it too far. Stretching the muscles too far or too suddenly, often during side-to-side motion with a sudden change in direction. Putting repeated stress on the adductor muscles over a long period of time. Performing vigorous activity without properly stretching or warming up the adductor muscles beforehand. Not being properly conditioned. What are the signs or symptoms? Symptoms of this condition include: Pain and tenderness in the groin area. This begins as sharp pain and persists as a dull ache. A popping or snapping feeling when the injury occurs (for severe strains). Swelling or bruising. Muscle spasms. Weakness in the leg. Stiffness in the groin area with decreased ability to move the affected muscles. How is this diagnosed? This condition may be diagnosed based on: A physical exam. Your medical history. How well you can do certain range of motion exercises. Imaging tests, such as MRI, ultrasound, or X-rays. Your strain may be rated based on how severe it is. The ratings are: Grade 1 strain (mild). Muscles are overstretched. There may be very small muscle tears. This type of strain generally heals in about one week. Grade 2 strain (moderate). Muscles are partially torn. This may take  one to two months to heal. Grade 3 strain (severe). Muscles are completely torn. A severe strain can take more than three months to heal. Grade 3 gluteal strains are rare. How is this treated? An adductor strain will often heal on its own. If needed, this condition may be treated with: PRICE therapy. PRICE stands for protection of the injured area, rest, ice, pressure (compression), and elevation. Medicines to help manage pain and swelling (anti-inflammatory medicines). Crutches. You may be directed to use these for the first few days to minimize your pain. Depending on the severity of the muscle strain, recovery time may vary from a few weeks to several months. Severe injuries often require 4-6 weeks for recovery. In those cases, complete healing can take 4-5 months. Follow these instructions at home: PRICE Therapy  Protect the muscle from being injured again. Rest. Do not use the strained muscle if it causes pain. If directed, put ice on the injured area: Put ice in a plastic bag. Place a towel between your skin and the bag. Leave the ice on for 20 minutes, 2-3 times a day. Do this for the first 2 days after the injury. Apply compression by wrapping the injured area with an elastic bandage as told by your health care provider. Raise (elevate) the injured area above the level of your heart while you are sitting or lying down. Activity Do not drive or use heavy machinery while taking prescription pain medicine. Walk, stretch, and do exercises as told by your health care provider. Only do these activities if you can do so without any pain. General instructions  Take over-the-counter and prescription medicines only as told by your health care provider. Follow your treatment plan as told by your health care provider. This may include: Physical therapy. Massage. Local electrical stimulation (transcutaneous electrical nerve stimulation, TENS). Keep all follow-up visits. This is important. How  is this prevented? Warm up and stretch before being active. Cool down and stretch after being active. Give your body time to rest between periods of activity. Make sure to use equipment that fits you. Be safe and responsible while being active to avoid slips and falls. Maintain physical fitness, including: Proper conditioning in the adductor muscles. Overall strength, flexibility, and endurance. Contact a health care provider if: You have increased pain or swelling in the affected area. Your symptoms are not improving or they are getting worse. Summary An adductor muscle strain, also called a groin strain or pull, is an injury to the muscles or tendons on the upper, inner part of the thigh. A muscle strain occurs when a muscle is overstretched and some muscle fibers are torn. Depending on the severity of the muscle strain, recovery time may vary from a few weeks to several months. This information is not intended to replace advice given to you by your health care provider. Make sure you discuss any questions you have with your health care provider. Document Revised: 10/28/2022 Document Reviewed: 11/22/2020 Elsevier Patient Education  2024 [employer].

## 2024-04-29 NOTE — Progress Notes (Signed)
   Subjective:    Patient ID: [name], female    DOB: [birth year], 63 y.o.   MRN: [id number]  Chief Complaint: Right side groin pain   HPI  Pain started several weeks ago. Goes from midback down to right buttocks and in right groin area around. Rates pain 8/10 currently. Standing and walking increases pain sitting still helps. Patient Active Problem List   Diagnosis Date Noted   Lipoma of flank 08/05/2023   Primary insomnia 05/09/2021   Spinal stenosis of cervical region 04/11/2020   Lumbar spondylosis 04/07/2020   Occipital neuralgia 02/23/2020   Vitamin D  deficiency 12/12/2014   Migraines 10/15/2012   Essential hypertension, benign 10/15/2012   Hyperlipidemia with target LDL less than 100 10/15/2012       Review of Systems  Constitutional:  Negative for diaphoresis.  Eyes:  Negative for pain.  Respiratory:  Negative for shortness of breath.   Cardiovascular:  Negative for chest pain, palpitations and leg swelling.  Gastrointestinal:  Negative for abdominal pain.  Endocrine: Negative for polydipsia.  Skin:  Negative for rash.  Neurological:  Negative for dizziness, weakness and headaches.  Hematological:  Does not bruise/bleed easily.  All other systems reviewed and are negative.      Objective:   Physical Exam Constitutional:      Appearance: Normal appearance.  Cardiovascular:     Rate and Rhythm: Normal rate and regular rhythm.     Heart sounds: Normal heart sounds.  Pulmonary:     Effort: Pulmonary effort is normal.     Breath sounds: Normal breath sounds.  Musculoskeletal:     Comments: FROM of right lumbar with  no pain FROM of right hip with pain in right groin on abduction. Motor strength and sensation distally intact.  Skin:    General: Skin is warm.  Neurological:     General: No focal deficit present.     Mental Status: She is alert and oriented to person, place, and time.  Psychiatric:        Mood and Affect: Mood normal.         Behavior: Behavior normal.     BP (!) 157/93   Pulse 75   Temp (!) 97.3 F (36.3 C) (Temporal)   Ht 5' 1 (1.549 m)   Wt 132 lb (59.9 kg)   LMP 07/17/2012   SpO2 95%   BMI 24.94 kg/m        Assessment & Plan:   [name] in today with chief complaint of Right side groin pain   1. Strain of groin, right, initial encounter (Primary) Moist heat  Rest RTO prn - methylPREDNISolone acetate (DEPO-MEDROL) injection 80 mg - ketorolac  (TORADOL ) injection 60 mg - predniSONE  (DELTASONE ) 20 MG tablet; 2 po at sametime daily for 5 days-  Dispense: 10 tablet; Refill: 0    The above assessment and management plan was discussed with the patient. The patient verbalized understanding of and has agreed to the management plan. Patient is aware to call the clinic if symptoms persist or worsen. Patient is aware when to return to the clinic for a follow-up visit. Patient educated on when it is appropriate to go to the emergency department.   Mary-Margaret Gladis, FNP

## 2024-05-18 ENCOUNTER — Ambulatory Visit: Payer: Self-pay | Admitting: Nurse Practitioner

## 2024-05-18 ENCOUNTER — Encounter: Payer: Self-pay | Admitting: Nurse Practitioner

## 2024-05-18 VITALS — BP 135/84 | HR 89 | Temp 97.7°F | Ht 61.0 in | Wt 132.0 lb

## 2024-05-18 DIAGNOSIS — E785 Hyperlipidemia, unspecified: Secondary | ICD-10-CM

## 2024-05-18 DIAGNOSIS — Z0184 Encounter for antibody response examination: Secondary | ICD-10-CM

## 2024-05-18 DIAGNOSIS — F5101 Primary insomnia: Secondary | ICD-10-CM

## 2024-05-18 DIAGNOSIS — I10 Essential (primary) hypertension: Secondary | ICD-10-CM

## 2024-05-18 DIAGNOSIS — M4802 Spinal stenosis, cervical region: Secondary | ICD-10-CM

## 2024-05-18 DIAGNOSIS — E559 Vitamin D deficiency, unspecified: Secondary | ICD-10-CM | POA: Diagnosis not present

## 2024-05-18 LAB — LIPID PANEL

## 2024-05-18 MED ORDER — AMLODIPINE BESYLATE 10 MG PO TABS: 10.0000 mg | ORAL_TABLET | Freq: Every day | ORAL | 1 refills | Status: AC

## 2024-05-18 NOTE — Patient Instructions (Signed)

## 2024-05-18 NOTE — Progress Notes (Signed)
 Subjective:    Patient ID: [name], female    DOB: [birth year], 63 y.o.   MRN: [id number]   Chief Complaint: medical management of chronic issues     HPI:  [name] is a 63 y.o. who identifies as a female who was assigned female at birth.   Social history: Lives with: her son lives with her Work history: retired   Water Engineer in today for follow up of the following chronic medical issues:  1. Essential hypertension, benign No c/o chest pain, sob or headache. Does not check blood pressure at home. BP Readings from Last 3 Encounters:  04/29/24 (!) 157/93  11/18/23 129/79  11/12/23 114/72     2. Hyperlipidemia with target LDL less than 100 Does try to watch diet does no exercise. Lab Results  Component Value Date   CHOL 136 11/07/2022   HDL 27 (L) 11/07/2022   LDLCALC 77 11/07/2022   TRIG 185 (H) 11/07/2022   CHOLHDL 5.0 (H) 11/07/2022     3. Primary insomnia Is on ambien  and is not able to sleep well without meds  4. Vitamin D  deficiency Is on daily viatmin d supplement  5. Spinal stenosis of cervical region Has chronic back pain. Is on flexeril  and celebrex  which usually help.   New complaints: None today  Allergies  Allergen Reactions   Baclofen  Nausea And Vomiting   Outpatient Encounter Medications as of 05/18/2024  Medication Sig   amLODipine  (NORVASC ) 5 MG tablet Take 1 tablet (5 mg total) by mouth daily.   aspirin 81 MG chewable tablet Chew 81 mg by mouth daily.   atorvastatin  (LIPITOR) 40 MG tablet Take 1 tablet (40 mg total) by mouth daily.   celecoxib  (CELEBREX ) 200 MG capsule Take 1 capsule by mouth twice daily   cyclobenzaprine  (FLEXERIL ) 10 MG tablet Take 1 tablet (10 mg total) by mouth 3 (three) times daily as needed for muscle spasms.   fenofibrate  (TRICOR ) 145 MG tablet Take 1 tablet (145 mg total) by mouth daily.   ibuprofen (ADVIL) 200 MG tablet Take 800 mg by mouth every 8 (eight) hours as needed.   montelukast   (SINGULAIR ) 10 MG tablet TAKE 1 TABLET BY MOUTH AT BEDTIME   polyethylene glycol (MIRALAX / GLYCOLAX) 17 g packet Take 17 g by mouth daily as needed.   predniSONE  (DELTASONE ) 20 MG tablet 2 po at sametime daily for 5 days-   psyllium (REGULOID) 0.52 g capsule Take 4 capsules by mouth 2 (two) times daily.   zolpidem  (AMBIEN ) 10 MG tablet Take 1 tablet (10 mg total) by mouth at bedtime as needed. for sleep   No facility-administered encounter medications on file as of 05/18/2024.    Past Surgical History:  Procedure Laterality Date   CHOLECYSTECTOMY     FOOT SURGERY Right 2017   ligament   SHOULDER ARTHROSCOPY WITH ROTATOR CUFF REPAIR Right 2021   TUBAL LIGATION      Family History  Problem Relation Age of Onset   Diabetes Mother    Heart disease Father    Diabetes Father    Healthy Sister    Healthy Sister    Healthy Brother    Breast cancer Other    Colon cancer Neg Hx    Esophageal cancer Neg Hx    Stomach cancer Neg Hx    Rectal cancer Neg Hx       Controlled substance contract: n/a     Review of Systems  Constitutional:  Negative for diaphoresis.  Eyes:  Negative for pain.  Respiratory:  Negative for shortness of breath.   Cardiovascular:  Negative for chest pain, palpitations and leg swelling.  Gastrointestinal:  Negative for abdominal pain.  Endocrine: Negative for polydipsia.  Skin:  Negative for rash.  Neurological:  Negative for dizziness, weakness and headaches.  Hematological:  Does not bruise/bleed easily.  All other systems reviewed and are negative.      Objective:   Physical Exam Vitals and nursing note reviewed.  Constitutional:      General: She is not in acute distress.    Appearance: Normal appearance. She is well-developed.  HENT:     Head: Normocephalic.     Right Ear: Tympanic membrane normal.     Left Ear: Tympanic membrane normal.     Nose: Nose normal.     Mouth/Throat:     Mouth: Mucous membranes are moist.  Eyes:      Pupils: Pupils are equal, round, and reactive to light.  Neck:     Vascular: No carotid bruit or JVD.  Cardiovascular:     Rate and Rhythm: Normal rate and regular rhythm.     Heart sounds: Normal heart sounds.  Pulmonary:     Effort: Pulmonary effort is normal. No respiratory distress.     Breath sounds: Normal breath sounds. No wheezing or rales.  Chest:     Chest wall: No tenderness.  Abdominal:     General: Bowel sounds are normal. There is no distension or abdominal bruit.     Palpations: Abdomen is soft. There is no hepatomegaly, splenomegaly, mass or pulsatile mass.     Tenderness: There is no abdominal tenderness.  Musculoskeletal:        General: Normal range of motion.     Cervical back: Normal range of motion and neck supple.  Lymphadenopathy:     Cervical: No cervical adenopathy.  Skin:    General: Skin is warm and dry.  Neurological:     Mental Status: She is alert and oriented to person, place, and time.     Deep Tendon Reflexes: Reflexes are normal and symmetric.  Psychiatric:        Behavior: Behavior normal.        Thought Content: Thought content normal.        Judgment: Judgment normal.     BP 135/84   Pulse 89   Temp 97.7 F (36.5 C) (Temporal)   Ht 5' 1 (1.549 m)   Wt 132 lb (59.9 kg)   LMP 07/17/2012   SpO2 99%   BMI 24.94 kg/m    LMP 07/17/2012    Assessment & Plan:   [name] comes in today with chief complaint of medical management of chronic issues    Diagnosis and orders addressed:  1. Essential hypertension, benign Increased amlodipine  to 10mg  daily - EKG 12-Lead - amLODipine  (NORVASC ) 10 MG tablet; Take 1 tablet (510 mg total) by mouth daily.  Dispense: 90 tablet; Refill: 1 - CBC with Differential/Platelet - CMP14+EGFR  2. Hyperlipidemia with target LDL less than 100 Low fat diet - atorvastatin  (LIPITOR) 40 MG tablet; Take 1 tablet (40 mg total) by mouth daily.  Dispense: 90 tablet; Refill: 1 - fenofibrate  (TRICOR )  145 MG tablet; Take 1 tablet (145 mg total) by mouth daily.  Dispense: 90 tablet; Refill: 1 - Lipid panel  3. Primary insomnia Bedtime routine - zolpidem  (AMBIEN ) 10 MG tablet; Take 1 tablet (10 mg total) by mouth at bedtime as needed. for  sleep  Dispense: 90 tablet; Refill: 1  4. Vitamin D  deficiency Daily vitamin d  supplement  5. Spinal stenosis of cervical region Flexeril  as needed   Labs pending Health Maintenance reviewed Diet and exercise encouraged  Follow up plan: 6 months   Mary-Margaret Gladis, FNP

## 2024-05-19 LAB — CBC WITH DIFFERENTIAL/PLATELET
Basophils Absolute: 0.1 x10E3/uL (ref 0.0–0.2)
Basos: 1 %
EOS (ABSOLUTE): 0.2 x10E3/uL (ref 0.0–0.4)
Eos: 3 %
Hematocrit: 48.2 % — ABNORMAL HIGH (ref 34.0–46.6)
Hemoglobin: 16 g/dL — ABNORMAL HIGH (ref 11.1–15.9)
Immature Grans (Abs): 0 x10E3/uL (ref 0.0–0.1)
Immature Granulocytes: 0 %
Lymphocytes Absolute: 2.3 x10E3/uL (ref 0.7–3.1)
Lymphs: 30 %
MCH: 30.4 pg (ref 26.6–33.0)
MCHC: 33.2 g/dL (ref 31.5–35.7)
MCV: 92 fL (ref 79–97)
Monocytes Absolute: 0.5 x10E3/uL (ref 0.1–0.9)
Monocytes: 7 %
Neutrophils Absolute: 4.6 x10E3/uL (ref 1.4–7.0)
Neutrophils: 59 %
Platelets: 264 x10E3/uL (ref 150–450)
RBC: 5.27 x10E6/uL (ref 3.77–5.28)
RDW: 12.7 % (ref 11.7–15.4)
WBC: 7.7 x10E3/uL (ref 3.4–10.8)

## 2024-05-19 LAB — CMP14+EGFR
ALT: 29 IU/L (ref 0–32)
AST: 33 IU/L (ref 0–40)
Albumin: 4.4 g/dL (ref 3.9–4.9)
Alkaline Phosphatase: 112 IU/L (ref 49–135)
BUN/Creatinine Ratio: 14 (ref 12–28)
BUN: 10 mg/dL (ref 8–27)
Bilirubin Total: 0.4 mg/dL (ref 0.0–1.2)
CO2: 24 mmol/L (ref 20–29)
Calcium: 10.3 mg/dL (ref 8.7–10.3)
Chloride: 106 mmol/L (ref 96–106)
Creatinine, Ser: 0.71 mg/dL (ref 0.57–1.00)
Globulin, Total: 2.6 g/dL (ref 1.5–4.5)
Glucose: 78 mg/dL (ref 70–99)
Potassium: 3.8 mmol/L (ref 3.5–5.2)
Sodium: 144 mmol/L (ref 134–144)
Total Protein: 7 g/dL (ref 6.0–8.5)
eGFR: 96 mL/min/1.73 (ref >59)

## 2024-05-19 LAB — LIPID PANEL
Cholesterol, Total: 162 mg/dL (ref 100–199)
HDL: 40 mg/dL (ref >39)
LDL CALC COMMENT:: 4.1 ratio (ref 0.0–4.4)
LDL Chol Calc (NIH): 107 mg/dL — AB (ref 0–99)
Triglycerides: 79 mg/dL (ref 0–149)
VLDL Cholesterol Cal: 15 mg/dL (ref 5–40)

## 2024-05-19 LAB — HEPATITIS B SURFACE ANTIBODY, QUANTITATIVE: Hepatitis B Surf Ab Quant: 3.8 m[IU]/mL — AB

## 2024-05-20 ENCOUNTER — Ambulatory Visit: Payer: Self-pay | Admitting: Nurse Practitioner

## 2024-06-02 ENCOUNTER — Ambulatory Visit
Admission: RE | Admit: 2024-06-02 | Discharge: 2024-06-02 | Disposition: A | Discharge status: Home / Self Care | Source: Ambulatory Visit | Attending: Nurse Practitioner | Admitting: Nurse Practitioner

## 2024-06-02 DIAGNOSIS — Z1231 Encounter for screening mammogram for malignant neoplasm of breast: Secondary | ICD-10-CM

## 2024-08-13 ENCOUNTER — Encounter: Payer: Self-pay | Admitting: Nurse Practitioner

## 2024-08-13 ENCOUNTER — Ambulatory Visit (INDEPENDENT_AMBULATORY_CARE_PROVIDER_SITE_OTHER): Admitting: Nurse Practitioner

## 2024-08-13 VITALS — BP 130/80 | HR 88 | Ht 61.0 in | Wt 133.0 lb

## 2024-08-13 DIAGNOSIS — H60501 Unspecified acute noninfective otitis externa, right ear: Secondary | ICD-10-CM

## 2024-08-13 MED ORDER — CIPROFLOXACIN-DEXAMETHASONE 0.3-0.1 % OT SUSP: 4.0000 [drp] | Freq: Two times a day (BID) | OTIC | 0 refills | Status: AC

## 2024-08-13 NOTE — Progress Notes (Signed)
" ° °  Subjective:    Patient ID: [name], female    DOB: 09-03-1960, 64 y.o.   MRN: [id number]   Chief Complaint: Ear Pain (right)   HPI  Patient in today c/o right ear pain. Started after she flew to [place] . She went to urgent care in [place]  and was prescribed an antibiotic. Pain resolved. But now it is itching and draining. Patient Active Problem List   Diagnosis Date Noted   Lipoma of flank 08/05/2023   Primary insomnia 05/09/2021   Spinal stenosis of cervical region 04/11/2020   Lumbar spondylosis 04/07/2020   Occipital neuralgia 02/23/2020   Vitamin D  deficiency 12/12/2014   Migraines 10/15/2012   Essential hypertension, benign 10/15/2012   Hyperlipidemia with target LDL less than 100 10/15/2012       Review of Systems  Constitutional:  Negative for diaphoresis.  Eyes:  Negative for pain.  Respiratory:  Negative for shortness of breath.   Cardiovascular:  Negative for chest pain, palpitations and leg swelling.  Gastrointestinal:  Negative for abdominal pain.  Endocrine: Negative for polydipsia.  Skin:  Negative for rash.  Neurological:  Negative for dizziness, weakness and headaches.  Hematological:  Does not bruise/bleed easily.  All other systems reviewed and are negative.      Objective:   Physical Exam Constitutional:      Appearance: Normal appearance.  HENT:     Right Ear: Tympanic membrane normal.     Left Ear: Tympanic membrane normal.     Ears:     Comments: Right ear canal erythematous and dry appearing Cardiovascular:     Rate and Rhythm: Normal rate and regular rhythm.     Heart sounds: Normal heart sounds.  Pulmonary:     Effort: Pulmonary effort is normal.     Breath sounds: Normal breath sounds.  Skin:    General: Skin is warm.  Neurological:     General: No focal deficit present.     Mental Status: She is alert and oriented to person, place, and time.  Psychiatric:        Mood and Affect: Mood normal.        Behavior:  Behavior normal.     BP 130/80   Pulse 88   Ht 5' 1 (1.549 m)   Wt 133 lb (60.3 kg)   LMP 07/17/2012   SpO2 96%   BMI 25.13 kg/m        Assessment & Plan:   [name] in today with chief complaint of Ear Pain (right)   1. Acute otitis externa of right ear, unspecified type (Primary) Avoid sticking anything in ear Avoid getting water in ear RTOprn - ciprofloxacin -dexamethasone  (CIPRODEX ) OTIC suspension; Place 4 drops into the right ear 2 (two) times daily.  Dispense: 7.5 mL; Refill: 0    The above assessment and management plan was discussed with the patient. The patient verbalized understanding of and has agreed to the management plan. Patient is aware to call the clinic if symptoms persist or worsen. Patient is aware when to return to the clinic for a follow-up visit. Patient educated on when it is appropriate to go to the emergency department.   Mary-Margaret Gladis, FNP   "

## 2024-08-13 NOTE — Patient Instructions (Signed)
Otitis Externa  Otitis externa is an infection of the outer ear canal. The outer ear canal is the area between the outside of the ear and the eardrum. Otitis externa is sometimes called swimmer's ear. What are the causes? Common causes of this condition include: Swimming in dirty water. Moisture in the ear. An injury to the inside of the ear. An object stuck in the ear. A cut or scrape on the outside of the ear or in the ear canal. What increases the risk? You are more likely to get this condition if you go swimming often. What are the signs or symptoms? Itching in the ear. This is often the first symptom. Swelling of the ear. Redness in the ear. Ear pain. The pain may get worse when you pull on your ear. Pus coming from the ear. How is this treated? This condition may be treated with: Antibiotic ear drops. These are often given for 10-14 days. Medicines to reduce itching and swelling. Follow these instructions at home: If you were prescribed antibiotic ear drops, use them as told by your doctor. Do not stop using them even if you start to feel better. Take over-the-counter and prescription medicines only as told by your doctor. Avoid getting water in your ears as told by your doctor. You may be told to avoid swimming or water sports for a few days. Keep all follow-up visits. How is this prevented? Keep your ears dry. Use the corner of a towel to dry your ears after you swim or bathe. Try not to scratch or put things in your ear. Doing these things makes it easier for germs to grow in your ear. Avoid swimming in lakes, dirty water, or swimming pools that may not have the right amount of a chemical called chlorine. Contact a doctor if: You have a fever. Your ear is still red, swollen, or painful after 3 days. You still have pus coming from your ear after 3 days. Your redness, swelling, or pain gets worse. You have a very bad headache. Get help right away if: You have redness,  swelling, and pain or tenderness behind your ear. Summary Otitis externa is an infection of the outer ear canal. Symptoms include pain, redness, and swelling of the ear. If you were prescribed antibiotic ear drops, use them as told by your doctor. Do not stop using them even if you start to feel better. Try not to scratch or put things in your ear. This information is not intended to replace advice given to you by your health care provider. Make sure you discuss any questions you have with your health care provider. Document Revised: 09/06/2020 Document Reviewed: 09/06/2020 Elsevier Patient Education  2024 [employer]  

## 2024-11-09 ENCOUNTER — Ambulatory Visit: Admitting: Nurse Practitioner

## 2024-11-11 ENCOUNTER — Encounter

## 2024-11-12 ENCOUNTER — Ambulatory Visit: Admitting: Nurse Practitioner
